# Patient Record
Sex: Female | Born: 1950 | Race: Black or African American | Hispanic: No | Marital: Married | Smoking: Never smoker
Health system: Southern US, Community
[De-identification: ages and names within clinical notes are randomized; demographics above are authoritative.]

## PROBLEM LIST (undated history)

## (undated) DIAGNOSIS — E1169 Type 2 diabetes mellitus with other specified complication: Secondary | ICD-10-CM

## (undated) DIAGNOSIS — I639 Cerebral infarction, unspecified: Secondary | ICD-10-CM

## (undated) DIAGNOSIS — I493 Ventricular premature depolarization: Secondary | ICD-10-CM

## (undated) DIAGNOSIS — I2510 Atherosclerotic heart disease of native coronary artery without angina pectoris: Secondary | ICD-10-CM

## (undated) DIAGNOSIS — N189 Chronic kidney disease, unspecified: Secondary | ICD-10-CM

## (undated) DIAGNOSIS — I10 Essential (primary) hypertension: Secondary | ICD-10-CM

## (undated) DIAGNOSIS — E669 Obesity, unspecified: Secondary | ICD-10-CM

## (undated) DIAGNOSIS — I4729 Other ventricular tachycardia: Secondary | ICD-10-CM

## (undated) DIAGNOSIS — E785 Hyperlipidemia, unspecified: Secondary | ICD-10-CM

## (undated) DIAGNOSIS — Z9861 Coronary angioplasty status: Secondary | ICD-10-CM

## (undated) DIAGNOSIS — K219 Gastro-esophageal reflux disease without esophagitis: Secondary | ICD-10-CM

## (undated) DIAGNOSIS — IMO0001 Reserved for inherently not codable concepts without codable children: Secondary | ICD-10-CM

## (undated) DIAGNOSIS — I209 Angina pectoris, unspecified: Secondary | ICD-10-CM

## (undated) DIAGNOSIS — I472 Ventricular tachycardia: Secondary | ICD-10-CM

## (undated) DIAGNOSIS — I2119 ST elevation (STEMI) myocardial infarction involving other coronary artery of inferior wall: Secondary | ICD-10-CM

## (undated) DIAGNOSIS — R0601 Orthopnea: Secondary | ICD-10-CM

## (undated) DIAGNOSIS — D219 Benign neoplasm of connective and other soft tissue, unspecified: Secondary | ICD-10-CM

## (undated) HISTORY — DX: Ventricular premature depolarization: I49.3

## (undated) HISTORY — PX: CHOLECYSTECTOMY: SHX55

## (undated) HISTORY — DX: Obesity, unspecified: E66.9

## (undated) HISTORY — DX: Hyperlipidemia, unspecified: E78.5

## (undated) HISTORY — DX: Other ventricular tachycardia: I47.29

## (undated) HISTORY — DX: Type 2 diabetes mellitus with other specified complication: E11.69

## (undated) HISTORY — PX: ABDOMINAL HYSTERECTOMY: SHX81

## (undated) HISTORY — DX: Ventricular tachycardia: I47.2

## (undated) HISTORY — DX: ST elevation (STEMI) myocardial infarction involving other coronary artery of inferior wall: I21.19

---

## 2004-12-02 ENCOUNTER — Ambulatory Visit: Payer: Self-pay | Admitting: Family Medicine

## 2005-11-24 ENCOUNTER — Ambulatory Visit: Payer: Self-pay | Admitting: Internal Medicine

## 2006-01-08 ENCOUNTER — Ambulatory Visit: Payer: Self-pay | Admitting: Internal Medicine

## 2006-11-04 ENCOUNTER — Ambulatory Visit: Payer: Self-pay | Admitting: Internal Medicine

## 2007-03-01 ENCOUNTER — Ambulatory Visit: Payer: Self-pay | Admitting: Internal Medicine

## 2007-04-22 ENCOUNTER — Ambulatory Visit: Payer: Self-pay | Admitting: Internal Medicine

## 2007-04-27 ENCOUNTER — Emergency Department: Payer: Self-pay | Admitting: Emergency Medicine

## 2008-05-01 ENCOUNTER — Ambulatory Visit: Payer: Self-pay | Admitting: Unknown Physician Specialty

## 2008-05-24 ENCOUNTER — Ambulatory Visit: Payer: Self-pay | Admitting: Unknown Physician Specialty

## 2009-03-18 ENCOUNTER — Observation Stay: Payer: Self-pay | Admitting: Internal Medicine

## 2009-07-23 ENCOUNTER — Ambulatory Visit: Payer: Self-pay | Admitting: Internal Medicine

## 2009-09-28 ENCOUNTER — Ambulatory Visit: Payer: Self-pay | Admitting: Surgery

## 2009-10-15 ENCOUNTER — Ambulatory Visit: Payer: Self-pay | Admitting: Surgery

## 2010-07-12 ENCOUNTER — Encounter: Payer: Self-pay | Admitting: Cardiothoracic Surgery

## 2010-08-07 ENCOUNTER — Encounter: Payer: Self-pay | Admitting: Cardiothoracic Surgery

## 2011-07-22 ENCOUNTER — Ambulatory Visit: Payer: Self-pay | Admitting: Family Medicine

## 2012-08-13 ENCOUNTER — Ambulatory Visit: Payer: Self-pay | Admitting: Family Medicine

## 2013-03-19 ENCOUNTER — Inpatient Hospital Stay (HOSPITAL_COMMUNITY)
Admission: EM | Admit: 2013-03-19 | Discharge: 2013-03-21 | DRG: 247 | Disposition: A | Discharge status: Home / Self Care | Payer: 59 | Attending: Cardiovascular Disease | Admitting: Cardiovascular Disease

## 2013-03-19 ENCOUNTER — Encounter (HOSPITAL_COMMUNITY)
Admission: EM | Disposition: A | Discharge status: Home / Self Care | Payer: 59 | Source: Home / Self Care | Attending: Cardiovascular Disease

## 2013-03-19 ENCOUNTER — Encounter (HOSPITAL_COMMUNITY): Payer: Self-pay | Admitting: Emergency Medicine

## 2013-03-19 ENCOUNTER — Emergency Department: Payer: Self-pay | Admitting: Emergency Medicine

## 2013-03-19 DIAGNOSIS — E119 Type 2 diabetes mellitus without complications: Secondary | ICD-10-CM | POA: Diagnosis present

## 2013-03-19 DIAGNOSIS — I2510 Atherosclerotic heart disease of native coronary artery without angina pectoris: Secondary | ICD-10-CM

## 2013-03-19 DIAGNOSIS — I2119 ST elevation (STEMI) myocardial infarction involving other coronary artery of inferior wall: Secondary | ICD-10-CM

## 2013-03-19 DIAGNOSIS — E669 Obesity, unspecified: Secondary | ICD-10-CM | POA: Diagnosis present

## 2013-03-19 DIAGNOSIS — I10 Essential (primary) hypertension: Secondary | ICD-10-CM | POA: Diagnosis present

## 2013-03-19 DIAGNOSIS — Z9861 Coronary angioplasty status: Secondary | ICD-10-CM

## 2013-03-19 DIAGNOSIS — I219 Acute myocardial infarction, unspecified: Secondary | ICD-10-CM

## 2013-03-19 DIAGNOSIS — I213 ST elevation (STEMI) myocardial infarction of unspecified site: Secondary | ICD-10-CM

## 2013-03-19 DIAGNOSIS — I214 Non-ST elevation (NSTEMI) myocardial infarction: Secondary | ICD-10-CM | POA: Insufficient documentation

## 2013-03-19 DIAGNOSIS — E876 Hypokalemia: Secondary | ICD-10-CM | POA: Diagnosis not present

## 2013-03-19 DIAGNOSIS — Z8673 Personal history of transient ischemic attack (TIA), and cerebral infarction without residual deficits: Secondary | ICD-10-CM

## 2013-03-19 DIAGNOSIS — E785 Hyperlipidemia, unspecified: Secondary | ICD-10-CM | POA: Diagnosis present

## 2013-03-19 DIAGNOSIS — Z79899 Other long term (current) drug therapy: Secondary | ICD-10-CM

## 2013-03-19 HISTORY — DX: Essential (primary) hypertension: I10

## 2013-03-19 HISTORY — DX: Atherosclerotic heart disease of native coronary artery without angina pectoris: I25.10

## 2013-03-19 HISTORY — DX: Cerebral infarction, unspecified: I63.9

## 2013-03-19 HISTORY — DX: Coronary angioplasty status: Z98.61

## 2013-03-19 HISTORY — DX: ST elevation (STEMI) myocardial infarction involving other coronary artery of inferior wall: I21.19

## 2013-03-19 HISTORY — PX: PERCUTANEOUS CORONARY STENT INTERVENTION (PCI-S): SHX6016

## 2013-03-19 HISTORY — DX: Atherosclerotic heart disease of native coronary artery without angina pectoris: Z98.61

## 2013-03-19 HISTORY — PX: LEFT HEART CATHETERIZATION WITH CORONARY ANGIOGRAM: SHX5451

## 2013-03-19 HISTORY — DX: Obesity, unspecified: E66.9

## 2013-03-19 LAB — COMPREHENSIVE METABOLIC PANEL
ALT: 26 U/L (ref 12–78)
ALT: 21 U/L (ref 0–35)
AST: 42 U/L — ABNORMAL HIGH (ref 0–37)
Albumin: 4 g/dL (ref 3.4–5.0)
Albumin: 3.9 g/dL (ref 3.5–5.2)
Alkaline Phosphatase: 103 U/L
Alkaline Phosphatase: 95 U/L (ref 39–117)
Anion Gap: 7 (ref 7–16)
BUN: 15 mg/dL (ref 7–18)
BUN: 14 mg/dL (ref 6–23)
Bilirubin,Total: 0.4 mg/dL (ref 0.2–1.0)
CHLORIDE: 105 mmol/L (ref 98–107)
CHLORIDE: 102 meq/L (ref 96–112)
CO2: 28 mmol/L (ref 21–32)
CO2: 25 mEq/L (ref 19–32)
Calcium, Total: 9 mg/dL (ref 8.5–10.1)
Calcium: 9.4 mg/dL (ref 8.4–10.5)
Creatinine, Ser: 0.88 mg/dL (ref 0.50–1.10)
Creatinine: 1.05 mg/dL (ref 0.60–1.30)
EGFR (African American): >60
GFR CALC NON AF AMER: 57 — AB
GFR calc non Af Amer: 69 mL/min — ABNORMAL LOW (ref >90)
GFR, EST AFRICAN AMERICAN: 81 mL/min — AB (ref >90)
Glucose, Bld: 155 mg/dL — ABNORMAL HIGH (ref 70–99)
Glucose: 151 mg/dL — ABNORMAL HIGH (ref 65–99)
Osmolality: 283 (ref 275–301)
POTASSIUM: 3.4 mmol/L — AB (ref 3.5–5.1)
POTASSIUM: 3.8 meq/L (ref 3.7–5.3)
SGOT(AST): 19 U/L (ref 15–37)
Sodium: 140 mmol/L (ref 136–145)
Sodium: 140 mEq/L (ref 137–147)
Total Bilirubin: 0.3 mg/dL (ref 0.3–1.2)
Total Protein: 8.1 g/dL (ref 6.4–8.2)
Total Protein: 7.4 g/dL (ref 6.0–8.3)

## 2013-03-19 LAB — CBC
HCT: 44.6 % (ref 35.0–47.0)
HCT: 41.3 % (ref 36.0–46.0)
HEMOGLOBIN: 14.6 g/dL (ref 12.0–15.0)
HGB: 15.2 g/dL (ref 12.0–16.0)
MCH: 28.7 pg (ref 26.0–34.0)
MCH: 29.1 pg (ref 26.0–34.0)
MCHC: 34 g/dL (ref 32.0–36.0)
MCHC: 35.4 g/dL (ref 30.0–36.0)
MCV: 84 fL (ref 80–100)
MCV: 82.4 fL (ref 78.0–100.0)
Platelet: 226 10*3/uL (ref 150–440)
Platelets: 238 10*3/uL (ref 150–400)
RBC: 5.29 10*6/uL — AB (ref 3.80–5.20)
RBC: 5.01 MIL/uL (ref 3.87–5.11)
RDW: 14.2 % (ref 11.5–14.5)
RDW: 13.5 % (ref 11.5–15.5)
WBC: 9.7 10*3/uL (ref 3.6–11.0)
WBC: 9.6 10*3/uL (ref 4.0–10.5)

## 2013-03-19 LAB — I-STAT CHEM 8, ED
BUN: 14 mg/dL (ref 6–23)
CALCIUM ION: 1.14 mmol/L (ref 1.13–1.30)
Chloride: 103 mEq/L (ref 96–112)
Creatinine, Ser: 1 mg/dL (ref 0.50–1.10)
Glucose, Bld: 152 mg/dL — ABNORMAL HIGH (ref 70–99)
HCT: 46 % (ref 36.0–46.0)
Hemoglobin: 15.6 g/dL — ABNORMAL HIGH (ref 12.0–15.0)
Potassium: 3.7 mEq/L (ref 3.7–5.3)
Sodium: 141 mEq/L (ref 137–147)
TCO2: 25 mmol/L (ref 0–100)

## 2013-03-19 LAB — PROTIME-INR
INR: 1.02 (ref 0.00–1.49)
Prothrombin Time: 13.2 seconds (ref 11.6–15.2)

## 2013-03-19 LAB — I-STAT TROPONIN, ED: TROPONIN I, POC: 1.47 ng/mL — AB (ref 0.00–0.08)

## 2013-03-19 LAB — TSH: TSH: 0.444 u[IU]/mL (ref 0.350–4.500)

## 2013-03-19 LAB — TROPONIN I
Troponin I: >20 ng/mL (ref <0.30)
Troponin-I: 0.05 ng/mL

## 2013-03-19 LAB — HEMOGLOBIN A1C
Hgb A1c MFr Bld: 6.5 % — ABNORMAL HIGH (ref <5.7)
Mean Plasma Glucose: 140 mg/dL — ABNORMAL HIGH (ref <117)

## 2013-03-19 LAB — MAGNESIUM: Magnesium: 2.1 mg/dL (ref 1.5–2.5)

## 2013-03-19 LAB — MRSA PCR SCREENING: MRSA by PCR: NEGATIVE

## 2013-03-19 LAB — T4, FREE: Free T4: 1.2 ng/dL (ref 0.80–1.80)

## 2013-03-19 LAB — APTT: APTT: 82 s — AB (ref 24–37)

## 2013-03-19 SURGERY — LEFT HEART CATHETERIZATION WITH CORONARY ANGIOGRAM

## 2013-03-19 MED ORDER — HEPARIN (PORCINE) IN NACL 2-0.9 UNIT/ML-% IJ SOLN
INTRAMUSCULAR | Status: AC
  Filled 2013-03-19: qty 1000

## 2013-03-19 MED ORDER — TICAGRELOR 90 MG PO TABS
90.0000 mg | ORAL_TABLET | Freq: Two times a day (BID) | ORAL | Status: DC
  Administered 2013-03-19 – 2013-03-21 (×4): 90 mg via ORAL
  Filled 2013-03-19 (×6): qty 1

## 2013-03-19 MED ORDER — SODIUM CHLORIDE 0.9 % IV SOLN
INTRAVENOUS | Status: DC
  Administered 2013-03-19: 20 mL/h via INTRAVENOUS

## 2013-03-19 MED ORDER — ASPIRIN 81 MG PO CHEW
81.0000 mg | CHEWABLE_TABLET | Freq: Every day | ORAL | Status: DC
  Administered 2013-03-20 – 2013-03-21 (×2): 81 mg via ORAL
  Filled 2013-03-19 (×2): qty 1

## 2013-03-19 MED ORDER — NITROGLYCERIN 0.2 MG/ML ON CALL CATH LAB
INTRAVENOUS | Status: AC
  Filled 2013-03-19: qty 1

## 2013-03-19 MED ORDER — ATORVASTATIN CALCIUM 80 MG PO TABS
80.0000 mg | ORAL_TABLET | Freq: Every day | ORAL | Status: DC
  Administered 2013-03-19 – 2013-03-20 (×2): 80 mg via ORAL
  Filled 2013-03-19 (×3): qty 1

## 2013-03-19 MED ORDER — LIDOCAINE HCL (PF) 1 % IJ SOLN
INTRAMUSCULAR | Status: AC
  Filled 2013-03-19: qty 30

## 2013-03-19 MED ORDER — TICAGRELOR 90 MG PO TABS
ORAL_TABLET | ORAL | Status: AC
  Filled 2013-03-19: qty 1

## 2013-03-19 MED ORDER — HEPARIN SODIUM (PORCINE) 1000 UNIT/ML IJ SOLN
INTRAMUSCULAR | Status: AC
  Filled 2013-03-19: qty 1

## 2013-03-19 MED ORDER — NITROGLYCERIN 0.4 MG SL SUBL: 0.4000 mg | SUBLINGUAL_TABLET | SUBLINGUAL | Status: DC | PRN

## 2013-03-19 MED ORDER — BIVALIRUDIN 250 MG IV SOLR
INTRAVENOUS | Status: AC
  Filled 2013-03-19: qty 250

## 2013-03-19 MED ORDER — SODIUM CHLORIDE 0.9 % IV SOLN
1.7500 mg/kg/h | INTRAVENOUS | Status: DC
  Filled 2013-03-19: qty 250

## 2013-03-19 MED ORDER — VERAPAMIL HCL 2.5 MG/ML IV SOLN
INTRAVENOUS | Status: AC
  Filled 2013-03-19: qty 2

## 2013-03-19 MED ORDER — SODIUM CHLORIDE 0.9 % IJ SOLN
3.0000 mL | INTRAMUSCULAR | Status: DC | PRN
  Administered 2013-03-21: 3 mL via INTRAVENOUS

## 2013-03-19 MED ORDER — ASPIRIN EC 81 MG PO TBEC: 81.0000 mg | DELAYED_RELEASE_TABLET | Freq: Every day | ORAL | Status: DC

## 2013-03-19 MED ORDER — SODIUM CHLORIDE 0.9 % IV SOLN: 250.0000 mL | INTRAVENOUS | Status: DC | PRN

## 2013-03-19 MED ORDER — SODIUM CHLORIDE 0.9 % IJ SOLN
3.0000 mL | Freq: Two times a day (BID) | INTRAMUSCULAR | Status: DC
  Administered 2013-03-19 – 2013-03-21 (×4): 3 mL via INTRAVENOUS

## 2013-03-19 MED ORDER — HEPARIN (PORCINE) IN NACL 2-0.9 UNIT/ML-% IJ SOLN
INTRAMUSCULAR | Status: AC
  Filled 2013-03-19: qty 500

## 2013-03-19 MED ORDER — SODIUM CHLORIDE 0.9 % IV SOLN
0.2500 mg/kg/h | INTRAVENOUS | Status: AC
  Filled 2013-03-19: qty 250

## 2013-03-19 MED ORDER — SODIUM CHLORIDE 0.9 % IV SOLN: INTRAVENOUS | Status: AC

## 2013-03-19 MED ORDER — MORPHINE SULFATE 2 MG/ML IJ SOLN
2.0000 mg | INTRAMUSCULAR | Status: DC | PRN
  Administered 2013-03-19: 2 mg via INTRAVENOUS
  Filled 2013-03-19: qty 1

## 2013-03-19 NOTE — Progress Notes (Signed)
Chaplain met with family and escorted them to be with pt in tra-A.  Family was spending time with pt before pt was moved to cath lab.  Chaplain was with other family so could not escort to cath lab.

## 2013-03-19 NOTE — ED Notes (Signed)
Alvino Chapel, MD aware of abnormal lab test results

## 2013-03-19 NOTE — ED Provider Notes (Signed)
CSN: [id number]     Arrival date & time 03/19/13  0845 History   First MD Initiated Contact with Patient 03/19/13 [phone]     Chief Complaint  Patient presents with  . Code STEMI     (Consider location/radiation/quality/duration/timing/severity/associated sxs/prior Treatment) The history is provided by the patient.   patient was transferred from [facility] as a code STEMI. The cath lab is not available immediately, so patient was taken to the ED. She's had pain since last night. Initial EKG showed an inferior ST elevation. Patient was given nitroglycerin and heparin bolus, along with aspirin and [place]. Her pain is much improved from 9/10-2/10. It is pressure on her mid chest. Previous history of stroke. No blood in the stool. No headache. No confusion. No numbness or weakness.  Past Medical History  Diagnosis Date  . Stroke   . Hypertension    History reviewed. No pertinent past surgical history. History reviewed. No pertinent family history. History  Substance Use Topics  . Smoking status: Never Smoker   . Smokeless tobacco: Not on file  . Alcohol Use: No   OB History   Grav Para Term Preterm Abortions TAB SAB Ect Mult Living                 Review of Systems  Respiratory: Negative for shortness of breath.   Cardiovascular: Positive for chest pain.  Gastrointestinal: Negative for blood in stool.  Musculoskeletal: Negative for back pain.  Skin: Negative for rash and wound.      Allergies  Review of patient's allergies indicates not on file.  Home Medications  No current outpatient prescriptions on file. BP 142/85  Pulse 75  Temp(Src) 98.2 F (36.8 C) (Oral)  Resp 16  Ht 5\' 7"  (1.702 m)  Wt 235 lb (106.595 kg)  BMI 36.80 kg/m2  SpO2 98% Physical Exam  Constitutional: She is oriented to person, place, and time. She appears well-developed.  Patient is obese  HENT:  Head: Normocephalic.  Cardiovascular: Normal rate and regular rhythm.   Pulmonary/Chest:  Effort normal and breath sounds normal.  Abdominal: There is no tenderness.  Musculoskeletal: She exhibits no edema.  Neurological: She is alert and oriented to person, place, and time.  Skin: Skin is warm.    ED Course  Procedures (including critical care time) Labs Review Labs Reviewed  APTT  CBC  COMPREHENSIVE METABOLIC PANEL  [place], ED   Imaging Review No results found.   EKG Interpretation   Date/Time:  Saturday March 19 2013 08:53:57 EDT Ventricular Rate:  73 PR Interval:  171 QRS Duration: 96 QT Interval:  420 QTC Calculation: 463 R Axis:   31 Text Interpretation:  Sinus rhythm Borderline T abnormalities, lateral  leads mild ST elevation in III Confirmed by Tammatha Cobb  MD, Teva Bronkema [phone])  on 03/19/2013 9:01:59 AM      MDM   Final diagnoses:  STEMI (ST elevation myocardial infarction)    Patient with chest pain. Pain is improved from previous and EKG is improving. Previous EKGs reviewed. Discussed with Dr. Gwenlyn Found, and patient will still be taken to the Cath Lab. Troponin is elevated.    Jasper Riling. Alvino Chapel, MD 03/19/13 1021

## 2013-03-19 NOTE — ED Notes (Signed)
Dr Berry at bedside.   

## 2013-03-19 NOTE — CV Procedure (Addendum)
Yvonne Shaw is a 63 y.o. female    [id number] LOCATION:  FACILITY: [facility]  PHYSICIAN: Quay Burow, M.D. 1951/01/07   DATE OF PROCEDURE:  03/19/2013  DATE OF DISCHARGE:     CARDIAC CATHETERIZATION     History obtained from chart review.Yvonne Shaw is a 63 y.o. female w/ no prior cardiac history, PMHx s/f h/o CVA, HTN, HLD and obesity who is admitted to [facility] today for STEMI.  She reports being in her USOH until ~ 9PM last night when she began experiencing intermittent substernal chest burning w/o radiation or associated symptoms. She was not initially concerned and attributed this to indigestion. She went to sleep and was awoken at 6AM by the same discomfort which had increased to a 9/10 in intensity and with associated nausea. She got up and tried to busy herself around the house, however the discomfort persisted. She alerted her husband who called 911.  Serial EKG tracings in the field reveal fluctuating ST segments (59mm elevation to isoelectric) in leads III, aVF. She was transported initially to [facility] ED. Code STEMI was called. She did receive ASA x 4 and heparin bolus. She was diverted to [facility] ED for emergent cardiac catheterization.     PROCEDURE DESCRIPTION:   The patient was brought to the second floor [place] Cardiac cath lab in the postabsorptive state. She was not premedicated . Her right wristwas prepped and shaved in usual sterile fashion. Xylocaine 1% was used for local anesthesia. A 6 French sheath was inserted into the right radial artery using standard Seldinger technique. The patient received 4000 units  of heparin  intravenously.  A 5 [place] TIG catheter and pigtail catheter were used for selective coronary angiography and left ventriculography respectively. Visipaque dye was used for the entirety of the case. Retrograde aortic, left ventricular and pullback pressures were recorded.    HEMODYNAMICS:    AO SYSTOLIC/AO DIASTOLIC: 169/67   LV  SYSTOLIC/LV DIASTOLIC: 893/81  ANGIOGRAPHIC RESULTS:   1. Left main; normal  2. LAD; normal 3. Left circumflex; nondominant with a 90% ulcerated plaque in the AV groove circumflex. There was a large ramus branch arising from the very proximal circumflex that was free of significant disease.  4. Right coronary artery; dominant and normal 5. Left ventriculography; RAO left ventriculogram was performed using  25 mL of Visipaque dye at 12 mL/second. The overall LVEF estimated  50 %  With wall motion abnormalities with mild global hypokinesia and inferior hypokinesia  IMPRESSION:Ms. Kimoto had an aborted inferolateral myocardial infarction (STEMI ) related to a nondominant AV groove circumflex subtending several posterolateral branches. The remainder of her coronary arteries were free of significant disease. She does have TIMI 3 flow presently but has mild residual chest pain. We will proceed with PCI and stenting using drug-eluting stent, Angiomax and Brilenta.  Procedure description: The patient received Angiomax bolus with an ACT of 492. Total contrast administered the patient was 200 cc. The patient had a short left main and multiple guide catheters were tried most of which went down the LAD beyond the takeoff of the circumflex. Ultimately an FL 3.0 guide catheter was used along with a 14/190 cm long [place]  guide wire and a 2 mm x 12 mm long balloon for predilatation. I then was able to stent the disease segment with a 2.25 mm x 15 mm long Xpedition  drug-eluting stent at 18 atmospheres (2.46 mm) resulting in reduction of a 90% ulcerated mid AV groove circumflex to  0% residual with excellent flow. The patient tolerated the procedure well.  Final impression: Successful non primary  PCI and stenting of MID AV groove circumflex using drug-eluting stent in the setting of reported inferolateral myocardial infarction. The remainder of her coronary anatomy if free of significant disease. She has mild LV  dysfunction. The sheath was removed and a TR band was placed on the right wrist to achieve patent hemostasis. The patient left the Cath Lab in stable condition. She will be "short tract". She'll be treated with dual antibiotic therapy including aspirin and Brilenta as well as beta blocker, statin drugs and ACE inhibitor.  Lorretta Harp MD, [facility] 03/19/2013 10:58 AM

## 2013-03-19 NOTE — ED Notes (Signed)
Family at bedside. Pt on zoll pads. Clothes off and bagged, given to family. Waiting for cath team

## 2013-03-19 NOTE — ED Notes (Signed)
Cardiology PA at bedside. Pt stopped in ED due to request of Cath Lab

## 2013-03-19 NOTE — H&P (Signed)
History and Physical   Patient ID: Yvonne Shaw MRN: [id number], DOB/AGE: 1950/07/17   Admit date: 03/19/2013 Date of Consult: 03/19/2013   Primary Physician: No primary provider on file. Primary Cardiologist: New  HPI: Yvonne Shaw is a 63 y.o. female w/ no prior cardiac history, PMHx s/f h/o CVA, HTN, HLD and obesity who is admitted to [facility] today for STEMI.   She reports being in her USOH until ~ 9PM last night when she began experiencing intermittent substernal chest burning w/o radiation or associated symptoms. She was not initially concerned and attributed this to indigestion. She went to sleep and was awoken at 6AM by the same discomfort which had increased to a 9/10 in intensity and with associated nausea. She got up and tried to busy herself around the house, however the discomfort persisted. She alerted her husband who called 911.   Serial EKG tracings in the field reveal fluctuating ST segments (48mm elevation to isoelectric) in leads III, aVF. She was transported initially to [facility] ED. Code STEMI was called. She did receive ASA x 4 and heparin bolus. She was diverted to [facility] ED for emergent cardiac catheterization.   On my exam, she is awake, alert and in NAD. Chest discomfort is currently 3/10. Vital signs stable.   Problem List: Past Medical History  Diagnosis Date  . CVA (cerebral infarction)     Residual R leg weakness  . Hypertension   . Hyperlipidemia   . Obesity     Past Surgical History  Procedure Laterality Date  . Abdominal hysterectomy    . Cholecystectomy       Allergies: No known drug allergies  Home Medications: Prior to Admission medications   Not on File    Inpatient Medications:    No prescriptions prior to admission   Per patient: "I take clonidine, amlodipine and lisinopril for high blood pressure."  History reviewed. She denies first degree family history of cardiovascular disease.    History   Social History  . Marital  Status: Married    Spouse Name: N/A    Number of Children: N/A  . Years of Education: N/A   Occupational History  . Not on file.   Social History Main Topics  . Smoking status: Never Smoker   . Smokeless tobacco: Not on file  . Alcohol Use: No  . Drug Use: No  . Sexual Activity: Not on file   Other Topics Concern  . Not on file   Social History Narrative   Married, 2 healthy children. Lives in [place], [place]. Works at [employer].     Review of Systems:  Limited due to acuity of situation.   Cardiovascular: positive for chest pain Abdominal: positive for nausea  Physical Exam: Blood pressure 179/89, pulse 84, temperature 98.2 F (36.8 C), temperature source Oral, resp. rate 20, height 5\' 7"  (1.702 m), weight 106.595 kg (235 lb), SpO2 98.00%.    General: Well developed, obese-appearing female, in no acute distress. Head: Normocephalic, atraumatic, sclera non-icteric, no xanthomas, nares are without discharge.  Neck: Negative for carotid bruits. JVD not elevated. Lungs: Clear bilaterally to auscultation without wheezes, rales, or rhonchi. Breathing is unlabored. Heart:  RRR with S1 S2. No murmurs, rubs, or gallops appreciated. Abdomen: Soft, non-tender, non-distended with normoactive bowel sounds. No hepatomegaly. No rebound/guarding. No obvious abdominal masses. Msk:  Strength and tone appears normal for age. Extremities: No clubbing, cyanosis or edema.  Distal pedal pulses are 2+ and equal bilaterally. Neuro: Alert  and oriented X 3. Moves all extremities spontaneously. Psych:  Responds to questions appropriately with a normal affect.  Labs: Recent Labs     03/19/13  0928  HGB  15.6*  HCT  46.0    Recent Labs Lab 03/19/13 0928  NA 141  K 3.7  CL 103  BUN 14  CREATININE 1.00  GLUCOSE 152*   Radiology/Studies: No results found.  EKG: NSR, frequent PVCs, bigeminy, NSVT, intermittent 2 mm ST elevation III, aVF  ASSESSMENT AND PLAN:   1. Inferior STEMI 2.  Hypertension 3. Hyperlipidemia 4. H/o CVA 5. Obesity  The patient presents with initially intermittent substernal chest burning beginning last night and reaching maximal intensity this AM at a 9/10 w/ associated nausea. She has no prior cardiac history, but risk factors include HTN, HLD, age and obesity. Objectively, EKG does reveal intermittent ST elevations III, aVF likely corresponding to ruptured plaque with intermittent occlusion and patency. This would also correlate with the initial waxing and waning nature of the patient's discomfort. She is currently on the cath lab table.   Will plan to place admission orders to CCU. Start low-dose ASA. Leave P2Y12 inhibitor to interventionalist's discretion. Note, she has a h/o CVA. Consider Brilinta or Plavix over Effient. Defer on beta blocker for now given inferior MI (normotensive, not bradycardic at present). Start high-potency statin. Will check lipid panel, Hgb A1C, TSH, free T4, Mg and cycle enzymes.   Further recommendations per interventionalist's findings.   Signed, R. Valeria Batman, PA-C 03/19/2013, 9:40 AM   Agree with admission H&P by Valeria Batman PA-C. Patient was transferred from [facility] with chest pain. Her EKG showed inferolateral ST elevation. She was treated with aspirin and IV heparin. Her pain somewhat improved on arrival to the cath lab it was elected to perform cardiac catheterization via the right radial approach and potential intervention.  Lorretta Harp, M.D., [place], [facility], [place] [address] [place], [place]  [address]  [phone] 03/19/2013 11:41 AM

## 2013-03-19 NOTE — ED Notes (Signed)
Pt here from [facility] for stemi, started having pain last pm thought was indegestion, this am had continued pain and called ems, seen at [place] and noted to be stemi and sent here for eval. Received 324 mg asa and 4000 units heparin pta to ED here.

## 2013-03-19 NOTE — Progress Notes (Signed)
[id number] Cardiac Rehab Pt still having some chest discomfort,so did not ambulate pt. Completed MI and stent education with pt. I gave her information on Brilinta.She agrees to [facility]. CRP in [place], will send referral. Deon Pilling, RN 03/19/2013 3:37 PM   .

## 2013-03-20 DIAGNOSIS — E785 Hyperlipidemia, unspecified: Secondary | ICD-10-CM | POA: Insufficient documentation

## 2013-03-20 DIAGNOSIS — I10 Essential (primary) hypertension: Secondary | ICD-10-CM | POA: Diagnosis present

## 2013-03-20 DIAGNOSIS — E876 Hypokalemia: Secondary | ICD-10-CM | POA: Diagnosis not present

## 2013-03-20 DIAGNOSIS — E119 Type 2 diabetes mellitus without complications: Secondary | ICD-10-CM | POA: Diagnosis not present

## 2013-03-20 LAB — CBC
HEMATOCRIT: 39.1 % (ref 36.0–46.0)
Hemoglobin: 13.4 g/dL (ref 12.0–15.0)
MCH: 28.6 pg (ref 26.0–34.0)
MCHC: 34.3 g/dL (ref 30.0–36.0)
MCV: 83.4 fL (ref 78.0–100.0)
Platelets: 233 10*3/uL (ref 150–400)
RBC: 4.69 MIL/uL (ref 3.87–5.11)
RDW: 14.1 % (ref 11.5–15.5)
WBC: 12.2 10*3/uL — ABNORMAL HIGH (ref 4.0–10.5)

## 2013-03-20 LAB — LIPID PANEL
Cholesterol: 187 mg/dL (ref 0–200)
HDL: 46 mg/dL (ref >39)
LDL Cholesterol: 122 mg/dL — ABNORMAL HIGH (ref 0–99)
Total CHOL/HDL Ratio: 4.1 ratio
Triglycerides: 95 mg/dL (ref <150)
VLDL: 19 mg/dL (ref 0–40)

## 2013-03-20 LAB — BASIC METABOLIC PANEL
BUN: 19 mg/dL (ref 6–23)
CO2: 22 mEq/L (ref 19–32)
CREATININE: 1.18 mg/dL — AB (ref 0.50–1.10)
Calcium: 9 mg/dL (ref 8.4–10.5)
Chloride: 103 mEq/L (ref 96–112)
GFR calc Af Amer: 56 mL/min — ABNORMAL LOW (ref >90)
GFR calc non Af Amer: 49 mL/min — ABNORMAL LOW (ref >90)
GLUCOSE: 116 mg/dL — AB (ref 70–99)
Potassium: 3.3 mEq/L — ABNORMAL LOW (ref 3.7–5.3)
Sodium: 140 mEq/L (ref 137–147)

## 2013-03-20 LAB — TROPONIN I
TROPONIN I: 13.77 ng/mL — AB (ref <0.30)
Troponin I: 12.86 ng/mL (ref <0.30)
Troponin I: 11.61 ng/mL (ref <0.30)

## 2013-03-20 LAB — GLUCOSE, CAPILLARY
Glucose-Capillary: 111 mg/dL — ABNORMAL HIGH (ref 70–99)
Glucose-Capillary: 104 mg/dL — ABNORMAL HIGH (ref 70–99)

## 2013-03-20 MED ORDER — POTASSIUM CHLORIDE CRYS ER 20 MEQ PO TBCR
40.0000 meq | EXTENDED_RELEASE_TABLET | Freq: Once | ORAL | Status: AC
  Administered 2013-03-20: 40 meq via ORAL
  Filled 2013-03-20: qty 2

## 2013-03-20 MED ORDER — METOPROLOL TARTRATE 12.5 MG HALF TABLET
12.5000 mg | ORAL_TABLET | Freq: Two times a day (BID) | ORAL | Status: DC
  Administered 2013-03-20 – 2013-03-21 (×3): 12.5 mg via ORAL
  Filled 2013-03-20 (×4): qty 1

## 2013-03-20 MED ORDER — INSULIN ASPART 100 UNIT/ML ~~LOC~~ SOLN
0.0000 [IU] | Freq: Three times a day (TID) | SUBCUTANEOUS | Status: DC
  Administered 2013-03-21: 2 [IU] via SUBCUTANEOUS

## 2013-03-20 NOTE — Progress Notes (Addendum)
SUBJECTIVE:  Doing well no further CP  OBJECTIVE:   Vitals:   Filed Vitals:   03/20/13 0400 03/20/13 0500 03/20/13 0600 03/20/13 0700  BP:      Pulse: 64 80 57 86  Temp:      TempSrc:      Resp: 26 16    Height:      Weight:    237 lb 10.5 oz (107.8 kg)  SpO2: 95% 98% 97% 99%   I&O's:   Intake/Output Summary (Last 24 hours) at 03/20/13 0741 Last data filed at 03/19/13 2200  Gross per 24 hour  Intake     60 ml  Output   1050 ml  Net   -990 ml   TELEMETRY: Reviewed telemetry pt in NSR:     PHYSICAL EXAM General: Well developed, well nourished, in no acute distress Head: Eyes PERRLA, No xanthomas.   Normal cephalic and atramatic  Lungs:   Clear bilaterally to auscultation and percussion. Heart:   HRRR S1 S2 Pulses are 2+ & equal. Abdomen: Bowel sounds are positive, abdomen soft and non-tender without masses Extremities:   No clubbing, cyanosis or edema.  DP +1 Neuro: Alert and oriented X 3. Psych:  Good affect, responds appropriately   LABS: Basic Metabolic Panel:  Recent Labs  03/19/13 0905 03/19/13 0928 03/19/13 1547 03/20/13 0332  NA 140 141  --  140  K 3.8 3.7  --  3.3*  CL 102 103  --  103  CO2 25  --   --  22  GLUCOSE 155* 152*  --  116*  BUN 14 14  --  19  CREATININE 0.88 1.00  --  1.18*  CALCIUM 9.4  --   --  9.0  MG  --   --  2.1  --    Liver Function Tests:  Recent Labs  03/19/13 0905  AST 42*  ALT 21  ALKPHOS 95  BILITOT 0.3  PROT 7.4  ALBUMIN 3.9   No results found for this basename: LIPASE, AMYLASE,  in the last 72 hours CBC:  Recent Labs  03/19/13 0905 03/19/13 0928 03/20/13 0332  WBC 9.6  --  12.2*  HGB 14.6 15.6* 13.4  HCT 41.3 46.0 39.1  MCV 82.4  --  83.4  PLT 238  --  233   Cardiac Enzymes:  Recent Labs  03/19/13 1547 03/19/13 1830 03/20/13 0014  TROPONINI >20.00* >20.00* >20.00*   BNP: No components found with this basename: POCBNP,  D-Dimer: No results found for this basename: DDIMER,  in the last 72  hours Hemoglobin A1C:  Recent Labs  03/19/13 1547  HGBA1C 6.5*   Fasting Lipid Panel:  Recent Labs  03/20/13 0332  CHOL 187  HDL 46  LDLCALC 122*  TRIG 95  CHOLHDL 4.1   Thyroid Function Tests:  Recent Labs  03/19/13 1547  TSH 0.444   Anemia Panel: No results found for this basename: VITAMINB12, FOLATE, FERRITIN, TIBC, IRON, RETICCTPCT,  in the last 72 hours Coag Panel:   Lab Results  Component Value Date   INR 1.02 03/19/2013    RADIOLOGY: No results found.  ASSESSMENT AND PLAN:  1. Inferior STEMI s/p cath showing 90% ulcerated plaque in the AV groove left circ and EF 50% s/p PCI stent with DES - continue ASA/statin/Brilinta - Add low dose beta blocker - hold on ACE I for now due to BP in 110's  (she was on Lisinopril 20mg  at home) 2. Hypertension - well controlled -  will hold on home BP meds for now while starting low dose beta blocker 3. Hyperlipidemia  - continus statin - increased atorva to 80mg  due to LDL>70 4. H/o CVA  5. Obesity 6.  Hypokalemia - replete 7.  Newly diagnosed DM with HbgA1C at 6.5 - will cover BS for now with SS and consider adding Metformin prior to d/c (just had contrast exposure)  Stable from cardiac standpoint to transfer to tele bed with cardiac rehab  Sueanne Margarita, MD  03/20/2013  7:41 AM

## 2013-03-21 ENCOUNTER — Encounter (HOSPITAL_COMMUNITY): Payer: Self-pay | Admitting: Cardiology

## 2013-03-21 DIAGNOSIS — E785 Hyperlipidemia, unspecified: Secondary | ICD-10-CM

## 2013-03-21 DIAGNOSIS — I219 Acute myocardial infarction, unspecified: Secondary | ICD-10-CM

## 2013-03-21 DIAGNOSIS — E119 Type 2 diabetes mellitus without complications: Secondary | ICD-10-CM

## 2013-03-21 DIAGNOSIS — I10 Essential (primary) hypertension: Secondary | ICD-10-CM

## 2013-03-21 DIAGNOSIS — I2119 ST elevation (STEMI) myocardial infarction involving other coronary artery of inferior wall: Secondary | ICD-10-CM

## 2013-03-21 LAB — BASIC METABOLIC PANEL
BUN: 26 mg/dL — AB (ref 6–23)
CHLORIDE: 104 meq/L (ref 96–112)
CO2: 21 mEq/L (ref 19–32)
Calcium: 9 mg/dL (ref 8.4–10.5)
Creatinine, Ser: 1.16 mg/dL — ABNORMAL HIGH (ref 0.50–1.10)
GFR calc Af Amer: 58 mL/min — ABNORMAL LOW (ref >90)
GFR calc non Af Amer: 50 mL/min — ABNORMAL LOW (ref >90)
GLUCOSE: 119 mg/dL — AB (ref 70–99)
POTASSIUM: 3.8 meq/L (ref 3.7–5.3)
Sodium: 140 mEq/L (ref 137–147)

## 2013-03-21 LAB — GLUCOSE, CAPILLARY
GLUCOSE-CAPILLARY: 128 mg/dL — AB (ref 70–99)
GLUCOSE-CAPILLARY: 76 mg/dL (ref 70–99)
Glucose-Capillary: 129 mg/dL — ABNORMAL HIGH (ref 70–99)

## 2013-03-21 LAB — TROPONIN I: TROPONIN I: 6.77 ng/mL — AB (ref <0.30)

## 2013-03-21 LAB — POCT ACTIVATED CLOTTING TIME: Activated Clotting Time: 492 seconds

## 2013-03-21 MED ORDER — NITROGLYCERIN 0.4 MG SL SUBL
0.4000 mg | SUBLINGUAL_TABLET | SUBLINGUAL | Status: DC | PRN
Start: 2013-03-21 — End: 2021-01-08

## 2013-03-21 MED ORDER — ONETOUCH ULTRA SYSTEM W/DEVICE KIT: 1.0000 | PACK | Freq: Once | Status: AC

## 2013-03-21 MED ORDER — METOPROLOL TARTRATE 12.5 MG HALF TABLET
12.5000 mg | ORAL_TABLET | Freq: Once | ORAL | Status: AC
  Administered 2013-03-21: 12.5 mg via ORAL
  Filled 2013-03-21: qty 1

## 2013-03-21 MED ORDER — TICAGRELOR 90 MG PO TABS: 90.0000 mg | ORAL_TABLET | Freq: Two times a day (BID) | ORAL | Status: DC

## 2013-03-21 MED ORDER — ATORVASTATIN CALCIUM 80 MG PO TABS: 80.0000 mg | ORAL_TABLET | Freq: Every day | ORAL | Status: DC

## 2013-03-21 MED ORDER — LIVING WELL WITH DIABETES BOOK
Freq: Once | Status: AC
  Administered 2013-03-21: 15:00:00
  Filled 2013-03-21: qty 1

## 2013-03-21 MED ORDER — METOPROLOL TARTRATE 25 MG PO TABS: 25.0000 mg | ORAL_TABLET | Freq: Two times a day (BID) | ORAL | Status: DC

## 2013-03-21 MED ORDER — METFORMIN HCL 500 MG PO TABS: 500.0000 mg | ORAL_TABLET | Freq: Two times a day (BID) | ORAL | Status: DC

## 2013-03-21 MED FILL — Sodium Chloride IV Soln 0.9%: INTRAVENOUS | Qty: 50 | Status: AC

## 2013-03-21 NOTE — Progress Notes (Signed)
Pt Profile:  63 y.o. AA female w/ no prior cardiac history, PMHx s/f h/o CVA, HTN, HLD and obesity who was admitted to [facility] on 03/19/13 for STEMI. Day 2 s/p emergent PCI  + DES to mid AV groove circumflex. EF of 50% on cath. On DAPT with ASA + Brilinta, a BB and statin therapy. Hgb A1C of 6.5 suggest a new diagnosis of DM. BG currently being treated with SS insulin.   Subjective: Denies CP/SOB.   Objective: Vital signs in last 24 hours: Temp:  [97.6 F (36.4 C)-98.6 F (37 C)] 98.6 F (37 C) (03/16 0529) Pulse Rate:  [71-78] 73 (03/16 0529) Resp:  [14-16] 16 (03/16 0529) BP: (107-143)/(61-79) 107/61 mmHg (03/16 0529) SpO2:  [97 %-100 %] 97 % (03/16 0529) Last BM Date: 03/17/13  Intake/Output from previous day: 03/15 0701 - 03/16 0700 In: 240 [P.O.:240] Out: -  Intake/Output this shift:    Medications Current Facility-Administered Medications  Medication Dose Route Frequency Provider Last Rate Last Dose  . 0.9 %  sodium chloride infusion   Intravenous Continuous Jasper Riling. Alvino Chapel, MD   20 mL/hr at 03/19/13 0913  . 0.9 %  sodium chloride infusion  250 mL Intravenous PRN Roger A Arguello, PA-C      . aspirin chewable tablet 81 mg  81 mg Oral Daily Lorretta Harp, MD   81 mg at 03/20/13 0908  . atorvastatin (LIPITOR) tablet 80 mg  80 mg Oral q1800 Roger A Arguello, PA-C   80 mg at 03/20/13 1639  . insulin aspart (novoLOG) injection 0-15 Units  0-15 Units Subcutaneous TID WC Sueanne Margarita, MD      . metoprolol tartrate (LOPRESSOR) tablet 12.5 mg  12.5 mg Oral BID Sueanne Margarita, MD   12.5 mg at 03/20/13 2154  . morphine 2 MG/ML injection 2 mg  2 mg Intravenous Q1H PRN Lorretta Harp, MD   2 mg at 03/19/13 1529  . nitroGLYCERIN (NITROSTAT) SL tablet 0.4 mg  0.4 mg Sublingual Q5 Min x 3 PRN Roger A Arguello, PA-C      . sodium chloride 0.9 % injection 3 mL  3 mL Intravenous Q12H Roger A Arguello, PA-C   3 mL at 03/20/13 2155  . sodium chloride 0.9 % injection 3 mL  3 mL  Intravenous PRN Roger A Arguello, PA-C      . Ticagrelor (BRILINTA) tablet 90 mg  90 mg Oral BID Lorretta Harp, MD   90 mg at 03/20/13 2154    PE: General appearance: alert, cooperative and no distress Lungs: clear to auscultation bilaterally Heart: regular rate and rhythm, S1, S2 normal, no murmur, click, rub or gallop Extremities: no LEE Pulses: 2+ and symmetric Skin: warm and dry Neurologic: Grossly normal  Lab Results:   Recent Labs  03/19/13 0905 03/19/13 0928 03/20/13 0332  WBC 9.6  --  12.2*  HGB 14.6 15.6* 13.4  HCT 41.3 46.0 39.1  PLT 238  --  233   BMET  Recent Labs  03/19/13 0905 03/19/13 0928 03/20/13 0332 03/21/13 0510  NA 140 141 140 140  K 3.8 3.7 3.3* 3.8  CL 102 103 103 104  CO2 25  --  22 21  GLUCOSE 155* 152* 116* 119*  BUN 14 14 19  26*  CREATININE 0.88 1.00 1.18* 1.16*  CALCIUM 9.4  --  9.0 9.0   PT/INR  Recent Labs  03/19/13 0905  LABPROT 13.2  INR 1.02   Cholesterol  Recent  Labs  03/20/13 0332  CHOL 187   Cardiac Panel (last 3 results)  Recent Labs  03/20/13 1120 03/20/13 1519 03/20/13 2145  TROPONINI 12.86* 11.61* 13.77*      Assessment/Plan  Active Problems:   STEMI - s/p DES to mid AV groove Circ 03/19/13; EF 50% on cath   HTN (hypertension)   Dyslipidemia   Diabetes mellitus   HLD (hyperlipidemia)   Plan: 63 y.o. female w/ no prior cardiac history, PMHx s/f h/o CVA, HTN, HLD and obesity who was admitted to [facility] on 03/19/13 for STEMI. Day 2 s/p emergent PCI  + DES to mid AV groove circumflex. EF of 50% on cath. On DAPT with ASA + Brilinta, a BB and statin therapy. Hgb A1C of 6.5 suggest a new diagnosis of DM. BG currently being treated with SS insulin.   1. CAD/ S/P STEMI:  Denies resting CP/SOB. Hasn't ambulated the halls yet. Will consult cardiac rehab to ambulate today to ensure she has no exertional CP or SOB. Continue DAPT with ASA + Brilinta. She will need a 30 day Brilinta card. Will check on  status with Case Management today. Also continue BB and statin. Will wait to initiate ACE/ARB due to soft BP and slightly elevated SCr (1.16).   2. Hypertension: Well controlled. Resume Lopressor, 12.5 mg BID.   3. Diabetes: Hgb A1c of 6.5. Blood glucose elevated this am at 119. Continue SS insulin. Would recommend Metformin therapy. However, will wait until tomorrow given she just had contrast dye exposure during cath. She will need to f/u with PCP for further management.   4. Hyperlipidemia:  Elevated LDL of 122. Resume high dose statin. 80 mg of Lipitor daily. LDL goal <70.   Dispo: possibly discharge home later today or tomorrow.   MD to follow with further recommendations.    LOS: 2 days    Brittainy M. Rosita Fire, PA-C 03/21/2013 7:13 AM

## 2013-03-21 NOTE — Progress Notes (Addendum)
CARDIAC REHAB PHASE I   PRE:  Rate/Rhythm: 24 SB  BP:  Supine:   Sitting: 110/60  Standing:    SaO2: 98 RA  MODE:  Ambulation: 460 ft   POST:  Rate/Rhythm: 71  BP:  Supine:   Sitting: 120/70  Standing:    SaO2: 99 RA 1100-1200 Pt tolerated ambulation well without c/o of cp or SOB. Pt tired by end of walk. VS stable Pt to side of bed after walk. Reveiwed education with pt. And also discussed diabetic diet. She voices understanding. Encouraged pt to watch diabetic and MI videos.    Rodney Langton RN 03/21/2013 12:39 PM

## 2013-03-21 NOTE — Discharge Summary (Signed)
Physician Discharge Summary  Patient ID: ANAMAE ROCHELLE MRN: [id number] DOB/AGE: [birth year] 63 y.o.  Admit date: 03/19/2013 Discharge date: 03/21/2013  Admission Diagnoses: STEMI   Discharge Diagnoses:  Active Problems:   STEMI - s/p DES to mid AV groove Circ 03/19/13; EF 50% on cath   HTN (hypertension)   Diabetes mellitus   HLD (hyperlipidemia)   Discharged Condition: stable  Hospital Course: the patient  is a 63 y.o. AA female w/ no prior cardiac history, PMHx s/f h/o CVA, HTN, HLD and obesity who was admitted to [facility] on 03/19/13 for STEMI. Prior to admission, she developed severe 9/10 substernal chest pain with associated nausea, prompting her to call 911. Serial EKG tracings in the field reveal fluctuating ST segments (75m elevation to isoelectric) in leads III, aVF. Initial troponin was >20.00. [facility]. She did receive ASA x 4 and heparin bolus. [facility] The procedure was performed by Dr. BGwenlyn Found via the right radial artery. She was found to have a 90% ulcerated plaque in the AV groove circumflex. This was successfully treated with PCI utilizing a DES. The remainder of her coronary arteries were free of significant disease. She had mild LV dysfunction. The overall LVEF was estimated at 50 %, with wall motion abnormalities with mild global hypokinesia and inferior hypokinesia. She tolerated the procedure well.  She left the cath lab in stable condition. Her chest pain resolved. She was placed on DAPT with ASA + Brilinta. She was also started on 12.5 mg of Lopressor BID as well as high dose statin therapy with 80 mg of Lipitor daily. A fasting lipid panel on day 1 post procedure revealed an elevated LDL of 122. A Hgb A1c was also obtained and was elevated at 6.5, establishing the diagnosis of type 2 diabetes mellitus. However, it was decided to withhold initiation of  Metformin until >48 hours post cath. In the interim, her elevated blood glucose levels were managed with sliding scale insulin. Cardiac rehab was consulted to assist with phase one rehab. She had no exertional chest pain or dyspnea. Her right radial artery remained stable. On hospital day 2, her Lopressor was further increased to 25 mg daily. Her HR and BP tolerated the adjustment and she had no symptoms of dizziness or fatigue. It should be noted that her troponins were noted to decrease then slightly increase again from >20 --> 12.89 -->11.61, back up to 13.77. This was in the absence of recurrent CP and EKG changes. Her troponin was reassessed prior to discharge and was trending back downward to 6.77.  On hospital day 2, she was seen by Dr. HEllyn Hack who determined she was stable for discharge home. She was provided a 30 day Brilinta card. In addition to Brilinta, she was also ordered to resume ASA, Lopressor and Lipitor. Metformin, 500 mg BID, was ordered to be started with meals on the day after discharge. Per diabetes coordinator, it was also recommended that she be given an order for a home blood glucose monitoring kit. She was instructed to f/u with her PCP for further monitoring/ management of her diabetes. She is scheduled for 2 week post hospital f/u with BLyda Jester PA-C, on 04/05/13 and again with Dr. BGwenlyn Foundon 05/04/13.    Consults: None  Significant Diagnostic Studies:   Emergent LHC 03/19/13 HEMODYNAMICS:  AO SYSTOLIC/AO DIASTOLIC: 1638/93 LV SYSTOLIC/LV DIASTOLIC: 1734/28 ANGIOGRAPHIC RESULTS:  1. Left main; normal  2. LAD; normal  3. Left circumflex; nondominant with a 90% ulcerated plaque in the AV groove circumflex. There was a large ramus branch arising from the very proximal circumflex that was free of significant disease.  4. Right coronary artery; dominant and normal  5. Left ventriculography; RAO left ventriculogram was performed using  25 mL of Visipaque dye at 12  mL/second. The overall LVEF estimated  50 % With wall motion abnormalities with mild global hypokinesia and inferior hypokinesia   Treatments: See Hospital Course  Discharge Exam: Blood pressure 125/58, pulse 65, temperature 97.5 F (36.4 C), temperature source Oral, resp. rate 16, height 5' 7" (1.702 m), weight 235 lb 8 oz (106.822 kg), SpO2 97.00%.   Disposition: Final discharge disposition not confirmed      Discharge Orders   Future Appointments Provider Department Dept Phone   04/05/2013 9:00 AM Brittainy Ladoris [facility] [phone]   05/04/2013 9:15 AM Lorretta Harp, MD [facility] [phone]   Future Orders Complete By Expires   Diet - low sodium heart healthy  As directed    Increase activity slowly  As directed        Medication List    STOP taking these medications       amLODipine 10 MG tablet  Commonly known as:  NORVASC     cloNIDine 0.2 MG tablet  Commonly known as:  CATAPRES     hydrochlorothiazide 25 MG tablet  Commonly known as:  HYDRODIURIL     lisinopril 20 MG tablet  Commonly known as:  PRINIVIL,ZESTRIL     omega-3 acid ethyl esters 1 G capsule  Commonly known as:  LOVAZA      TAKE these medications       aspirin 81 MG tablet  Take 81 mg by mouth daily.     atorvastatin 80 MG tablet  Commonly known as:  LIPITOR  Take 1 tablet (80 mg total) by mouth daily at 6 PM.     metFORMIN 500 MG tablet  Commonly known as:  GLUCOPHAGE  Take 1 tablet (500 mg total) by mouth 2 (two) times daily with a meal.  Start taking on:  03/22/2013     metoprolol tartrate 25 MG tablet  Commonly known as:  LOPRESSOR  Take 1 tablet (25 mg total) by mouth 2 (two) times daily.     nitroGLYCERIN 0.4 MG SL tablet  Commonly known as:  NITROSTAT  Place 1 tablet (0.4 mg total) under the tongue every 5 (five) minutes x 3 doses as needed for chest pain.     ONE TOUCH ULTRA SYSTEM KIT W/DEVICE Kit  1 kit by Does not apply route  once.     Ticagrelor 90 MG Tabs tablet  Commonly known as:  BRILINTA  Take 1 tablet (90 mg total) by mouth 2 (two) times daily.     Ticagrelor 90 MG Tabs tablet  Commonly known as:  BRILINTA  Take 1 tablet (90 mg total) by mouth 2 (two) times daily.     vitamin B-12 500 MCG tablet  Commonly known as:  CYANOCOBALAMIN  Take 500 mcg by mouth daily.     vitamin C 500 MG tablet  Commonly known as:  ASCORBIC ACID  Take 500 mg by mouth daily.       Follow-up Information   Follow up with Lyda Jester, PA-C On 04/05/2013. (9:00 am )    Specialty:  Cardiology   Contact information:  [address] [place] [address] [phone]       Follow up with Lorretta Harp, MD On 05/04/2013. (9:15 am )    Specialty:  Cardiology   Contact information:   [address] [place] [address] [phone]      TIME SPENT ON DISCHARGE, INCLUDING PHYSICIAN TIME: >30 MINUTES  Signed: Lyda Jester 03/21/2013, 4:35 PM

## 2013-03-21 NOTE — Progress Notes (Signed)
Inpatient Diabetes Program Recommendations  AACE/ADA: New Consensus Statement on Inpatient Glycemic Control (2013)  Target Ranges:  Prepandial:   less than 140 mg/dL      Peak postprandial:   less than 180 mg/dL (1-2 hours)      Critically ill patients:  140 - 180 mg/dL   Reason for Visit: Diabetes Coordinator Referral-- New onset diabetes   Diabetes history: New-onset diabetes Outpatient Diabetes medications: None Current orders for Inpatient glycemic control: To start Metformin after discharge  Note:  Patient processing STEMI plus diagnosis of diabetes.  Has watched one video regarding diabetes.  Gave her a DVD regarding basic diabetes education to take home with her as she is hopeful for discharge this afternoon.  Also gave her a brochure regarding outpatient diabetes support online through [place] offered by [employer].   Encouraged her to follow-up soon with her PCP, Eulogio Bear, FNP.  Discussed the action and effect of Metformin.  Answered her questions regarding possible other diabetes medications.     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  Printed a Training and development officer to [employer] for her to complete so she can receive diabetes education through the [employer].  She will be able to get her  meter, medications, etc for zero co-pay through this program once she has attended classes.  Request MD give patient a Rx for a glucose meter, strips, and lancets should she need to obtain prior to getting one through [facility].  (Husband has diabetes and has meter, so she can probably use his until able to get one on her own.)  Thank you.  Fenix Ruppe S. Marcelline Mates, RN, CNS, CDE Inpatient Diabetes Program, team pager [phone]

## 2013-03-21 NOTE — Progress Notes (Signed)
DC orders received.  Patient stable with no S/S of distress.  Medication and discharge information reviewed with patient and patient's husband.  Patient DC home with husband. Michaelanthony Kempton Marie  

## 2013-03-21 NOTE — Progress Notes (Addendum)
I have seen and evaluated the patient this PM along with Ellen Henri, PA. I agree with her findings, examination as well as impression recommendations.  Admitted AM of 3/14 for stuttering ACS Sx with intermitted If STE --> Emergent cath revealed Cx lesion--> PCI with Xience Alpine DES 2.25 mm x 15 mm post dilated to 2.46 mm.  Has been stable since PCI.  Up with [facility] today.   Glycemic control has been stable =-> agree with low dose Metformin no d/c.  She is stable for Fast-track d/c in AM provided she remains stable, but would like to go home this PM.  ON DAPT, statin, BB - will titrate to 25 mg bid prior to d/c.    Plan:  Will give a 1 x dose of 12.5 mg (in addition to AM dose) now & check a final Troponin level.  Would like to see troponin trend down & stable on higher BB dose. If she is able to ambulate this PM without difficulties (hypotension, orthostasis, angina), I would feel comfortable with early discharge this PM.  Will reassess by ~3-4 pm after labs checked & meds given -- if stable, OK to D/c.    Leonie Man, M.D., M.S. Interventional Cardiologist  [place] Pager # [phone] 03/21/2013

## 2013-03-21 NOTE — Discharge Summary (Signed)
Pt. Seen & examined on rounds today - doing well post NSTEMI.    Tolerated increased BB dose this AM -- ready for d/c this PM - per request as opposed to tomorrow AM.  No arrhythmia or recurrent Sx of Angina or CHF.    Preserved EF.   Agree with d/c summary.  Leonie Man, MD Leonie Man, M.D., M.S. Interventional Cardiologist  [place] Pager # [phone] 03/21/2013

## 2013-03-21 NOTE — Progress Notes (Signed)
UR Completed Peirce Deveney Swor-Bigelow, RN,BSN [phone]  

## 2013-03-21 NOTE — Care Management Note (Addendum)
    Page 1 of 1   03/21/2013     12:42:10 PM   CARE MANAGEMENT NOTE 03/21/2013  Patient:  Yvonne Shaw, Yvonne Shaw   Account Number:  [id number]  Date Initiated:  03/21/2013  Documentation initiated by:  Hott-BIGELOW,Grainne Knights  Subjective/Objective Assessment:   Pt admitted for [facility].  S/p cath with stent. Plan for d/c on brilinta.     Action/Plan:   CM did provide pt with 30 day free/ discount card. Pt will need a Rx for 30 day free brilinta no refills. CM did call [place] to see if medication is in stock and it is available.   Anticipated DC Date:  03/21/2013   Anticipated DC Plan:  [name]  CM consult      Choice offered to / List presented to:             Status of service:  Completed, signed off Medicare Important Message given?   (If response is "NO", the following Medicare IM given date fields will be blank) Date Medicare IM given:   Date Additional Medicare IM given:    Discharge Disposition:  HOME/SELF CARE  Per UR Regulation:  Reviewed for med. necessity/level of care/duration of stay  If discussed at [place] of Stay Meetings, dates discussed:    Comments:

## 2013-03-28 ENCOUNTER — Telehealth: Payer: Self-pay | Admitting: Cardiovascular Disease

## 2013-03-28 NOTE — Telephone Encounter (Signed)
Spoke to patient. She states she has been short of breath since her cath.  At rest and activity. She states it is worse at night. She has to sleep on 2 pillows and that is new. She states she is unable to way herself, no scale at home. No signs of edema,no cough per patient.  Patient states she has her FMLA paper.  RN  Informed patient will move appointment to 03/29/13 at 9:40 am from 04/05/13,if sypmtoms become worse go to  ER. Patient verbalized understanding

## 2013-03-28 NOTE — Telephone Encounter (Signed)
Pt had Cath on 3-14. Pt is now having SOB,would you please call. She wants to know also if she can pick her FMLA papers when she comes for her appt to see [place] on 3-31 please.

## 2013-03-28 NOTE — Telephone Encounter (Signed)
Good  Plan

## 2013-03-29 ENCOUNTER — Encounter: Payer: Self-pay | Admitting: Cardiology

## 2013-03-29 ENCOUNTER — Ambulatory Visit (INDEPENDENT_AMBULATORY_CARE_PROVIDER_SITE_OTHER): Payer: 59 | Admitting: Cardiology

## 2013-03-29 VITALS — BP 118/68 | HR 47 | Ht 67.0 in | Wt 241.0 lb

## 2013-03-29 DIAGNOSIS — I2129 ST elevation (STEMI) myocardial infarction involving other sites: Secondary | ICD-10-CM

## 2013-03-29 DIAGNOSIS — E785 Hyperlipidemia, unspecified: Secondary | ICD-10-CM

## 2013-03-29 DIAGNOSIS — I10 Essential (primary) hypertension: Secondary | ICD-10-CM

## 2013-03-29 DIAGNOSIS — I498 Other specified cardiac arrhythmias: Secondary | ICD-10-CM

## 2013-03-29 DIAGNOSIS — R0602 Shortness of breath: Secondary | ICD-10-CM

## 2013-03-29 DIAGNOSIS — I219 Acute myocardial infarction, unspecified: Secondary | ICD-10-CM

## 2013-03-29 DIAGNOSIS — I2121 ST elevation (STEMI) myocardial infarction involving left circumflex coronary artery: Secondary | ICD-10-CM

## 2013-03-29 DIAGNOSIS — I213 ST elevation (STEMI) myocardial infarction of unspecified site: Secondary | ICD-10-CM

## 2013-03-29 DIAGNOSIS — E119 Type 2 diabetes mellitus without complications: Secondary | ICD-10-CM

## 2013-03-29 DIAGNOSIS — R001 Bradycardia, unspecified: Secondary | ICD-10-CM

## 2013-03-29 MED ORDER — HYDROCHLOROTHIAZIDE 12.5 MG PO CAPS: 12.5000 mg | ORAL_CAPSULE | Freq: Every day | ORAL | Status: DC

## 2013-03-29 MED ORDER — METOPROLOL TARTRATE 25 MG PO TABS: 12.5000 mg | ORAL_TABLET | Freq: Two times a day (BID) | ORAL | Status: DC

## 2013-03-29 MED ORDER — LISINOPRIL 5 MG PO TABS: 5.0000 mg | ORAL_TABLET | Freq: Every day | ORAL | Status: DC

## 2013-03-29 NOTE — Progress Notes (Signed)
04/01/2013   PCP: Pcp Not In System   Chief Complaint  Patient presents with  . Follow-up    S/P Hospital visit  . Shortness of Breath  . Flu Vaccine    pt. states has gotten her Flu shot for this year    Primary Cardiologist:  Dr. Gwenlyn Found  HPI:  63 y.o. female w/ no prior cardiac history, PMHx s/f h/o CVA, HTN, HLD and obesity who was admitted to [facility] 03/19/13 for STEMI,ST segments (40mm elevation to isoelectric) in leads III, aVF.  On emergent cath she was found to have had an aborted inferolateral myocardial infarction (STEMI ) related to a nondominant AV groove circumflex subtending several posterolateral branches. She underwent successful non primary PCI and stenting of MID AV groove circumflex using drug-eluting stent.   She was also found to be diabetic and have dyslipidemia during the hospitalization.  Troponin Peaked at >20 but resolving by discharge.    Today she presents after calling in due to increasing SOB.  She has been SOB for several days. Increased when she lies down, now sleeping on 2 pillows.  Some SOB with walking.  No fever, no coughing, no wheezing and no chest pain.  Mild swelling.  Prior to hospitalization she was on HCTZ.  She is also on Brilinta which has a side effect of SOB.  Her HR today is 47.  No lightheadedness or dizziness.     Allergies  Allergen Reactions  . Aggrenox [Aspirin-Dipyridamole Er] Other (See Comments)    Severe headache    Current Outpatient Prescriptions  Medication Sig Dispense Refill  . aspirin 81 MG tablet Take 81 mg by mouth daily.      [name] atorvastatin (LIPITOR) 80 MG tablet Take 1 tablet (80 mg total) by mouth daily at 6 PM.  30 tablet  5  . Blood Glucose Monitoring Suppl (ONE TOUCH ULTRA SYSTEM KIT) W/DEVICE KIT 1 kit by Does not apply route once.  1 each  0  . metFORMIN (GLUCOPHAGE) 500 MG tablet Take 1 tablet (500 mg total) by mouth 2 (two) times daily with a meal.  60 tablet  5  . metoprolol tartrate  (LOPRESSOR) 25 MG tablet Take 0.5 tablets (12.5 mg total) by mouth 2 (two) times daily.  60 tablet  5  . nitroGLYCERIN (NITROSTAT) 0.4 MG SL tablet Place 1 tablet (0.4 mg total) under the tongue every 5 (five) minutes x 3 doses as needed for chest pain.  25 tablet  2  . Ticagrelor (BRILINTA) 90 MG TABS tablet Take 1 tablet (90 mg total) by mouth 2 (two) times daily.  60 tablet  10  . hydrochlorothiazide (MICROZIDE) 12.5 MG capsule Take 1 capsule (12.5 mg total) by mouth daily.  30 capsule  6  . lisinopril (PRINIVIL,ZESTRIL) 5 MG tablet Take 1 tablet (5 mg total) by mouth daily.  30 tablet  6   No current facility-administered medications for this visit.    Past Medical History  Diagnosis Date  . CVA (cerebral infarction)     Residual R leg weakness  . Hypertension   . Hyperlipidemia   . Obesity   . CAD S/P percutaneous coronary angioplasty 03/19/13    s/p 2.25 mm x 15 mm (2.46) DES to mid AV Groove Circumflex  . STEMI (ST elevation myocardial infarction) 03/19/13    s/p DES to mid AV Groove Circumflex; EF 50% on cath  . Diabetes mellitus     Past  Surgical History  Procedure Laterality Date  . Abdominal hysterectomy    . Cholecystectomy    . Percutaneous coronary stent intervention (pci-s)  03/19/2013    PCI to mCx - 90% lesion: 2.25 mm x 15 mm Xience Alpine DES (2.46 mm post-dilation); EF ~50%    PFX:TKWIOXB:DZ colds or fevers,  weight up 5 pounds from discharge Skin:no rashes or ulcers HEENT:no blurred vision, no congestion CV:see HPI PUL:see HPI GI:no diarrhea constipation or melena, no indigestion GU:no hematuria, no dysuria MS:no joint pain, no claudication, mild edema Neuro:no syncope, no lightheadedness Endo:New diabetes, no thyroid disease  PHYSICAL EXAM BP 118/68  Pulse 47  Ht $R'5\' 7"'fZ$  (1.702 m)  Wt 241 lb (109.317 kg)  BMI 37.74 kg/m2 General:Pleasant affect, NAD Skin:Warm and dry, brisk capillary refill HEENT:normocephalic, sclera clear, mucus membranes  moist Neck:supple, no JVD, no bruits  Heart:S1S2 RRR without murmur, gallup, rub or click Lungs: with bibasilar rales, no rhonchi, or wheezes HGD:JMEQ, non tender, + BS, do not palpate liver spleen or masses Ext:tr lower ext edema, 2+ pedal pulses, 2+ radial pulses Neuro:alert and oriented, MAE, follows commands, + facial symmetry  EKG:  SB rate 47 no acute changes.  ASSESSMENT AND PLAN Shortness of breath Volume overload off her previous HCTZ.  Will resume with 25 mg for 2 days then back to 12.5 mg daily thereafter.  If my Friday or Sat she is still SOB she will try drinking coffee with caffeine to see if it helps.  Brilinta may be adding to SOB.   Sinus bradycardia Decreased BB to 12.5 mg BID   HTN (hypertension) Resume lisinopril at 5 mg daily, for BP since I am decreasing BB.  STEMI - s/p DES to mid AV groove Circ 03/19/13; EF 50% on cath No chest pain.  On Brilinta and ASA  Dyslipidemia On statin will need lipid panel in 4-5 weeks.  Diabetes mellitus On metformin followed by PCP

## 2013-03-29 NOTE — Patient Instructions (Signed)
Decrease metoprolol to half a tab twice a day. (12.5 mg twice a day, begin this pm)  take HCTZ begin tomorrow, 2 tabs tomorrow and Thursday then once daily  Begin Lisinopril 5 mg daily on Thursday.  Call if shortness of breath continues.  Though if by Friday shortness of breath is not improved try drinking a cup of regular coffee to see if that helps.  Keep appt. With Ellen Henri, PA

## 2013-04-01 ENCOUNTER — Encounter: Payer: Self-pay | Admitting: Cardiology

## 2013-04-01 DIAGNOSIS — R001 Bradycardia, unspecified: Secondary | ICD-10-CM | POA: Insufficient documentation

## 2013-04-01 NOTE — Assessment & Plan Note (Signed)
No chest pain.  On Brilinta and ASA

## 2013-04-01 NOTE — Assessment & Plan Note (Addendum)
Volume overload off her previous HCTZ.  Will resume with 25 mg for 2 days then back to 12.5 mg daily thereafter.  If my Friday or Sat she is still SOB she will try drinking coffee with caffeine to see if it helps.  Brilinta may be adding to SOB.

## 2013-04-01 NOTE — Assessment & Plan Note (Signed)
Resume lisinopril at 5 mg daily, for BP since I am decreasing BB.

## 2013-04-01 NOTE — Assessment & Plan Note (Signed)
On metformin followed by PCP

## 2013-04-01 NOTE — Assessment & Plan Note (Signed)
Decreased BB to 12.5 mg BID

## 2013-04-01 NOTE — Assessment & Plan Note (Signed)
On statin will need lipid panel in 4-5 weeks.

## 2013-04-05 ENCOUNTER — Encounter: Payer: Self-pay | Admitting: Cardiology

## 2013-04-05 ENCOUNTER — Telehealth: Payer: Self-pay | Admitting: Cardiology

## 2013-04-05 ENCOUNTER — Ambulatory Visit (INDEPENDENT_AMBULATORY_CARE_PROVIDER_SITE_OTHER): Payer: 59 | Admitting: Cardiology

## 2013-04-05 VITALS — BP 140/80 | HR 46 | Ht 67.0 in | Wt 239.2 lb

## 2013-04-05 DIAGNOSIS — I213 ST elevation (STEMI) myocardial infarction of unspecified site: Secondary | ICD-10-CM

## 2013-04-05 DIAGNOSIS — E119 Type 2 diabetes mellitus without complications: Secondary | ICD-10-CM

## 2013-04-05 DIAGNOSIS — I2510 Atherosclerotic heart disease of native coronary artery without angina pectoris: Secondary | ICD-10-CM

## 2013-04-05 DIAGNOSIS — E785 Hyperlipidemia, unspecified: Secondary | ICD-10-CM

## 2013-04-05 DIAGNOSIS — I219 Acute myocardial infarction, unspecified: Secondary | ICD-10-CM

## 2013-04-05 DIAGNOSIS — I10 Essential (primary) hypertension: Secondary | ICD-10-CM

## 2013-04-05 MED ORDER — METOPROLOL TARTRATE 25 MG PO TABS: 12.5000 mg | ORAL_TABLET | Freq: Every morning | ORAL | Status: DC

## 2013-04-05 NOTE — Assessment & Plan Note (Signed)
Most recent Hgb A1c was 6.5. Continue Metformin, 500 mg BID. Pt instructed to f/u with PCP for continued monitoring.

## 2013-04-05 NOTE — Telephone Encounter (Signed)
Disability request form was completed today

## 2013-04-05 NOTE — Progress Notes (Signed)
Patient ID: Yvonne Shaw, female   DOB: 02-02-1950, 63 y.o.   MRN: [id number]    04/05/2013 Yvonne Shaw   1951-01-06  [id number]  Primary Physicia Pcp Not In System Primary Cardiologist: Dr. Gwenlyn Found  Yvonne Shaw reports to clinic for post hospital f/u, following an admission for STEMI.   HPI:  The patient is a 63 y.o. AA female w/ no prior cardiac history, PMHx s/f h/o CVA, HTN, HLD and obesity who was admitted to [facility] on 03/19/13 for STEMI. She was cathed by Dr. Gwenlyn Found and was found to have a 90% ulcerative plaque in the AV groove circunmflex. This was successfully treated with PCI utilizing a DES. She had mild LV dysfunction, with an estimated EF of 50%. She was placed on DAPT with ASA + Brilinta. She was also placed on 25 mg of Lopressor BID and 80 mg of Lipitor. 500 mg of Metformin BID was also intiated >48 hr post cath, as Hgb A1C suggested a new diagnosis of DM with a calculated level of 6.5.   Her original post hospital f/u was scheduled for today. However, she was acutally seen 4 days ago on 04/01/13 by Yvonne Kicks, NP, for evaluation of SOB. Physical exam revealed that she was slightly volume overloaded. It was discovered that her HCTZ was discontinued at time of discharge. Yvonne Shaw elected to restart her on HCTZ at 25 mg x 2 days, then resume 12.5 mg thereafter. She was also bradycardic at that time, with a HR of 49 bpm. Her Lopressor was decreased from 25 mg BID to 12.5 mg BID.  She presents back today for f/u. She states that her breathing has improved, however she continues to have intermittent episodes of SOB. It occurs both a rest and with exertion. The sensation only last a few seconds before resolving. There has been no weight gain and no LEE. She denies CP. No dizziness, fatigue, syncope or near syncope. She reports daily compliance with her medications.     Current Outpatient Prescriptions  Medication Sig Dispense Refill  . aspirin 81 MG tablet Take 81 mg by mouth daily.       [name] atorvastatin (LIPITOR) 80 MG tablet Take 1 tablet (80 mg total) by mouth daily at 6 PM.  30 tablet  5  . Blood Glucose Monitoring Suppl (ONE TOUCH ULTRA SYSTEM KIT) W/DEVICE KIT 1 kit by Does not apply route once.  1 each  0  . hydrochlorothiazide (MICROZIDE) 12.5 MG capsule Take 1 capsule (12.5 mg total) by mouth daily.  30 capsule  6  . lisinopril (PRINIVIL,ZESTRIL) 5 MG tablet Take 1 tablet (5 mg total) by mouth daily.  30 tablet  6  . metFORMIN (GLUCOPHAGE) 500 MG tablet Take 1 tablet (500 mg total) by mouth 2 (two) times daily with a meal.  60 tablet  5  . metoprolol tartrate (LOPRESSOR) 25 MG tablet Take 0.5 tablets (12.5 mg total) by mouth every morning.  60 tablet  5  . nitroGLYCERIN (NITROSTAT) 0.4 MG SL tablet Place 1 tablet (0.4 mg total) under the tongue every 5 (five) minutes x 3 doses as needed for chest pain.  25 tablet  2  . Ticagrelor (BRILINTA) 90 MG TABS tablet Take 1 tablet (90 mg total) by mouth 2 (two) times daily.  60 tablet  10   No current facility-administered medications for this visit.    Allergies  Allergen Reactions  . Aggrenox [Aspirin-Dipyridamole Er] Other (See Comments)    Severe headache  History   Social History  . Marital Status: Married    Spouse Name: N/A    Number of Children: N/A  . Years of Education: N/A   Occupational History  . Not on file.   Social History Main Topics  . Smoking status: Never Smoker   . Smokeless tobacco: Not on file  . Alcohol Use: No  . Drug Use: No  . Sexual Activity: Not on file   Other Topics Concern  . Not on file   Social History Narrative   Married, 2 healthy children. Lives in [place], [place]. Works at [employer].     Review of Systems: General: negative for chills, fever, night sweats or weight changes.  Cardiovascular: negative for chest pain, dyspnea on exertion, edema, orthopnea, palpitations, paroxysmal nocturnal dyspnea or shortness of breath Dermatological: negative for rash Respiratory: negative  for cough or wheezing Urologic: negative for hematuria Abdominal: negative for nausea, vomiting, diarrhea, bright red blood per rectum, melena, or hematemesis Neurologic: negative for visual changes, syncope, or dizziness All other systems reviewed and are otherwise negative except as noted above.    Blood pressure 140/80, pulse 46, height _0  (1.702 m), weight 239 lb 3.2 oz (108.5 kg).  General appearance: alert, cooperative and no distress Neck: no carotid bruit and no JVD Lungs: clear to auscultation bilaterally Heart: regular rate and rhythm, S1, S2 normal, no murmur, click, rub or gallop Extremities: no LEE Pulses: 2+ and symmetric Skin: warm and dry Neurologic: Grossly normal    ASSESSMENT AND PLAN:   STEMI - s/p DES to mid AV groove Circ 03/19/13; EF 50% on cath Denies further chest pain. Continue DAPT with ASA + Brilinta. Her SOB seems to be most related to her Brilinta. She is not volume overloaded on exam. She states that the SOB is not distressing and that it is tolerable. I informed her that Brilinta associated SOB usually resolves after 2-3 weeks of therapy. She has agreed to continue for now. If this continues to be bothersome for her, then we may consider switching to another antiplatelet. Continue BB. I have discussed with Dr. Gwenlyn Found. Her HR today is 46 bpm. We will decrease her Lopressor to 12.47m once a day. Continue statin.  HTN (hypertension) Controlled. Continue Lisinopril, HCTZ and Lopressor.    HLD (hyperlipidemia) Most recent lipid panel showed an elevated LDL of 122. Goal is <70. Continue on high dose statin therapy, Lipitor 80 mg.   Diabetes mellitus Most recent Hgb A1c was 6.5. Continue Metformin, 500 mg BID. Pt instructed to f/u with PCP for continued monitoring.    PLAN  Yvonne Shaw doing fairly well post STEMI. She continues to have mild intermittent SOB, which I think is likely due to Brilinta. However after discussion, we have decided to keep  her on Brilinta to see if dyspnea resolves over the next 1-2 weeks. If no improvement, then we may need to consider a different antiplatelet. We will also continue ASA, Lopressor (12.5 mg once daily due to bradycardia), lisinopril and Lipitor. She is scheduled to follow-up with Dr. BGwenlyn Foundon 05/04/13.   Bridget Westbrooks, BRITTAINYPA-C 04/05/2013 1:40 PM

## 2013-04-05 NOTE — Patient Instructions (Signed)
Cut back on Lopressor to just once a day. Take 12.5 mg in the am.  Continue to take all other meds as prescribed

## 2013-04-05 NOTE — Assessment & Plan Note (Signed)
Denies further chest pain. Continue DAPT with ASA + Brilinta. Her SOB seems to be most related to her Brilinta. She is not volume overloaded on exam. She states that the SOB is not distressing and that it is tolerable. I informed her that Brilinta associated SOB usually resolves after 2-3 weeks of therapy. She has agreed to continue for now. If this continues to be bothersome for her, then we may consider switching to another antiplatelet. Continue BB. I have discussed with Dr. Gwenlyn Found. Her HR today is 46 bpm. We will decrease her Lopressor to 12.5mg  once a day. Continue statin.

## 2013-04-05 NOTE — Assessment & Plan Note (Signed)
Controlled. Continue Lisinopril, HCTZ and Lopressor.

## 2013-04-05 NOTE — Assessment & Plan Note (Signed)
Most recent lipid panel showed an elevated LDL of 122. Goal is <70. Continue on high dose statin therapy, Lipitor 80 mg.

## 2013-04-06 ENCOUNTER — Telehealth: Payer: Self-pay | Admitting: *Deleted

## 2013-04-06 NOTE — Telephone Encounter (Signed)
Placed at front desk for patient to pick up.

## 2013-04-06 NOTE — Telephone Encounter (Signed)
Left message on voicemail to call back Want to inform disability papers are ready.

## 2013-04-07 ENCOUNTER — Telehealth: Payer: Self-pay | Admitting: *Deleted

## 2013-04-07 NOTE — Telephone Encounter (Signed)
Pt was returning Sharon's phone call from yesterday.

## 2013-04-07 NOTE — Telephone Encounter (Signed)
Patient called. RN informed patient that forms were available to be picked up . RN informed patient the forms were faxed to company yesterday. Patient states she will pick them up today.

## 2013-04-18 ENCOUNTER — Ambulatory Visit: Payer: Self-pay | Admitting: Family Medicine

## 2013-05-04 ENCOUNTER — Encounter: Payer: Self-pay | Admitting: Cardiovascular Disease

## 2013-05-04 ENCOUNTER — Ambulatory Visit (INDEPENDENT_AMBULATORY_CARE_PROVIDER_SITE_OTHER): Payer: 59 | Admitting: Cardiovascular Disease

## 2013-05-04 VITALS — BP 142/64 | HR 68 | Ht 67.0 in | Wt 234.0 lb

## 2013-05-04 DIAGNOSIS — I10 Essential (primary) hypertension: Secondary | ICD-10-CM

## 2013-05-04 DIAGNOSIS — I213 ST elevation (STEMI) myocardial infarction of unspecified site: Secondary | ICD-10-CM

## 2013-05-04 DIAGNOSIS — I219 Acute myocardial infarction, unspecified: Secondary | ICD-10-CM

## 2013-05-04 DIAGNOSIS — Z79899 Other long term (current) drug therapy: Secondary | ICD-10-CM

## 2013-05-04 DIAGNOSIS — E785 Hyperlipidemia, unspecified: Secondary | ICD-10-CM

## 2013-05-04 NOTE — Assessment & Plan Note (Signed)
On statin therapy. Her last lipid profile performed 03/20/13 revealed a social 27, LDL of 122, HDL 46. We will repeat recheck a lipid and liver profile

## 2013-05-04 NOTE — Assessment & Plan Note (Signed)
Six-week status post circumflex STEMI on 03/20/13. 50% with inferior hypokinesia. She was placed on dual antiplatelet therapy with aspirin and Brilenta. She does complain of some episodic shortness of breath and saw Ellen Henri, PA-C for this in the office. These symptoms have progressively improved over time.

## 2013-05-04 NOTE — Progress Notes (Signed)
05/04/2013 Yvonne Shaw   04/24/1950  [id number]  Primary Physician Pcp Not In System Primary Cardiologist: Lorretta Harp MD Renae Gloss   HPI:  Yvonne Shaw is a 63 y.o. female  Married [place] American female mother of 2, grandmother and 6 grandchildren he works as a Marine scientist at [employer].she had no prior cardiac history, PMHx s/f h/o CVA, HTN, HLD and obesity who was admitted to [facility] on 03/19/13 with an inferior STEMI. She reports being in her USOH until ~ 9PM last night when she began experiencing intermittent substernal chest burning w/o radiation or associated symptoms. She was not initially concerned and attributed this to indigestion. She went to sleep and was awoken at 6AM by the same discomfort which had increased to a 9/10 in intensity and with associated nausea. She got up and tried to busy herself around the house, however the discomfort persisted. She alerted her husband who called 911. Serial EKG tracings in the field reveal fluctuating ST segments (36mm elevation to isoelectric) in leads III, aVF. She was transported initially to [facility] ED. Code STEMI was called. She did receive ASA x 4 and heparin bolus. She was diverted to [facility] ED for emergent cardiac catheterization. 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 She was just been discharged 2 days later. Her only problems have been episodic shortness of breath probably related to the [place].    Current Outpatient Prescriptions  Medication Sig Dispense Refill  . aspirin 81 MG tablet Take 81 mg by mouth daily.      [name] atorvastatin (LIPITOR) 80 MG tablet Take 1 tablet (80 mg total) by mouth daily at 6 PM.  30 tablet  5  . Blood Glucose Monitoring Suppl (ONE TOUCH ULTRA SYSTEM KIT) W/DEVICE KIT 1 kit by Does not apply route once.  1 each  0  . hydrochlorothiazide (MICROZIDE) 12.5 MG capsule Take 1 capsule (12.5 mg total) by mouth daily.  30  capsule  6  . lisinopril (PRINIVIL,ZESTRIL) 5 MG tablet Take 1 tablet (5 mg total) by mouth daily.  30 tablet  6  . metFORMIN (GLUCOPHAGE) 500 MG tablet Take 1 tablet (500 mg total) by mouth 2 (two) times daily with a meal.  60 tablet  5  . metoprolol tartrate (LOPRESSOR) 25 MG tablet Take 0.5 tablets (12.5 mg total) by mouth every morning.  60 tablet  5  . nitroGLYCERIN (NITROSTAT) 0.4 MG SL tablet Place 1 tablet (0.4 mg total) under the tongue every 5 (five) minutes x 3 doses as needed for chest pain.  25 tablet  2  . Ticagrelor (BRILINTA) 90 MG TABS tablet Take 1 tablet (90 mg total) by mouth 2 (two) times daily.  60 tablet  10   No current facility-administered medications for this visit.    Allergies  Allergen Reactions  . Aggrenox [Aspirin-Dipyridamole Er] Other (See Comments)    Severe headache    History   Social History  . Marital Status: Married    Spouse Name: N/A    Number of Children: N/A  . Years of Education: N/A   Occupational History  . Not on file.   Social History Main Topics  . Smoking status: Never Smoker   . Smokeless tobacco: Not on file  . Alcohol Use: No  . Drug Use: No  . Sexual Activity: Not on file   Other Topics Concern  . Not on file  Social History Narrative   Married, 2 healthy children. Lives in [place], [place]. Works at [employer].     Review of Systems: General: negative for chills, fever, night sweats or weight changes.  Cardiovascular: negative for chest pain, dyspnea on exertion, edema, orthopnea, palpitations, paroxysmal nocturnal dyspnea or shortness of breath Dermatological: negative for rash Respiratory: negative for cough or wheezing Urologic: negative for hematuria Abdominal: negative for nausea, vomiting, diarrhea, bright red blood per rectum, melena, or hematemesis Neurologic: negative for visual changes, syncope, or dizziness All other systems reviewed and are otherwise negative except as noted above.    Blood pressure 142/64,  pulse 68, height $RemoveBe'5\' 7"'qWrOyZAVG$  (1.702 m), weight 234 lb (106.142 kg).  General appearance: alert and no distress Neck: no adenopathy, no carotid bruit, no JVD, supple, symmetrical, trachea midline and thyroid not enlarged, symmetric, no tenderness/mass/nodules Lungs: clear to auscultation bilaterally Heart: regular rate and rhythm, S1, S2 normal, no murmur, click, rub or gallop Extremities: extremities normal, atraumatic, no cyanosis or edema  EKG not performed today  ASSESSMENT AND PLAN:   STEMI - s/p DES to mid AV groove Circ 03/19/13; EF 50% on cath Six-week status post circumflex STEMI on 03/20/13. 50% with inferior hypokinesia. She was placed on dual antiplatelet therapy with aspirin and Brilenta. She does complain of some episodic shortness of breath and saw Ellen Henri, PA-C for this in the office. These symptoms have progressively improved over time.  HTN (hypertension) Controlled on current medications  HLD (hyperlipidemia) On statin therapy. Her last lipid profile performed 03/20/13 revealed a social 27, LDL of 122, HDL 46. We will repeat recheck a lipid and liver profile      Lorretta Harp MD [facility], [facility] 05/04/2013 10:33 AM

## 2013-05-04 NOTE — Assessment & Plan Note (Signed)
Controlled on current medications 

## 2013-05-04 NOTE — Patient Instructions (Signed)
We request that you follow-up in: 3 months with Brittainy and in 6 months with Dr Andria Rhein will receive a reminder letter in the mail two months in advance. If you don't receive a letter, please call our office to schedule the follow-up appointment.  Your physician recommends that you return for a FASTING lipid profile: 1 month

## 2013-05-06 ENCOUNTER — Ambulatory Visit: Payer: Self-pay | Admitting: Family Medicine

## 2013-05-10 ENCOUNTER — Telehealth: Payer: Self-pay | Admitting: Cardiovascular Disease

## 2013-05-10 NOTE — Telephone Encounter (Signed)
Dr Gwenlyn Found reviewed the chart and gave permission to return to work 5/11.  Letter drafted

## 2013-05-10 NOTE — Telephone Encounter (Signed)
Message forwarded to Kathryn, RN to discuss w/ Dr. Berry.  

## 2013-05-10 NOTE — Telephone Encounter (Signed)
Need letter to return to work on 5/11  Please call

## 2013-05-11 NOTE — Telephone Encounter (Signed)
Letter ready for pick up.  Letter will be at front desk. Patient notified

## 2013-05-25 ENCOUNTER — Telehealth: Payer: Self-pay | Admitting: Cardiovascular Disease

## 2013-05-25 NOTE — Telephone Encounter (Signed)
Patient was on the phone crying complaining of chest pain 3/10.  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  The pain is a chest pain, the same pain that she had prior to her STEMI in March. She also had an episode of chest pain last week during a stressful work situation that resolved after one nitro.  BP while on the phone with me 182/89.  No other sx. I advised her to go ahead and take a third nitro.  I reviewed the situation with Kerin Ransom PAC.  If the pain resolved after the third nitro, make an appt with someone later this week.  If the pain didn't resolve, she was advised to go to the ER.  I stayed on the phone with the patient.  After the third nito, she felt much better.  Her pain resolved.  She was planning to go home and relax.  I made an appt with Dr Ellyn Hack for tomorrow.  I advised that if her pain came back, take a nitro and call 911.  She verbalized understanding.

## 2013-05-26 ENCOUNTER — Encounter: Payer: Self-pay | Admitting: Cardiology

## 2013-05-26 ENCOUNTER — Ambulatory Visit (INDEPENDENT_AMBULATORY_CARE_PROVIDER_SITE_OTHER): Payer: 59 | Admitting: Cardiology

## 2013-05-26 VITALS — BP 172/82 | HR 47 | Ht 67.0 in | Wt 231.3 lb

## 2013-05-26 DIAGNOSIS — I2119 ST elevation (STEMI) myocardial infarction involving other coronary artery of inferior wall: Secondary | ICD-10-CM

## 2013-05-26 DIAGNOSIS — R079 Chest pain, unspecified: Secondary | ICD-10-CM

## 2013-05-26 DIAGNOSIS — Z9861 Coronary angioplasty status: Secondary | ICD-10-CM

## 2013-05-26 DIAGNOSIS — I10 Essential (primary) hypertension: Secondary | ICD-10-CM

## 2013-05-26 DIAGNOSIS — R0602 Shortness of breath: Secondary | ICD-10-CM

## 2013-05-26 DIAGNOSIS — R001 Bradycardia, unspecified: Secondary | ICD-10-CM

## 2013-05-26 DIAGNOSIS — E785 Hyperlipidemia, unspecified: Secondary | ICD-10-CM

## 2013-05-26 DIAGNOSIS — I2510 Atherosclerotic heart disease of native coronary artery without angina pectoris: Secondary | ICD-10-CM

## 2013-05-26 DIAGNOSIS — I498 Other specified cardiac arrhythmias: Secondary | ICD-10-CM

## 2013-05-26 MED ORDER — ISOSORBIDE MONONITRATE ER 30 MG PO TB24: 30.0000 mg | ORAL_TABLET | Freq: Every day | ORAL | Status: DC

## 2013-05-26 NOTE — Progress Notes (Signed)
05/28/2013   PCP: Pcp Not In System Primary Cardiologist:  Dr. Gwenlyn Found  Chief Complaint  Patient presents with  . 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 This was treated with a 2.25 mm 15 mm Xience Alpine DES stent postdilated almost a 2.5 mm. She has been seen 3 times since her discharge including 2 visits with an APP and the third with Dr. Gwenlyn Found last month.  She continued to have some dyspnea type symptoms related Brilinta. 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 The breezeway is uphill and by the time she got to the top of the breezeway she was profoundly dyspneic and had some chest tightness. He did not get better after the second nitroglycerin. The symptoms have stayed for quite a bit of time and went away several minutes after taking the third dose of nitroglycerin. She therefore called in for an appointment to be seen urgently today. She was actually seen shortly after her discharge for evaluation of dyspnea which was possibly suggestive of mild volume overload versus effect of Brilinta.  Other than the episode that she had yesterday, she really has not had any chest discomfort. She still has the gasping dyspnea episodes. She denies any palpitations or rapid heart beats. No dyspnea necessarily with exertion except for yesterday. No PND, orthopnea or edema. No claudication. No melena, hematochezia or hematuria. She has had a mild nosebleed but nothing significant. No syncope or near syncope. No TIA or amaurosis fugax symptoms.  Allergies  Allergen  Reactions  . Aggrenox [Aspirin-Dipyridamole Er] Other (See Comments)    Severe headache    Current Outpatient Prescriptions  Medication Sig Dispense Refill  . aspirin 81 MG tablet Take 81 mg by mouth daily.      [name] atorvastatin (LIPITOR) 80 MG tablet Take 1 tablet (80 mg total) by mouth daily at 6 PM.  30 tablet  5  . Blood Glucose Monitoring Suppl (ONE TOUCH ULTRA SYSTEM KIT) W/DEVICE KIT 1 kit by Does not apply route once.  1 each  0  . hydrochlorothiazide (MICROZIDE) 12.5 MG capsule Take 1 capsule (12.5 mg total) by mouth daily.  30 capsule  6  . lisinopril (PRINIVIL,ZESTRIL) 5 MG tablet Take 1 tablet (5 mg total) by mouth daily.  30 tablet  6  . metFORMIN (GLUCOPHAGE) 500 MG tablet Take 1 tablet (500 mg total) by mouth 2 (two) times daily with a meal.  60 tablet  5  . metoprolol tartrate (LOPRESSOR) 25 MG tablet Take 0.5 tablets (12.5 mg total) by mouth every morning.  60 tablet  5  . nitroGLYCERIN (NITROSTAT) 0.4 MG SL tablet Place 1 tablet (0.4 mg total) under the tongue every 5 (five) minutes x 3 doses as needed for chest pain.  25 tablet  2  . Ticagrelor (BRILINTA) 90 MG TABS tablet Take 1 tablet (90 mg total) by mouth 2 (two) times daily.  60 tablet  10   No current facility-administered medications for this visit.    Past Medical  History  Diagnosis Date  . CVA (cerebral infarction)     Residual R leg weakness  . CAD S/P percutaneous coronary angioplasty 03/19/13    s/p 2.25 mm x 15 mm (2.46) DES to mid AV Groove Circumflex  . ST elevation myocardial infarction (STEMI) of inferolateral wall, subsequent episode of care 03/19/13    s/p DES to mid AV Groove Circumflex; EF 50% on cath  . Diabetes mellitus type 2 in obese   . Hypertension   . Hyperlipidemia LDL goal <70   . Obesity     ROS: Comprehensive review of systems with the exception of symptoms noted above is negative as indicated above and below. General:no colds or fevers,  weight back down closer to discharge  we Skin:no rashes or ulcers HEENT:no blurred vision, no congestion CV:see HPI PUL:see HPI GI:no diarrhea constipation or melena, no indigestion GU:no hematuria, no dysuria MS:no joint pain, no claudication, mild edema Neuro:no syncope, no lightheadedness Endo:New diabetes, no thyroid disease  PHYSICAL EXAM BP 172/82  Pulse 47  Ht $R'5\' 7"'gI$  (1.702 m)  Wt 231 lb 4.8 oz (104.917 kg)  BMI 36.22 kg/m2 General:alert and oriented x 3,Pleasant affect, NAD; moderately obese Skin:Warm and dry, brisk capillary refill HEENT:normocephalic, sclera clear, mucus membranes moist Neck:supple, no JVD, no bruits  Heart:S1S2 RRR without murmur, gallup, rub or click Lungs: with bibasilar rales, no rhonchi, or wheezes YCX:KGYJ, non tender, + BS, do not palpate liver spleen or masses Ext:tr lower ext edema, 2+ pedal pulses, 2+ radial pulses Neuro: MAE, follows commands, + facial symmetry  EKG:  SB rate 47 no acute changes -- very similar to last EKG.  ASSESSMENT AND PLAN Chest pain with moderate risk for cardiac etiology Relatively compelling symptoms of chest discomfort with exertion a bit alleviated somewhat by rest, but it did take a long time for the nitroglycerin to take in. She has not had any additional symptoms since, with either rest or exertion. However being 3 months out from PCI, we will exclude an ischemic etiology.  Plan: Treadmill Myoview; I will also add Imdur for long-acting beta blocker effect.  CAD S/P percutaneous coronary angioplasty Relatively small diameter DES stent in the circumflex. She has diabetes, and therefore would benefit from being on dual antiplatelet therapy for a year. He is noting dyspnea from the Brilinta, would consider converting to Plavix (or Effient unless a contraindication).  I will defer this to Dr. Gwenlyn Found in followup. Otherwise she is on beta blocker, ACE inhibitor and statin.  HTN (hypertension) Poorly controlled today. He is quite anxious. I would monitor  how she does with her stress test as for her blood pressure goes. Could likely increase the ACE inhibitor dose.  Dyslipidemia, goal LDL below 70 On high-dose atorvastatin. Based on her labs being from March, she would be due for another set of labs and about a month.  Shortness of breath I don't think her symptoms are related to heart failure. Most likely other associated with the Brilinta. Hopefully the next month or so they can resolve, otherwise we would consider the possibility of having to switch to Plavix.  Defer to Dr. Gwenlyn Found.    Meds ordered this encounter  Medications  . isosorbide mononitrate (IMDUR) 30 MG 24 hr tablet    Sig: Take 1 tablet (30 mg total) by mouth daily.    Dispense:  30 tablet    Refill:  6   Orders Placed This Encounter  Procedures  . Myocardial Perfusion Imaging  . EKG 12-Lead  Follow-up ~2-4 weeks with Dr. Gwenlyn Found or APP.  Leonie Man, M.D., M.S. Interventional Cardiologist   Pager # [phone] 05/28/2013

## 2013-05-26 NOTE — Patient Instructions (Signed)
You will have test at the [address] office-----Your physician has requested that you have en exercise stress myoview. For further information please visit [email or web address]. Please follow instruction sheet, as given.    Your physician wants you to follow-up in with Dr Gwenlyn Found or Mickel Baas for follow up rest.  You will receive a reminder letter in the mail two months in advance. If you don't receive a letter, please call our office to schedule the follow-up appointment.

## 2013-05-28 ENCOUNTER — Encounter: Payer: Self-pay | Admitting: Cardiology

## 2013-05-28 DIAGNOSIS — R079 Chest pain, unspecified: Secondary | ICD-10-CM | POA: Insufficient documentation

## 2013-05-28 NOTE — Assessment & Plan Note (Signed)
Poorly controlled today. He is quite anxious. I would monitor how she does with her stress test as for her blood pressure goes. Could likely increase the ACE inhibitor dose.

## 2013-05-28 NOTE — Assessment & Plan Note (Addendum)
Relatively small diameter DES stent in the circumflex. She has diabetes, and therefore would benefit from being on dual antiplatelet therapy for a year. He is noting dyspnea from the Brilinta, would consider converting to Plavix (or Effient unless a contraindication).  I will defer this to Dr. Gwenlyn Found in followup. Otherwise she is on beta blocker, ACE inhibitor and statin.

## 2013-05-28 NOTE — Assessment & Plan Note (Signed)
I don't think her symptoms are related to heart failure. Most likely other associated with the Brilinta. Hopefully the next month or so they can resolve, otherwise we would consider the possibility of having to switch to Plavix.  Defer to Dr. Gwenlyn Found.

## 2013-05-28 NOTE — Assessment & Plan Note (Signed)
On high-dose atorvastatin. Based on her labs being from March, she would be due for another set of labs and about a month.

## 2013-05-28 NOTE — Assessment & Plan Note (Signed)
Relatively compelling symptoms of chest discomfort with exertion a bit alleviated somewhat by rest, but it did take a long time for the nitroglycerin to take in. She has not had any additional symptoms since, with either rest or exertion. However being 3 months out from PCI, we will exclude an ischemic etiology.  Plan: Treadmill Myoview; I will also add Imdur for long-acting beta blocker effect.

## 2013-05-31 ENCOUNTER — Telehealth: Payer: Self-pay | Admitting: Cardiology

## 2013-05-31 NOTE — Telephone Encounter (Signed)
Called and gave [name] the appointment for [name] at [facility] for exercise myoview.  Scheduled for Thursday 06/02/13 @ 8:am---arrival time 7:45---NPO after midnight and no caffeine for 12 hrs prior.  No smoking prior to study.   [name] understanding.

## 2013-06-02 ENCOUNTER — Ambulatory Visit: Payer: Self-pay

## 2013-06-02 DIAGNOSIS — R079 Chest pain, unspecified: Secondary | ICD-10-CM

## 2013-06-03 ENCOUNTER — Encounter (HOSPITAL_COMMUNITY): Payer: 59

## 2013-06-13 ENCOUNTER — Ambulatory Visit: Payer: Self-pay | Admitting: Family Medicine

## 2013-06-15 ENCOUNTER — Telehealth: Payer: Self-pay | Admitting: Cardiovascular Disease

## 2013-06-15 NOTE — Telephone Encounter (Signed)
Yvonne Shaw states she faxed over paperwork.

## 2013-06-15 NOTE — Telephone Encounter (Signed)
She said she have faxed her Cardiac Rehab order twice. She faxed them over on March 27th and today. Please fax this back asap.

## 2013-06-15 NOTE — Telephone Encounter (Signed)
Left message for Yvonne Shaw informing her of this.

## 2013-06-18 LAB — HEPATIC FUNCTION PANEL
ALK PHOS: 70 U/L (ref 39–117)
ALT: 19 U/L (ref 0–35)
AST: 19 U/L (ref 0–37)
Albumin: 4.3 g/dL (ref 3.5–5.2)
BILIRUBIN DIRECT: 0.1 mg/dL (ref 0.0–0.3)
BILIRUBIN INDIRECT: 0.3 mg/dL (ref 0.2–1.2)
TOTAL PROTEIN: 7 g/dL (ref 6.0–8.3)
Total Bilirubin: 0.4 mg/dL (ref 0.2–1.2)

## 2013-06-18 LAB — LIPID PANEL
CHOLESTEROL: 140 mg/dL (ref 0–200)
HDL: 49 mg/dL (ref >39)
LDL Cholesterol: 75 mg/dL (ref 0–99)
TRIGLYCERIDES: 78 mg/dL (ref <150)
Total CHOL/HDL Ratio: 2.9 Ratio
VLDL: 16 mg/dL (ref 0–40)

## 2013-06-21 ENCOUNTER — Encounter: Payer: Self-pay | Admitting: *Deleted

## 2013-06-21 ENCOUNTER — Encounter: Payer: Self-pay | Admitting: Cardiovascular Disease

## 2013-07-06 ENCOUNTER — Ambulatory Visit: Payer: Self-pay | Admitting: Family Medicine

## 2013-07-06 ENCOUNTER — Encounter: Payer: Self-pay | Admitting: Cardiovascular Disease

## 2013-07-25 ENCOUNTER — Telehealth: Payer: Self-pay | Admitting: Cardiology

## 2013-07-26 NOTE — Telephone Encounter (Signed)
Closed encounter °

## 2013-08-05 ENCOUNTER — Telehealth: Payer: Self-pay | Admitting: Cardiovascular Disease

## 2013-08-05 ENCOUNTER — Ambulatory Visit: Payer: 59 | Admitting: Cardiology

## 2013-08-06 ENCOUNTER — Encounter: Payer: Self-pay | Admitting: Cardiovascular Disease

## 2013-08-10 ENCOUNTER — Encounter: Payer: Self-pay | Admitting: Cardiology

## 2013-08-10 ENCOUNTER — Other Ambulatory Visit: Payer: Self-pay

## 2013-08-10 ENCOUNTER — Ambulatory Visit (INDEPENDENT_AMBULATORY_CARE_PROVIDER_SITE_OTHER): Payer: 59 | Admitting: Cardiology

## 2013-08-10 VITALS — BP 148/64 | HR 68 | Ht 67.0 in | Wt 228.0 lb

## 2013-08-10 DIAGNOSIS — I10 Essential (primary) hypertension: Secondary | ICD-10-CM

## 2013-08-10 DIAGNOSIS — I2119 ST elevation (STEMI) myocardial infarction involving other coronary artery of inferior wall: Secondary | ICD-10-CM

## 2013-08-10 DIAGNOSIS — I2510 Atherosclerotic heart disease of native coronary artery without angina pectoris: Secondary | ICD-10-CM

## 2013-08-10 DIAGNOSIS — R001 Bradycardia, unspecified: Secondary | ICD-10-CM

## 2013-08-10 DIAGNOSIS — R079 Chest pain, unspecified: Secondary | ICD-10-CM

## 2013-08-10 MED ORDER — ATORVASTATIN CALCIUM 80 MG PO TABS: 80.0000 mg | ORAL_TABLET | Freq: Every day | ORAL | Status: AC

## 2013-08-10 MED ORDER — HYDROCHLOROTHIAZIDE 12.5 MG PO CAPS: 12.5000 mg | ORAL_CAPSULE | Freq: Every day | ORAL | Status: DC

## 2013-08-10 MED ORDER — METOPROLOL TARTRATE 25 MG PO TABS: 12.5000 mg | ORAL_TABLET | Freq: Every morning | ORAL | Status: DC

## 2013-08-10 NOTE — Patient Instructions (Signed)
Your physician wants you to follow-up in: 6 MONTHS WITH DR BERRY You will receive a reminder letter in the mail two months in advance. If you don't receive a letter, please call our office to schedule the follow-up appointment.  

## 2013-08-10 NOTE — Progress Notes (Signed)
Patient ID: Yvonne Shaw, female   DOB: [birth year], 63 y.o.   MRN: [id number]    08/10/2013 Yvonne Shaw   03/31/1950  [id number]  Primary Physicia Pcp Not In System Primary Cardiologist: Dr. Gwenlyn Found   HPI:  Mrs. Yvonne Shaw presents to clinic today for f/u regarding her CAD. She is a 63 y.o. [name] female, followed by Dr. Gwenlyn Found, with a PMHx s/f h/o CVA, HTN, HLD and obesity who was admitted to [facility] on 03/19/13 for STEMI. She was cathed by Dr. Gwenlyn Found and was found to have a 90% ulcerative plaque in the AV groove circunmflex. This was successfully treated with PCI utilizing a DES. She had mild LV dysfunction, with an estimated EF of 50%. She was placed on DAPT with ASA + Brilinta. She was also placed on 25 mg of Lopressor BID and 80 mg of Lipitor. 500 mg of Metformin BID was also intiated >48 hr post cath, as Hgb A1C suggested a new diagnosis of DM with a calculated level of 6.5. She is also on HCTZ and lisinopril for HTN.   She was last seen in clinic by Dr. 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 Dr. Ellyn Hack prescribed Imdur and ordered a NST to r/o ischemia. The nuclear study was performed at [facility] and was interpreted by Dr. Rockey Situ as a low risk study w/o evidence of ischemia. She states that since starting Imdur, she has not had any further exertional or resting anginal symptoms. Occasionally, she feels "winded" and it has been felt that this may be related to [place]. However, she states this has significantly improved overtime. She denies any dyspnea currently. No orthopnea, PND, LEE or weight gain. She is enrolled in cardiac rehab at [facility] and denies any symptoms with activity and no limitations. She reports full medication compliance.     Current Outpatient Prescriptions  Medication Sig Dispense Refill  . aspirin 81 MG tablet  Take 81 mg by mouth daily.      [name] atorvastatin (LIPITOR) 80 MG tablet Take 1 tablet (80 mg total) by mouth daily at 6 PM.  30 tablet  5  . Blood Glucose Monitoring Suppl (ONE TOUCH ULTRA SYSTEM KIT) W/DEVICE KIT 1 kit by Does not apply route once.  1 each  0  . hydrochlorothiazide (MICROZIDE) 12.5 MG capsule Take 1 capsule (12.5 mg total) by mouth daily.  30 capsule  6  . isosorbide mononitrate (IMDUR) 30 MG 24 hr tablet Take 1 tablet (30 mg total) by mouth daily.  30 tablet  6  . lisinopril (PRINIVIL,ZESTRIL) 5 MG tablet Take 1 tablet (5 mg total) by mouth daily.  30 tablet  6  . metFORMIN (GLUCOPHAGE) 500 MG tablet Take 1 tablet (500 mg total) by mouth 2 (two) times daily with a meal.  60 tablet  5  . metoprolol tartrate (LOPRESSOR) 25 MG tablet Take 0.5 tablets (12.5 mg total) by mouth every morning.  60 tablet  5  . nitroGLYCERIN (NITROSTAT) 0.4 MG SL tablet Place 1 tablet (0.4 mg total) under the tongue every 5 (five) minutes x 3 doses as needed for chest pain.  25 tablet  2  . Ticagrelor (BRILINTA) 90 MG TABS tablet Take 1 tablet (90 mg total) by mouth 2 (two) times daily.  60 tablet  10   No current facility-administered medications for  this visit.    Allergies  Allergen Reactions  . Aggrenox [Aspirin-Dipyridamole Er] Other (See Comments), Nausea Only and Nausea And Vomiting    Severe headache  . Carvedilol Other (See Comments)    caused fatigue    History   Social History  . Marital Status: Married    Spouse Name: N/A    Number of Children: N/A  . Years of Education: N/A   Occupational History  . Not on file.   Social History Main Topics  . Smoking status: Never Smoker   . Smokeless tobacco: Not on file  . Alcohol Use: No  . Drug Use: No  . Sexual Activity: Not on file   Other Topics Concern  . Not on file   Social History Narrative   Married, 2 healthy children. Lives in [place], [place]. Works at [employer].     Review of Systems: General: negative for chills, fever,  night sweats or weight changes.  Cardiovascular: negative for chest pain, dyspnea on exertion, edema, orthopnea, palpitations, paroxysmal nocturnal dyspnea or shortness of breath Dermatological: negative for rash Respiratory: negative for cough or wheezing Urologic: negative for hematuria Abdominal: negative for nausea, vomiting, diarrhea, bright red blood per rectum, melena, or hematemesis Neurologic: negative for visual changes, syncope, or dizziness All other systems reviewed and are otherwise negative except as noted above.    Height 5' 7" (1.702 m).  General appearance: alert, cooperative and no distress Neck: no carotid bruit and no JVD Lungs: clear to auscultation bilaterally Heart: regular rate and rhythm, S1, S2 normal, no murmur, click, rub or gallop Extremities: no LEE Pulses: 2+ and symmetric Skin: Skin color, texture, turgor normal. No rashes or lesions Neurologic: Grossly normal  EKG Not preformed  ASSESSMENT AND PLAN:   1. CAD: s/p STEMI 03/19/13 resulting in PCI +DES to AV groove circumflex. No recent anginal symptoms. Recent NST on 06/02/13 was negative for ischemia and low risk. Continue DAPT with ASA + Brilinta, as well as BB, ACE-I, statin and long acting nitrate.   2. HTN: mildly elevated. Her PCP recently increased her lisionopril to 20 mg daily. Continue this, along with HCTZ, metoprolol and Imdur. We discussed increasing physical activity as she prorgresses through cardiac rehab and eating a heart healthy diet, low in sodium.  3. HLD: Recent f/u lipid study from 06/17/13 demonstrated significant improvement in LDL reduction from 122 (at time of STEMI) down to 75. Goal LDL is <70. Continue Lipitor as she is tolerating this well w/o side effects. Recent LFTs revealed normal levels.   4. DM: followed by PCP. On metformin. Hgb A1c is at goal of <7.0 at 6.5.   PLAN  The patient appears to be doing well from a cardiac standpoint. We will continue her on her current  medications for CAD, hypertension and hyperlipidemia as outlined in detail above. She has been instructed to followup with her primary cardiologist, Dr. Gwenlyn Found, in 6 months for reassessment, or sooner if needed.  SIMMONS, BRITTAINYPA-C 08/10/2013 9:30 AM

## 2013-08-11 NOTE — Telephone Encounter (Signed)
Received Completed Form (Attending Physician Statement) from [place] at [facility] 08/11/13  Given to [place] for Dr Gwenlyn Found to sign.

## 2013-08-12 NOTE — Progress Notes (Signed)
LVM 8/7

## 2013-08-15 ENCOUNTER — Telehealth: Payer: Self-pay | Admitting: Cardiovascular Disease

## 2013-08-15 NOTE — Telephone Encounter (Signed)
Dr Gwenlyn Found just signed the forms.  Forms returned to [facility]) for final prep.

## 2013-08-15 NOTE — Telephone Encounter (Signed)
Follow Up    Pt is following up on documentation from [place]. Requesting a phone call when form is signed by Dr. Gwenlyn Found and when she needs to come pick up the form from our office.

## 2013-08-15 NOTE — Telephone Encounter (Signed)
Spoke with Mrs Hejl.  Advised her paperwork from [employer] is completed and signed by Dr Gwenlyn Found.  She is to pick up forms today.  lp

## 2013-08-23 ENCOUNTER — Telehealth: Payer: Self-pay | Admitting: Cardiovascular Disease

## 2013-08-23 NOTE — Telephone Encounter (Signed)
Please call,she had an incident in Rehab yesterday. They told her to call and discuss with you.

## 2013-08-23 NOTE — Telephone Encounter (Signed)
Sched an appointment with a MLP.  JJB

## 2013-08-23 NOTE — Telephone Encounter (Signed)
Please close Release on Ltanya Bayley DOB 05/09/1950

## 2013-08-23 NOTE — Telephone Encounter (Signed)
States while walking on the treadmill yesterday she was taken off due to multifocal bigeminy.  Told to call here to let Dr. Gwenlyn Found know and she faxed the report over.  Patient asymptomatic.  Scheduled for rehab tomorrow PM - told to report and see if she has the PVC's again.  Message sent to Dr. Gwenlyn Found and Curt Bears for review and advise. Patient voiced understanding.

## 2013-08-24 ENCOUNTER — Telehealth: Payer: Self-pay | Admitting: Cardiovascular Disease

## 2013-08-24 NOTE — Telephone Encounter (Signed)
lmom 

## 2013-08-24 NOTE — Telephone Encounter (Signed)
Spoke to [place]. She wanted to let Dr Gwenlyn Found know that patient is having frequent ectopy and if office received strips from 08/22/13 RN informed Arbie Cookey - received session strips awaiting for Dr Gwenlyn Found to review.  Informed Arbie Cookey Dr Gwenlyn Found will be in office tomorrow. Arbie Cookey states she will send today's session stripsand send patient home

## 2013-08-25 NOTE — Telephone Encounter (Signed)
Spoke with patient, she will come in tomorrow for office visit with Dr Gwenlyn Found

## 2013-08-25 NOTE — Telephone Encounter (Signed)
Patient coming in for an appt 8/21

## 2013-08-26 ENCOUNTER — Encounter: Payer: Self-pay | Admitting: Cardiovascular Disease

## 2013-08-26 ENCOUNTER — Ambulatory Visit (INDEPENDENT_AMBULATORY_CARE_PROVIDER_SITE_OTHER): Payer: 59 | Admitting: Cardiovascular Disease

## 2013-08-26 VITALS — BP 150/82 | HR 60 | Ht 67.0 in | Wt 227.0 lb

## 2013-08-26 DIAGNOSIS — I4949 Other premature depolarization: Secondary | ICD-10-CM

## 2013-08-26 DIAGNOSIS — I493 Ventricular premature depolarization: Secondary | ICD-10-CM

## 2013-08-26 DIAGNOSIS — I2119 ST elevation (STEMI) myocardial infarction involving other coronary artery of inferior wall: Secondary | ICD-10-CM

## 2013-08-26 DIAGNOSIS — I498 Other specified cardiac arrhythmias: Secondary | ICD-10-CM

## 2013-08-26 DIAGNOSIS — R001 Bradycardia, unspecified: Secondary | ICD-10-CM

## 2013-08-26 DIAGNOSIS — I10 Essential (primary) hypertension: Secondary | ICD-10-CM

## 2013-08-26 DIAGNOSIS — I2510 Atherosclerotic heart disease of native coronary artery without angina pectoris: Secondary | ICD-10-CM

## 2013-08-26 DIAGNOSIS — E785 Hyperlipidemia, unspecified: Secondary | ICD-10-CM

## 2013-08-26 MED ORDER — METOPROLOL TARTRATE 25 MG PO TABS: 12.5000 mg | ORAL_TABLET | Freq: Two times a day (BID) | ORAL | Status: DC

## 2013-08-26 NOTE — Assessment & Plan Note (Signed)
Patient returns today because of unifocal PVCs seen on telemetry during cardiac rehabilitation. She is unaware of these. She is only on metoprolol 12.5 mg once a day. I'm going to increase this to twice a day, obtain a 2-D echocardiogram and a 30 day event monitor.

## 2013-08-26 NOTE — Assessment & Plan Note (Signed)
Bradycardic on low-dose beta blocker

## 2013-08-26 NOTE — Assessment & Plan Note (Signed)
On statin therapy with recent lipid profile performed 06/17/13 revealed a glucose of 140, LDL of 75 and HDL of 49

## 2013-08-26 NOTE — Progress Notes (Signed)
08/26/2013 Yvonne Shaw   14-Mar-1950  [id number]  Primary Physician Pcp Not In System Primary Cardiologist: Lorretta Harp MD Renae Gloss   HPI:  Yvonne Shaw is a 63 y.o. female Married [place] American female mother of 2, grandmother and 6 grandchildren he works as a Marine scientist at [employer].she had no prior cardiac history, PMHx s/f h/o CVA, HTN, HLD and obesity who was admitted to [facility] on 03/19/13 with an inferior STEMI. She reports being in her USOH until ~ 9PM last night when she began experiencing intermittent substernal chest burning w/o radiation or associated symptoms. She was not initially concerned and attributed this to indigestion. She went to sleep and was awoken at 6AM by the same discomfort which had increased to a 9/10 in intensity and with associated nausea. She got up and tried to busy herself around the house, however the discomfort persisted. She alerted her husband who called 911. Serial EKG tracings in the field reveal fluctuating ST segments (110m elevation to isoelectric) in leads III, aVF. [facility]. She did receive ASA x 4 and heparin bolus. [facility]. 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 She was just been discharged 2 days later. Her only problems have been episodic shortness of breath probably related to the [place] have an episode of chest pain in May [facility]. While performing cardiac rehabilitation care it was noted that she had frequent PVCs on telemetry although she was unaware of this.    Current Outpatient Prescriptions  Medication Sig Dispense Refill  . aspirin 81 MG tablet Take 81 mg by mouth daily.      .Marland Kitchenatorvastatin (LIPITOR) 80 MG tablet Take 1 tablet (80 mg total) by mouth  daily at 6 PM.  30 tablet  5  . Blood Glucose Monitoring Suppl (ONE TOUCH ULTRA SYSTEM KIT) W/DEVICE KIT 1 kit by Does not apply route once.  1 each  0  . hydrochlorothiazide (MICROZIDE) 12.5 MG capsule Take 1 capsule (12.5 mg total) by mouth daily.  30 capsule  6  . isosorbide mononitrate (IMDUR) 30 MG 24 hr tablet Take 1 tablet (30 mg total) by mouth daily.  30 tablet  6  . lisinopril (PRINIVIL,ZESTRIL) 20 MG tablet Take 20 mg by mouth daily.      . metFORMIN (GLUCOPHAGE) 500 MG tablet Take 1 tablet (500 mg total) by mouth 2 (two) times daily with a meal.  60 tablet  5  . metoprolol tartrate (LOPRESSOR) 25 MG tablet Take 0.5 tablets (12.5 mg total) by mouth every morning.  60 tablet  5  . nitroGLYCERIN (NITROSTAT) 0.4 MG SL tablet Place 1 tablet (0.4 mg total) under the tongue every 5 (five) minutes x 3 doses as needed for chest pain.  25 tablet  2  . Ticagrelor (BRILINTA) 90 MG TABS tablet Take 1 tablet (90 mg total) by mouth 2 (two) times daily.  60 tablet  10   No current facility-administered medications for this visit.    Allergies  Allergen Reactions  . Aggrenox [Aspirin-Dipyridamole Er] Other (See Comments), Nausea Only and Nausea And Vomiting    Severe headache  . Carvedilol Other (See Comments)    caused fatigue    History   Social History  . Marital Status: Married  Spouse Name: N/A    Number of Children: N/A  . Years of Education: N/A   Occupational History  . Not on file.   Social History Main Topics  . Smoking status: Never Smoker   . Smokeless tobacco: Not on file  . Alcohol Use: No  . Drug Use: No  . Sexual Activity: Not on file   Other Topics Concern  . Not on file   Social History Narrative   Married, 2 healthy children. Lives in [place], [place]. Works at [employer].     Review of Systems: General: negative for chills, fever, night sweats or weight changes.  Cardiovascular: negative for chest pain, dyspnea on exertion, edema, orthopnea, palpitations,  paroxysmal nocturnal dyspnea or shortness of breath Dermatological: negative for rash Respiratory: negative for cough or wheezing Urologic: negative for hematuria Abdominal: negative for nausea, vomiting, diarrhea, bright red blood per rectum, melena, or hematemesis Neurologic: negative for visual changes, syncope, or dizziness All other systems reviewed and are otherwise negative except as noted above.    Blood pressure 150/82, pulse 60, height _0  (1.702 m), weight 227 lb (102.967 kg).  General appearance: alert and no distress Neck: no adenopathy, no carotid bruit, no JVD, supple, symmetrical, trachea midline and thyroid not enlarged, symmetric, no tenderness/mass/nodules Lungs: clear to auscultation bilaterally Heart: regular rate and rhythm, S1, S2 normal, no murmur, click, rub or gallop Extremities: extremities normal, atraumatic, no cyanosis or edema  EKG not performed today  ASSESSMENT AND PLAN:   ST elevation myocardial infarction (STEMI) of inferior wall Status post circumflex stenting by myself 03/19/13 with a drug-eluting stent. Her EF was 50% with mild global hypokinesia and inferior hypokinesia. Her LAD had no significant disease and her RCA was normal as well. 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 She denies chest pain or shortness of breath.  HTN (hypertension) Controlled on current medications  Dyslipidemia, goal LDL below 70 On statin therapy with recent lipid profile performed 06/17/13 revealed a glucose of 140, LDL of 75 and HDL of 49  Sinus bradycardia Bradycardic on low-dose beta blocker  PVC's (premature ventricular contractions) Patient returns today because of unifocal PVCs seen on telemetry during cardiac rehabilitation. She is unaware of these. She is only on metoprolol 12.5 mg once a day. I'm going to increase this to twice  a day, obtain a 2-D echocardiogram and a 30 day event monitor.      Lorretta Harp MD FACP,FACC,FAHA, [facility] 08/26/2013 12:22 PM

## 2013-08-26 NOTE — Assessment & Plan Note (Signed)
Controlled on current medications 

## 2013-08-26 NOTE — Patient Instructions (Addendum)
  We will see you back in follow up in 3 months  Dr Gwenlyn Found has ordered: 1.  Event monitor. Event monitors are medical devices that record the heart's electrical activity. Doctors most often [place] these monitors to diagnose arrhythmias. Arrhythmias are problems with the speed or rhythm of the heartbeat. The monitor is a small, portable device. You can wear one while you do your normal daily activities. This is usually used to diagnose what is causing palpitations/syncope (passing out).  2.  Echocardiogram. Echocardiography is a painless test that uses sound waves to create images of your heart. It provides your doctor with information about the size and shape of your heart and how well your heart's chambers and valves are working. This procedure takes approximately one hour. There are no restrictions for this procedure.   3. Increase Lopressor to 12.5mg  twice a day

## 2013-08-26 NOTE — Assessment & Plan Note (Signed)
Status post circumflex stenting by myself 03/19/13 with a drug-eluting stent. Her EF was 50% with mild global hypokinesia and inferior hypokinesia. Her LAD had no significant disease and her RCA was normal as well. 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 She denies chest pain or shortness of breath.

## 2013-09-01 ENCOUNTER — Ambulatory Visit (HOSPITAL_COMMUNITY)
Admission: RE | Admit: 2013-09-01 | Discharge: 2013-09-01 | Disposition: A | Discharge status: Home / Self Care | Payer: 59 | Source: Ambulatory Visit | Attending: Cardiovascular Disease | Admitting: Cardiovascular Disease

## 2013-09-01 DIAGNOSIS — I10 Essential (primary) hypertension: Secondary | ICD-10-CM | POA: Insufficient documentation

## 2013-09-01 DIAGNOSIS — E785 Hyperlipidemia, unspecified: Secondary | ICD-10-CM | POA: Diagnosis not present

## 2013-09-01 DIAGNOSIS — I2119 ST elevation (STEMI) myocardial infarction involving other coronary artery of inferior wall: Secondary | ICD-10-CM

## 2013-09-01 DIAGNOSIS — Z8673 Personal history of transient ischemic attack (TIA), and cerebral infarction without residual deficits: Secondary | ICD-10-CM | POA: Diagnosis not present

## 2013-09-01 DIAGNOSIS — I4949 Other premature depolarization: Secondary | ICD-10-CM | POA: Insufficient documentation

## 2013-09-01 DIAGNOSIS — I493 Ventricular premature depolarization: Secondary | ICD-10-CM

## 2013-09-01 DIAGNOSIS — I517 Cardiomegaly: Secondary | ICD-10-CM

## 2013-09-01 DIAGNOSIS — I2510 Atherosclerotic heart disease of native coronary artery without angina pectoris: Secondary | ICD-10-CM | POA: Diagnosis not present

## 2013-09-01 NOTE — Progress Notes (Signed)
2D Echocardiogram Complete.  09/01/2013   Jame Morrell, RDCS

## 2013-09-05 ENCOUNTER — Encounter: Payer: Self-pay | Admitting: *Deleted

## 2013-09-06 ENCOUNTER — Encounter: Payer: Self-pay | Admitting: Cardiovascular Disease

## 2013-09-14 ENCOUNTER — Encounter: Payer: Self-pay | Admitting: Cardiovascular Disease

## 2013-10-10 NOTE — Progress Notes (Signed)
lmom 

## 2013-10-11 ENCOUNTER — Encounter: Payer: Self-pay | Admitting: Cardiovascular Disease

## 2013-10-11 ENCOUNTER — Telehealth: Payer: Self-pay | Admitting: Cardiovascular Disease

## 2013-10-11 ENCOUNTER — Ambulatory Visit (INDEPENDENT_AMBULATORY_CARE_PROVIDER_SITE_OTHER): Payer: 59 | Admitting: Cardiovascular Disease

## 2013-10-11 VITALS — BP 136/82 | HR 56 | Ht 67.5 in | Wt 224.6 lb

## 2013-10-11 DIAGNOSIS — I493 Ventricular premature depolarization: Secondary | ICD-10-CM

## 2013-10-11 NOTE — Telephone Encounter (Signed)
Returning your call from yesterday. °

## 2013-10-11 NOTE — Patient Instructions (Signed)
Your physician wants you to follow-up in: 6 months with Dr Berry. You will receive a reminder letter in the mail two months in advance. If you don't receive a letter, please call our office to schedule the follow-up appointment.  

## 2013-10-11 NOTE — Progress Notes (Signed)
10/11/2013 Yvonne Shaw   Nov 24, 1950  [id number]  Primary Physician WHITE, Orlene Och, NP Primary Cardiologist: Lorretta Harp MD Renae Gloss   HPI:  Yvonne Shaw is a 63 y.o. female Married [place] American female mother of 2, grandmother and 6 grandchildren he works as a Marine scientist at [employer].she had no prior cardiac history, PMHx s/f h/o CVA, HTN, HLD and obesity who was admitted to [facility] on 03/19/13 with an inferior STEMI. She reports being in her USOH until ~ 9PM last night when she began experiencing intermittent substernal chest burning w/o radiation or associated symptoms. She was not initially concerned and attributed this to indigestion. She went to sleep and was awoken at 6AM by the same discomfort which had increased to a 9/10 in intensity and with associated nausea. She got up and tried to busy herself around the house, however the discomfort persisted. She alerted her husband who called 911. Serial EKG tracings in the field reveal fluctuating ST segments (60mm elevation to isoelectric) in leads III, aVF. She was transported initially to [facility] ED. Code STEMI was called. She did receive ASA x 4 and heparin bolus. She was diverted to [facility] ED for emergent cardiac catheterization. 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 She was just been discharged 2 days later. Her only problems have been episodic shortness of breath probably related to the [place].she did have an episode of chest pain in May underwent Myoview stress testing at [facility] which was nonischemic. While performing cardiac rehabilitation care it was noted that she had frequent PVCs on telemetry although she was unaware of this.patient wore a 30 day event monitor which did show some short runs of nonsustained ventricular tachycardia.    Current Outpatient Prescriptions  Medication Sig Dispense Refill  . aspirin 81 MG  tablet Take 81 mg by mouth daily.      [name] atorvastatin (LIPITOR) 80 MG tablet Take 1 tablet (80 mg total) by mouth daily at 6 PM.  30 tablet  5  . Blood Glucose Monitoring Suppl (ONE TOUCH ULTRA SYSTEM KIT) W/DEVICE KIT 1 kit by Does not apply route once.  1 each  0  . hydrochlorothiazide (MICROZIDE) 12.5 MG capsule Take 1 capsule (12.5 mg total) by mouth daily.  30 capsule  6  . isosorbide mononitrate (IMDUR) 30 MG 24 hr tablet Take 1 tablet (30 mg total) by mouth daily.  30 tablet  6  . lisinopril (PRINIVIL,ZESTRIL) 20 MG tablet Take 20 mg by mouth daily.      . metFORMIN (GLUCOPHAGE) 500 MG tablet Take 1 tablet (500 mg total) by mouth 2 (two) times daily with a meal.  60 tablet  5  . metoprolol tartrate (LOPRESSOR) 25 MG tablet Take 0.5 tablets (12.5 mg total) by mouth 2 (two) times daily.  60 tablet  5  . nitroGLYCERIN (NITROSTAT) 0.4 MG SL tablet Place 1 tablet (0.4 mg total) under the tongue every 5 (five) minutes x 3 doses as needed for chest pain.  25 tablet  2  . Ticagrelor (BRILINTA) 90 MG TABS tablet Take 1 tablet (90 mg total) by mouth 2 (two) times daily.  60 tablet  10   No current facility-administered medications for this visit.    Allergies  Allergen Reactions  . Aggrenox [Aspirin-Dipyridamole Er] Other (See Comments), Nausea Only and Nausea And Vomiting    Severe headache  . Carvedilol Other (See Comments)  caused fatigue    History   Social History  . Marital Status: Married    Spouse Name: N/A    Number of Children: N/A  . Years of Education: N/A   Occupational History  . Not on file.   Social History Main Topics  . Smoking status: Never Smoker   . Smokeless tobacco: Not on file  . Alcohol Use: No  . Drug Use: No  . Sexual Activity: Not on file   Other Topics Concern  . Not on file   Social History Narrative   Married, 2 healthy children. Lives in [place], [place]. Works at [employer].     Review of Systems: General: negative for chills, fever, night sweats or  weight changes.  Cardiovascular: negative for chest pain, dyspnea on exertion, edema, orthopnea, palpitations, paroxysmal nocturnal dyspnea or shortness of breath Dermatological: negative for rash Respiratory: negative for cough or wheezing Urologic: negative for hematuria Abdominal: negative for nausea, vomiting, diarrhea, bright red blood per rectum, melena, or hematemesis Neurologic: negative for visual changes, syncope, or dizziness All other systems reviewed and are otherwise negative except as noted above.    Blood pressure 136/82, pulse 56, height 5' 7.5" (1.715 m), weight 224 lb 9.6 oz (101.878 kg).  General appearance: alert and no distress Neck: no adenopathy, no carotid bruit, no JVD, supple, symmetrical, trachea midline and thyroid not enlarged, symmetric, no tenderness/mass/nodules Lungs: clear to auscultation bilaterally Heart: regular rate and rhythm, S1, S2 normal, no murmur, click, rub or gallop Extremities: extremities normal, atraumatic, no cyanosis or edema  EKG not performed today  ASSESSMENT AND PLAN:   PVC's (premature ventricular contractions) A 30 day event monitor showed several runs of nonsustained ventricular tachycardia. The patient was asymptomatic from these. She is a low-dose beta blocker with heart rates around 54s. She has normal LV function by 2-D echocardiogram. She denies chest pain. We'll continue to monitor these. Because she's been bradycardic on higher doses of blocker I'm not going to dose adjust at this time      Lorretta Harp MD [facility], [facility] 10/11/2013 2:51 PM

## 2013-10-11 NOTE — Telephone Encounter (Signed)
I spoke with patient and scheduled an appt for her to see JB

## 2013-10-11 NOTE — Assessment & Plan Note (Signed)
A 30 day event monitor showed several runs of nonsustained ventricular tachycardia. The patient was asymptomatic from these. She is a low-dose beta blocker with heart rates around 66s. She has normal LV function by 2-D echocardiogram. She denies chest pain. We'll continue to monitor these. Because she's been bradycardic on higher doses of blocker I'm not going to dose adjust at this time

## 2013-11-23 ENCOUNTER — Other Ambulatory Visit: Payer: Self-pay | Admitting: Cardiology

## 2013-11-23 NOTE — Telephone Encounter (Signed)
Rx refill sent to patient pharmacy   

## 2013-11-25 ENCOUNTER — Ambulatory Visit: Payer: 59 | Admitting: Cardiovascular Disease

## 2013-12-15 ENCOUNTER — Encounter (HOSPITAL_COMMUNITY): Payer: Self-pay | Admitting: Cardiovascular Disease

## 2013-12-22 ENCOUNTER — Ambulatory Visit: Payer: Self-pay | Admitting: Family Medicine

## 2014-01-16 ENCOUNTER — Other Ambulatory Visit: Payer: Self-pay | Admitting: Cardiology

## 2014-01-16 NOTE — Telephone Encounter (Signed)
Rx(s) sent to pharmacy electronically.  

## 2014-04-03 ENCOUNTER — Telehealth: Payer: Self-pay | Admitting: Cardiovascular Disease

## 2014-04-10 NOTE — Telephone Encounter (Signed)
Close encounter 

## 2014-04-12 ENCOUNTER — Encounter: Payer: Self-pay | Admitting: *Deleted

## 2014-05-24 ENCOUNTER — Encounter: Payer: Self-pay | Admitting: Cardiovascular Disease

## 2014-05-24 ENCOUNTER — Ambulatory Visit (INDEPENDENT_AMBULATORY_CARE_PROVIDER_SITE_OTHER): Payer: 59 | Admitting: Cardiovascular Disease

## 2014-05-24 VITALS — BP 142/74 | HR 60 | Ht 67.5 in | Wt 235.6 lb

## 2014-05-24 DIAGNOSIS — Z79899 Other long term (current) drug therapy: Secondary | ICD-10-CM | POA: Diagnosis not present

## 2014-05-24 DIAGNOSIS — I2510 Atherosclerotic heart disease of native coronary artery without angina pectoris: Secondary | ICD-10-CM

## 2014-05-24 DIAGNOSIS — I10 Essential (primary) hypertension: Secondary | ICD-10-CM | POA: Diagnosis not present

## 2014-05-24 DIAGNOSIS — E785 Hyperlipidemia, unspecified: Secondary | ICD-10-CM

## 2014-05-24 DIAGNOSIS — Z9861 Coronary angioplasty status: Secondary | ICD-10-CM

## 2014-05-24 MED ORDER — CLOPIDOGREL BISULFATE 75 MG PO TABS: 75.0000 mg | ORAL_TABLET | Freq: Every day | ORAL | Status: DC

## 2014-05-24 NOTE — Progress Notes (Signed)
05/24/2014 [name]   12/05/1950  [id number]  Primary Physician WHITE, Orlene Och, NP Primary Cardiologist: Lorretta Harp MD [place], [place]   HPI:  Yvonne Shaw is a 63y.o. female Married [place] American female mother of 2, grandmother and 6 grandchildren he works as a Marine scientist at [employer].she had no prior cardiac history, PMHx s/f h/o CVA, HTN, HLD and obesity who was admitted to [facility] on 03/19/13 with an inferior STEMI. She reports being in her USOH until ~ 9PM last night when she began experiencing intermittent substernal chest burning w/o radiation or associated symptoms. She was not initially concerned and attributed this to indigestion. She went to sleep and was awoken at 6AM by the same discomfort which had increased to a 9/10 in intensity and with associated nausea. She got up and tried to busy herself around the house, however the discomfort persisted. She alerted her husband who called 911. Serial EKG tracings in the field reveal fluctuating ST segments (26mm elevation to isoelectric) in leads III, aVF. She was transported initially to [facility] ED. Code STEMI was called. She did receive ASA x 4 and heparin bolus. She was diverted to [facility] ED for emergent cardiac catheterization. 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 She was just been discharged 2 days later. Her only problems have been episodic shortness of breath probably related to the [place].she did have an episode of chest pain in May underwent Myoview stress testing at [facility] which was nonischemic. While performing cardiac rehabilitation care it was noted that she had frequent PVCs on telemetry although she was unaware of this.patient wore a 30 day event monitor which did show some short runs of nonsustained ventricular tachycardia. Since I saw her 8 months ago she's remained currently stable but continues to  complain of some dyspnea on exertion which I suspect is related to the [place].   Current Outpatient Prescriptions  Medication Sig Dispense Refill  . amLODipine (NORVASC) 10 MG tablet Take 1 tablet by mouth daily.    [name] aspirin 81 MG tablet Take 81 mg by mouth daily.    [name] atorvastatin (LIPITOR) 80 MG tablet Take 1 tablet (80 mg total) by mouth daily at 6 PM. 30 tablet 5  . Blood Glucose Monitoring Suppl (ONE TOUCH ULTRA SYSTEM KIT) W/DEVICE KIT 1 kit by Does not apply route once. 1 each 0  . hydrochlorothiazide (MICROZIDE) 12.5 MG capsule Take 1 capsule (12.5 mg total) by mouth daily. 30 capsule 6  . hydrochlorothiazide (MICROZIDE) 12.5 MG capsule TAKE 1 CAPSULE (12.5 MG TOTAL) BY MOUTH DAILY. 30 capsule 6  . lisinopril (PRINIVIL,ZESTRIL) 20 MG tablet Take 20 mg by mouth daily.    . metFORMIN (GLUCOPHAGE) 500 MG tablet Take 1 tablet (500 mg total) by mouth 2 (two) times daily with a meal. 60 tablet 5  . metoprolol tartrate (LOPRESSOR) 25 MG tablet Take 0.5 tablets (12.5 mg total) by mouth 2 (two) times daily. 60 tablet 5  . nitroGLYCERIN (NITROSTAT) 0.4 MG SL tablet Place 1 tablet (0.4 mg total) under the tongue every 5 (five) minutes x 3 doses as needed for chest pain. 25 tablet 2  . clopidogrel (PLAVIX) 75 MG tablet Take 1 tablet (75 mg total) by mouth daily. 90 tablet 3   No current facility-administered medications for this visit.    Allergies  Allergen Reactions  . Aggrenox [Aspirin-Dipyridamole Er] Other (See Comments), Nausea Only and  Nausea And Vomiting    Severe headache  . Carvedilol Other (See Comments)    caused fatigue    History   Social History  . Marital Status: Married    Spouse Name: N/A  . Number of Children: N/A  . Years of Education: N/A   Occupational History  . Not on file.   Social History Main Topics  . Smoking status: Never Smoker   . Smokeless tobacco: Not on file  . Alcohol Use: No  . Drug Use: No  . Sexual Activity: Not on file   Other Topics  Concern  . Not on file   Social History Narrative   Married, 2 healthy children. Lives in [place], [place]. Works at [employer].     Review of Systems: General: negative for chills, fever, night sweats or weight changes.  Cardiovascular: negative for chest pain, dyspnea on exertion, edema, orthopnea, palpitations, paroxysmal nocturnal dyspnea or shortness of breath Dermatological: negative for rash Respiratory: negative for cough or wheezing Urologic: negative for hematuria Abdominal: negative for nausea, vomiting, diarrhea, bright red blood per rectum, melena, or hematemesis Neurologic: negative for visual changes, syncope, or dizziness All other systems reviewed and are otherwise negative except as noted above.    Blood pressure 142/74, pulse 60, height 5' 7.5" (1.715 m), weight 235 lb 9.6 oz (106.867 kg).  General appearance: alert and no distress Neck: no adenopathy, no carotid bruit, no JVD, supple, symmetrical, trachea midline and thyroid not enlarged, symmetric, no tenderness/mass/nodules Lungs: clear to auscultation bilaterally Heart: regular rate and rhythm, S1, S2 normal, no murmur, click, rub or gallop Extremities: extremities normal, atraumatic, no cyanosis or edema  EKG normal sinus rhythm at 60 without ST or T-wave changes. I personally reviewed this EKG  ASSESSMENT AND PLAN:   STEMI - s/p DES to mid AV groove Circ 03/19/13; EF 50% on cath History of CAD status post STEMI 03/19/13 involving the mid AV groove circumflex coronary artery. She had a drug eluting stent placed and was on Brilenta. She denies chest pain but has had shortness of breath since her event which I suspect is related to the [place]. Since it's been a year since her event and went to transit position her to Plavix and check a verify now test to determine efficacy of therapy.   Shortness of breath Most likely related to the Brilenta antiplatelet therapy. I am going to transition her to Plavix to see whether or  not this is the etiology.   PVC's (premature ventricular contractions) History of a symptomatically PVCs noted on event monitoring   HTN (hypertension) History of hypertension with blood pressure medicines at 142/74. She is on amlodipine, hydrochlorothiazide, lisinopril and metoprolol. Continue current meds at current dosing   Dyslipidemia, goal LDL below 70 History of dyslipidemia on atorvastatin 80 mg a day followed by her PCP       Lorretta Harp MD [facility], [facility] 05/24/2014 11:34 AM

## 2014-05-24 NOTE — Assessment & Plan Note (Signed)
History of CAD status post STEMI 03/19/13 involving the mid AV groove circumflex coronary artery. She had a drug eluting stent placed and was on Brilenta. She denies chest pain but has had shortness of breath since her event which I suspect is related to the [place]. Since it's been a year since her event and went to transit position her to Plavix and check a verify now test to determine efficacy of therapy.

## 2014-05-24 NOTE — Assessment & Plan Note (Signed)
Most likely related to the Brilenta antiplatelet therapy. I am going to transition her to Plavix to see whether or not this is the etiology.

## 2014-05-24 NOTE — Assessment & Plan Note (Signed)
History of hypertension with blood pressure medicines at 142/74. She is on amlodipine, hydrochlorothiazide, lisinopril and metoprolol. Continue current meds at current dosing

## 2014-05-24 NOTE — Assessment & Plan Note (Signed)
History of a symptomatically PVCs noted on event monitoring

## 2014-05-24 NOTE — Patient Instructions (Addendum)
  We will see you back in follow up in 1 year with Dr Gwenlyn Found.  Dr Gwenlyn Found has ordered: 1. Stop Brilinta  2. Start Plavix 75mg  daily  3. Have bloodwork done 2 weeks after starting Plavix.  The blood work has to be done at [facility] in the main entrance off of [address] and check in at the receptionist desk.

## 2014-05-24 NOTE — Assessment & Plan Note (Signed)
History of dyslipidemia on atorvastatin 80 mg a day followed by her PCP

## 2014-08-29 ENCOUNTER — Encounter: Payer: Self-pay | Admitting: *Deleted

## 2014-08-29 DIAGNOSIS — R0602 Shortness of breath: Secondary | ICD-10-CM | POA: Diagnosis not present

## 2014-08-29 DIAGNOSIS — I25119 Atherosclerotic heart disease of native coronary artery with unspecified angina pectoris: Secondary | ICD-10-CM | POA: Diagnosis not present

## 2014-08-29 DIAGNOSIS — I10 Essential (primary) hypertension: Secondary | ICD-10-CM | POA: Diagnosis not present

## 2014-08-29 DIAGNOSIS — I69351 Hemiplegia and hemiparesis following cerebral infarction affecting right dominant side: Secondary | ICD-10-CM | POA: Diagnosis not present

## 2014-08-29 DIAGNOSIS — I252 Old myocardial infarction: Secondary | ICD-10-CM | POA: Diagnosis not present

## 2014-08-29 DIAGNOSIS — Z888 Allergy status to other drugs, medicaments and biological substances status: Secondary | ICD-10-CM | POA: Diagnosis not present

## 2014-08-29 DIAGNOSIS — Z7982 Long term (current) use of aspirin: Secondary | ICD-10-CM | POA: Diagnosis not present

## 2014-08-29 DIAGNOSIS — K219 Gastro-esophageal reflux disease without esophagitis: Secondary | ICD-10-CM | POA: Diagnosis not present

## 2014-08-29 DIAGNOSIS — E119 Type 2 diabetes mellitus without complications: Secondary | ICD-10-CM | POA: Diagnosis not present

## 2014-08-29 DIAGNOSIS — H2511 Age-related nuclear cataract, right eye: Secondary | ICD-10-CM | POA: Diagnosis not present

## 2014-09-05 ENCOUNTER — Ambulatory Visit: Payer: 59 | Admitting: Anesthesiology

## 2014-09-05 ENCOUNTER — Encounter: Payer: Self-pay | Admitting: *Deleted

## 2014-09-05 ENCOUNTER — Ambulatory Visit
Admission: RE | Admit: 2014-09-05 | Discharge: 2014-09-05 | Disposition: A | Discharge status: Home / Self Care | Payer: 59 | Source: Ambulatory Visit | Attending: Ophthalmology | Admitting: Ophthalmology

## 2014-09-05 ENCOUNTER — Encounter
Admission: RE | Disposition: A | Discharge status: Home / Self Care | Payer: Self-pay | Source: Ambulatory Visit | Attending: Ophthalmology

## 2014-09-05 DIAGNOSIS — E119 Type 2 diabetes mellitus without complications: Secondary | ICD-10-CM | POA: Insufficient documentation

## 2014-09-05 DIAGNOSIS — I69351 Hemiplegia and hemiparesis following cerebral infarction affecting right dominant side: Secondary | ICD-10-CM | POA: Insufficient documentation

## 2014-09-05 DIAGNOSIS — Z888 Allergy status to other drugs, medicaments and biological substances status: Secondary | ICD-10-CM | POA: Insufficient documentation

## 2014-09-05 DIAGNOSIS — H2511 Age-related nuclear cataract, right eye: Secondary | ICD-10-CM | POA: Insufficient documentation

## 2014-09-05 DIAGNOSIS — I252 Old myocardial infarction: Secondary | ICD-10-CM | POA: Insufficient documentation

## 2014-09-05 DIAGNOSIS — Z7982 Long term (current) use of aspirin: Secondary | ICD-10-CM | POA: Insufficient documentation

## 2014-09-05 DIAGNOSIS — I10 Essential (primary) hypertension: Secondary | ICD-10-CM | POA: Insufficient documentation

## 2014-09-05 DIAGNOSIS — K219 Gastro-esophageal reflux disease without esophagitis: Secondary | ICD-10-CM | POA: Insufficient documentation

## 2014-09-05 DIAGNOSIS — I25119 Atherosclerotic heart disease of native coronary artery with unspecified angina pectoris: Secondary | ICD-10-CM | POA: Insufficient documentation

## 2014-09-05 DIAGNOSIS — R0602 Shortness of breath: Secondary | ICD-10-CM | POA: Insufficient documentation

## 2014-09-05 HISTORY — DX: Orthopnea: R06.01

## 2014-09-05 HISTORY — DX: Gastro-esophageal reflux disease without esophagitis: K21.9

## 2014-09-05 HISTORY — PX: CATARACT EXTRACTION W/PHACO: SHX586

## 2014-09-05 HISTORY — DX: Cerebral infarction, unspecified: I63.9

## 2014-09-05 HISTORY — DX: Angina pectoris, unspecified: I20.9

## 2014-09-05 HISTORY — DX: Reserved for inherently not codable concepts without codable children: IMO0001

## 2014-09-05 LAB — POTASSIUM: Potassium: 3.8 mmol/L (ref 3.5–5.1)

## 2014-09-05 LAB — GLUCOSE, CAPILLARY: GLUCOSE-CAPILLARY: 139 mg/dL — AB (ref 65–99)

## 2014-09-05 SURGERY — PHACOEMULSIFICATION, CATARACT, WITH IOL INSERTION
Anesthesia: Monitor Anesthesia Care | Site: Eye | Laterality: Right | Wound class: Clean

## 2014-09-05 MED ORDER — SODIUM CHLORIDE 0.9 % IV SOLN
INTRAVENOUS | Status: DC
  Administered 2014-09-05: 50 mL/h via INTRAVENOUS

## 2014-09-05 MED ORDER — FENTANYL CITRATE (PF) 100 MCG/2ML IJ SOLN
INTRAMUSCULAR | Status: DC | PRN
Start: 2014-09-05 — End: 2014-09-05
  Administered 2014-09-05: 50 ug via INTRAVENOUS

## 2014-09-05 MED ORDER — NA CHONDROIT SULF-NA HYALURON 40-17 MG/ML IO SOLN
INTRAOCULAR | Status: AC
  Filled 2014-09-05: qty 1

## 2014-09-05 MED ORDER — CEFUROXIME OPHTHALMIC INJECTION 1 MG/0.1 ML
INJECTION | OPHTHALMIC | Status: AC
  Filled 2014-09-05: qty 0.1

## 2014-09-05 MED ORDER — EPINEPHRINE HCL 1 MG/ML IJ SOLN
INTRAMUSCULAR | Status: AC
  Filled 2014-09-05: qty 1

## 2014-09-05 MED ORDER — ARMC OPHTHALMIC DILATING GEL
OPHTHALMIC | Status: AC
  Administered 2014-09-05: 1 via OPHTHALMIC
  Filled 2014-09-05: qty 0.25

## 2014-09-05 MED ORDER — GLYCOPYRROLATE 0.2 MG/ML IJ SOLN
INTRAMUSCULAR | Status: DC | PRN
  Administered 2014-09-05: 0.2 mg via INTRAVENOUS

## 2014-09-05 MED ORDER — ARMC OPHTHALMIC DILATING GEL
1.0000 "application " | OPHTHALMIC | Status: AC | PRN
  Administered 2014-09-05 (×2): 1 via OPHTHALMIC

## 2014-09-05 MED ORDER — MOXIFLOXACIN HCL 0.5 % OP SOLN
OPHTHALMIC | Status: AC
  Filled 2014-09-05: qty 3

## 2014-09-05 MED ORDER — TETRACAINE HCL 0.5 % OP SOLN
1.0000 [drp] | OPHTHALMIC | Status: AC | PRN
  Administered 2014-09-05: 1 [drp] via OPHTHALMIC

## 2014-09-05 MED ORDER — EPINEPHRINE HCL 1 MG/ML IJ SOLN
INTRAOCULAR | Status: DC | PRN
  Administered 2014-09-05: 200 mL via OPHTHALMIC

## 2014-09-05 MED ORDER — POVIDONE-IODINE 5 % OP SOLN
OPHTHALMIC | Status: AC
  Administered 2014-09-05: 1 via OPHTHALMIC
  Filled 2014-09-05: qty 30

## 2014-09-05 MED ORDER — TETRACAINE HCL 0.5 % OP SOLN
OPHTHALMIC | Status: AC
  Administered 2014-09-05: 1 [drp] via OPHTHALMIC
  Filled 2014-09-05: qty 2

## 2014-09-05 MED ORDER — MIDAZOLAM HCL 2 MG/2ML IJ SOLN
INTRAMUSCULAR | Status: DC | PRN
  Administered 2014-09-05: 2 mg via INTRAVENOUS

## 2014-09-05 MED ORDER — POVIDONE-IODINE 5 % OP SOLN
1.0000 "application " | OPHTHALMIC | Status: AC | PRN
  Administered 2014-09-05: 1 via OPHTHALMIC

## 2014-09-05 MED ORDER — CEFUROXIME OPHTHALMIC INJECTION 1 MG/0.1 ML
INJECTION | OPHTHALMIC | Status: DC | PRN
  Administered 2014-09-05: 0.1 mL via INTRACAMERAL

## 2014-09-05 MED ORDER — NA CHONDROIT SULF-NA HYALURON 40-17 MG/ML IO SOLN
INTRAOCULAR | Status: DC | PRN
  Administered 2014-09-05: 1 mL via INTRAOCULAR

## 2014-09-05 MED ORDER — MOXIFLOXACIN HCL 0.5 % OP SOLN
OPHTHALMIC | Status: DC | PRN
  Administered 2014-09-05: 2 [drp] via OPHTHALMIC

## 2014-09-05 MED ORDER — MOXIFLOXACIN HCL 0.5 % OP SOLN: 1.0000 [drp] | OPHTHALMIC | Status: DC | PRN

## 2014-09-05 SURGICAL SUPPLY — 24 items
CANNULA ANT/CHMB 27G (MISCELLANEOUS) ×1 IMPLANT
CANNULA ANT/CHMB 27GA (MISCELLANEOUS) ×2 IMPLANT
CUP MEDICINE 2OZ PLAST GRAD ST (MISCELLANEOUS) ×2 IMPLANT
GLOVE BIO SURGEON STRL SZ8 (GLOVE) ×2 IMPLANT
GLOVE BIOGEL M 6.5 STRL (GLOVE) ×2 IMPLANT
GLOVE SURG LX 8.0 MICRO (GLOVE) ×1
GLOVE SURG LX STRL 8.0 MICRO (GLOVE) ×1 IMPLANT
GOWN STRL REUS W/ TWL LRG LVL3 (GOWN DISPOSABLE) ×2 IMPLANT
GOWN STRL REUS W/TWL LRG LVL3 (GOWN DISPOSABLE) ×4
KIT SLEEVE INFUSION .9 MICRO (MISCELLANEOUS) ×1 IMPLANT
LENS IOL TECNIS 23.0 (Intraocular Lens) ×2 IMPLANT
LENS IOL TECNIS MONO 1P 23.0 (Intraocular Lens) IMPLANT
PACK CATARACT (MISCELLANEOUS) ×2 IMPLANT
PACK CATARACT BRASINGTON LX (MISCELLANEOUS) ×2 IMPLANT
PACK EYE AFTER SURG (MISCELLANEOUS) ×2 IMPLANT
SOL BSS BAG (MISCELLANEOUS) ×2
SOL PREP PVP 2OZ (MISCELLANEOUS) ×2
SOLUTION BSS BAG (MISCELLANEOUS) ×1 IMPLANT
SOLUTION PREP PVP 2OZ (MISCELLANEOUS) ×1 IMPLANT
SYR 3ML LL SCALE MARK (SYRINGE) ×2 IMPLANT
SYR 5ML LL (SYRINGE) ×2 IMPLANT
SYR TB 1ML 27GX1/2 LL (SYRINGE) ×2 IMPLANT
WATER STERILE IRR 1000ML POUR (IV SOLUTION) ×2 IMPLANT
WIPE NON LINTING 3.25X3.25 (MISCELLANEOUS) ×2 IMPLANT

## 2014-09-05 NOTE — H&P (Signed)
  All labs reviewed. Abnormal studies sent to patients PCP when indicated.  Previous H&P reviewed, patient examined, there are NO CHANGES.  Yvonne Valenza LOUIS8/30/20167:15 AM

## 2014-09-05 NOTE — Anesthesia Postprocedure Evaluation (Signed)
  Anesthesia Post-op Note  Patient: Yvonne Shaw  Procedure(s) Performed: Procedure(s) with comments: CATARACT EXTRACTION PHACO AND INTRAOCULAR LENS PLACEMENT (IOC) (Right) - [place]: 01:12.2 AP%: 20.1 CDE: 14.45 Lot # [id number]  Anesthesia type:MAC  Patient location: short stay  Post pain: Pain level controlled  Post assessment: Post-op Vital signs reviewed, Patient's Cardiovascular Status Stable, Respiratory Function Stable, Patent Airway and No signs of Nausea or vomiting  Post vital signs: Reviewed and stable  Last Vitals:  Filed Vitals:   09/05/14 0616  BP: 143/65  Pulse: 61  Temp: 36.6 C  Resp: 16    Level of consciousness: awake, alert  and patient cooperative  Complications: No apparent anesthesia complications

## 2014-09-05 NOTE — Anesthesia Preprocedure Evaluation (Addendum)
Anesthesia Evaluation  Patient identified by MRN, date of birth, ID band Patient awake    Reviewed: Allergy & Precautions, NPO status , Patient's Chart, lab work & pertinent test results  Airway Mallampati: III  TM Distance: >3 FB Neck ROM: Full    Dental no notable dental hx.    Pulmonary shortness of breath and with exertion,    Pulmonary exam normal       Cardiovascular hypertension, + angina with exertion + CAD, + Past MI and + Orthopnea Normal cardiovascular exam    Neuro/Psych r leg weakness CVA, Residual Symptoms negative psych ROS   GI/Hepatic GERD-  Medicated and Controlled,  Endo/Other  diabetes, Well Controlled, Type 2  Renal/GU      Musculoskeletal negative musculoskeletal ROS (+)   Abdominal Normal abdominal exam  (+)   Peds  Hematology negative hematology ROS (+)   Anesthesia Other Findings   Reproductive/Obstetrics                            Anesthesia Physical Anesthesia Plan  ASA: III  Anesthesia Plan: MAC   Post-op Pain Management:    Induction: Intravenous  Airway Management Planned: Nasal Cannula  Additional Equipment:   Intra-op Plan:   Post-operative Plan:   Informed Consent: I have reviewed the patients History and Physical, chart, labs and discussed the procedure including the risks, benefits and alternatives for the proposed anesthesia with the patient or authorized representative who has indicated his/her understanding and acceptance.   Dental advisory given  Plan Discussed with: CRNA and Surgeon  Anesthesia Plan Comments:        Anesthesia Quick Evaluation

## 2014-09-05 NOTE — Discharge Instructions (Signed)
AMBULATORY SURGERY  DISCHARGE INSTRUCTIONS   1) The drugs that you were given will stay in your system until tomorrow so for the next 24 hours you should not:  A) Drive an automobile B) Make any legal decisions C) Drink any alcoholic beverage   2) You may resume regular meals tomorrow.  Today it is better to start with liquids and gradually work up to solid foods.  You may eat anything you prefer, but it is better to start with liquids, then soup and crackers, and gradually work up to solid foods.   3) Please notify your doctor immediately if you have any unusual bleeding, trouble breathing, redness and pain at the surgery site, drainage, fever, or pain not relieved by medication.    4) Additional Instructions:    Eye Surgery Discharge Instructions  Expect mild scratchy sensation or mild soreness. DO NOT RUB YOUR EYE!  The day of surgery:  Minimal physical activity, but bed rest is not required  No reading, computer work, or close hand work  No bending, lifting, or straining.  May watch TV  For 24 hours:  No driving, legal decisions, or alcoholic beverages  Safety precautions  Eat anything you prefer: It is better to start with liquids, then soup then solid foods.  _____ Eye patch should be worn until postoperative exam tomorrow.  ____ Solar shield eyeglasses should be worn for comfort in the sunlight/patch while sleeping  Resume all regular medications including aspirin or Coumadin if these were discontinued prior to surgery. You may shower, bathe, shave, or wash your hair. Tylenol may be taken for mild discomfort.  Call your doctor if you experience significant pain, nausea, or vomiting, fever > 101 or other signs of infection. [phone] or [phone] Specific instructions:  Follow-up Information    Follow up with PORFILIO,WILLIAM LOUIS, MD In 1 day.   Specialty:  Ophthalmology   Why:  September 1 at 9:45am   Contact information:   [place] [id number] [phone]         Please contact your physician with any problems or Same Day Surgery at [phone], Monday through Friday 6 am to 4 pm, or  at [facility] number at [phone].

## 2014-09-05 NOTE — Op Note (Signed)
PREOPERATIVE DIAGNOSIS:  Nuclear sclerotic cataract of the right eye.   POSTOPERATIVE DIAGNOSIS: right nuclear sclerotic cataract   OPERATIVE PROCEDURE:  Procedure(s): CATARACT EXTRACTION PHACO AND INTRAOCULAR LENS PLACEMENT (IOC)   SURGEON:  Birder Robson, MD.   ANESTHESIA:  Anesthesiologist: Alvin Critchley, MD CRNA: Delaney Meigs, CRNA  1.      Managed anesthesia care. 2.      Topical tetracaine drops followed by 2% Xylocaine jelly applied in the preoperative holding area.   COMPLICATIONS:  None.   TECHNIQUE:   Stop and chop   DESCRIPTION OF PROCEDURE:  The patient was examined and consented in the preoperative holding area where the aforementioned topical anesthesia was applied to the right eye and then brought back to the Operating Room where the right eye was prepped and draped in the usual sterile ophthalmic fashion and a lid speculum was placed. A paracentesis was created with the side port blade and the anterior chamber was filled with viscoelastic. A near clear corneal incision was performed with the steel keratome. A continuous curvilinear capsulorrhexis was performed with a cystotome followed by the capsulorrhexis forceps. Hydrodissection and hydrodelineation were carried out with BSS on a blunt cannula. The lens was removed in a stop and chop  technique and the remaining cortical material was removed with the irrigation-aspiration handpiece. The capsular bag was inflated with viscoelastic and the Technis ZCB00  lens was placed in the capsular bag without complication. The remaining viscoelastic was removed from the eye with the irrigation-aspiration handpiece. The wounds were hydrated. The anterior chamber was flushed with Miostat and the eye was inflated to physiologic pressure. 0.1 mL of cefuroxime concentration 10 mg/mL was placed in the anterior chamber. The wounds were found to be water tight. The eye was dressed with Vigamox. The patient was given protective glasses to wear  throughout the day and a shield with which to sleep tonight. The patient was also given drops with which to begin a drop regimen today and will follow-up with me in one day.  Implant Name Type Inv. Item Serial No. Manufacturer Lot No. LRB No. Used  LENS IMPL INTRAOC ZCB00 23.0 - [id number] Intraocular Lens LENS IMPL INTRAOC ZCB00 23.0 [id number]   Right 1   Procedure(s) with comments: CATARACT EXTRACTION PHACO AND INTRAOCULAR LENS PLACEMENT (IOC) (Right) - [place]: 01:12.2 AP%: 20.1 CDE: 14.45 Lot # [id number]  Electronically signed: [place] 09/05/2014 7:48 AM

## 2014-09-05 NOTE — Transfer of Care (Signed)
Immediate Anesthesia Transfer of Care Note  Patient: Yvonne Shaw  Procedure(s) Performed: Procedure(s) with comments: CATARACT EXTRACTION PHACO AND INTRAOCULAR LENS PLACEMENT (IOC) (Right) - [place]: 01:12.2 AP%: 20.1 CDE: 14.45 Lot # [id number]  Patient Location: PACU and Short Stay  Anesthesia Type:MAC  Level of Consciousness: awake, alert  and oriented  Airway & Oxygen Therapy: Patient Spontanous Breathing and Patient connected to nasal cannula oxygen  Post-op Assessment: Report given to RN and Post -op Vital signs reviewed and stable  Post vital signs: Reviewed and stable  Last Vitals: 16 r 155/76 98% sat 62 hr  Filed Vitals:   09/05/14 0616  BP: 143/65  Pulse: 61  Temp: 36.6 C  Resp: 16    Complications: No apparent anesthesia complications

## 2015-03-07 DIAGNOSIS — E7800 Pure hypercholesterolemia, unspecified: Secondary | ICD-10-CM | POA: Diagnosis not present

## 2015-03-07 DIAGNOSIS — I25119 Atherosclerotic heart disease of native coronary artery with unspecified angina pectoris: Secondary | ICD-10-CM | POA: Diagnosis not present

## 2015-03-07 DIAGNOSIS — E119 Type 2 diabetes mellitus without complications: Secondary | ICD-10-CM | POA: Diagnosis not present

## 2015-03-07 DIAGNOSIS — I10 Essential (primary) hypertension: Secondary | ICD-10-CM | POA: Diagnosis not present

## 2015-03-07 DIAGNOSIS — Z23 Encounter for immunization: Secondary | ICD-10-CM | POA: Diagnosis not present

## 2015-04-09 DIAGNOSIS — H2512 Age-related nuclear cataract, left eye: Secondary | ICD-10-CM | POA: Diagnosis not present

## 2015-04-24 ENCOUNTER — Other Ambulatory Visit: Payer: Self-pay | Admitting: Family Medicine

## 2015-04-24 DIAGNOSIS — Z1231 Encounter for screening mammogram for malignant neoplasm of breast: Secondary | ICD-10-CM

## 2015-04-27 ENCOUNTER — Ambulatory Visit
Admission: RE | Admit: 2015-04-27 | Discharge: 2015-04-27 | Disposition: A | Discharge status: Home / Self Care | Payer: 59 | Source: Ambulatory Visit | Attending: Family Medicine | Admitting: Family Medicine

## 2015-04-27 ENCOUNTER — Other Ambulatory Visit: Payer: Self-pay | Admitting: Family Medicine

## 2015-04-27 DIAGNOSIS — Z1231 Encounter for screening mammogram for malignant neoplasm of breast: Secondary | ICD-10-CM

## 2015-04-30 ENCOUNTER — Emergency Department
Admission: EM | Admit: 2015-04-30 | Discharge: 2015-04-30 | Disposition: A | Discharge status: Home / Self Care | Payer: 59 | Attending: Emergency Medicine | Admitting: Emergency Medicine

## 2015-04-30 ENCOUNTER — Encounter: Payer: Self-pay | Admitting: Emergency Medicine

## 2015-04-30 ENCOUNTER — Emergency Department: Payer: 59

## 2015-04-30 DIAGNOSIS — R1031 Right lower quadrant pain: Secondary | ICD-10-CM | POA: Diagnosis not present

## 2015-04-30 DIAGNOSIS — R1030 Lower abdominal pain, unspecified: Secondary | ICD-10-CM

## 2015-04-30 DIAGNOSIS — E669 Obesity, unspecified: Secondary | ICD-10-CM | POA: Diagnosis not present

## 2015-04-30 DIAGNOSIS — I10 Essential (primary) hypertension: Secondary | ICD-10-CM | POA: Insufficient documentation

## 2015-04-30 DIAGNOSIS — Z7984 Long term (current) use of oral hypoglycemic drugs: Secondary | ICD-10-CM | POA: Insufficient documentation

## 2015-04-30 DIAGNOSIS — E119 Type 2 diabetes mellitus without complications: Secondary | ICD-10-CM | POA: Diagnosis not present

## 2015-04-30 DIAGNOSIS — E785 Hyperlipidemia, unspecified: Secondary | ICD-10-CM | POA: Diagnosis not present

## 2015-04-30 DIAGNOSIS — Z7982 Long term (current) use of aspirin: Secondary | ICD-10-CM | POA: Diagnosis not present

## 2015-04-30 DIAGNOSIS — Z8673 Personal history of transient ischemic attack (TIA), and cerebral infarction without residual deficits: Secondary | ICD-10-CM | POA: Diagnosis not present

## 2015-04-30 DIAGNOSIS — I252 Old myocardial infarction: Secondary | ICD-10-CM | POA: Diagnosis not present

## 2015-04-30 DIAGNOSIS — K5792 Diverticulitis of intestine, part unspecified, without perforation or abscess without bleeding: Secondary | ICD-10-CM | POA: Diagnosis not present

## 2015-04-30 DIAGNOSIS — Z79899 Other long term (current) drug therapy: Secondary | ICD-10-CM | POA: Insufficient documentation

## 2015-04-30 DIAGNOSIS — K5732 Diverticulitis of large intestine without perforation or abscess without bleeding: Secondary | ICD-10-CM

## 2015-04-30 LAB — COMPREHENSIVE METABOLIC PANEL
ALT: 21 U/L (ref 14–54)
AST: 21 U/L (ref 15–41)
Albumin: 4 g/dL (ref 3.5–5.0)
Alkaline Phosphatase: 74 U/L (ref 38–126)
Anion gap: 8 (ref 5–15)
BUN: 17 mg/dL (ref 6–20)
CHLORIDE: 109 mmol/L (ref 101–111)
CO2: 22 mmol/L (ref 22–32)
CREATININE: 1.15 mg/dL — AB (ref 0.44–1.00)
Calcium: 9 mg/dL (ref 8.9–10.3)
GFR calc Af Amer: 57 mL/min — ABNORMAL LOW (ref >60)
GFR calc non Af Amer: 50 mL/min — ABNORMAL LOW (ref >60)
Glucose, Bld: 113 mg/dL — ABNORMAL HIGH (ref 65–99)
POTASSIUM: 3.9 mmol/L (ref 3.5–5.1)
SODIUM: 139 mmol/L (ref 135–145)
Total Bilirubin: 0.6 mg/dL (ref 0.3–1.2)
Total Protein: 6.9 g/dL (ref 6.5–8.1)

## 2015-04-30 LAB — CBC WITH DIFFERENTIAL/PLATELET
BASOS ABS: 0 10*3/uL (ref 0–0.1)
BASOS PCT: 0 %
EOS ABS: 0.2 10*3/uL (ref 0–0.7)
EOS PCT: 1 %
HCT: 38 % (ref 35.0–47.0)
Hemoglobin: 13.1 g/dL (ref 12.0–16.0)
Lymphocytes Relative: 15 %
Lymphs Abs: 2.1 10*3/uL (ref 1.0–3.6)
MCH: 28.5 pg (ref 26.0–34.0)
MCHC: 34.6 g/dL (ref 32.0–36.0)
MCV: 82.4 fL (ref 80.0–100.0)
Monocytes Absolute: 0.7 10*3/uL (ref 0.2–0.9)
Monocytes Relative: 5 %
Neutro Abs: 10.5 10*3/uL — ABNORMAL HIGH (ref 1.4–6.5)
Neutrophils Relative %: 79 %
PLATELETS: 220 10*3/uL (ref 150–440)
RBC: 4.61 MIL/uL (ref 3.80–5.20)
RDW: 15 % — ABNORMAL HIGH (ref 11.5–14.5)
WBC: 13.4 10*3/uL — AB (ref 3.6–11.0)

## 2015-04-30 LAB — URINALYSIS COMPLETE WITH MICROSCOPIC (ARMC ONLY)
Bacteria, UA: NONE SEEN
Bilirubin Urine: NEGATIVE
Glucose, UA: NEGATIVE mg/dL
Ketones, ur: NEGATIVE mg/dL
LEUKOCYTES UA: NEGATIVE
Nitrite: NEGATIVE
PH: 5 (ref 5.0–8.0)
PROTEIN: NEGATIVE mg/dL
SPECIFIC GRAVITY, URINE: 1.006 (ref 1.005–1.030)

## 2015-04-30 LAB — LIPASE, BLOOD: Lipase: 22 U/L (ref 11–51)

## 2015-04-30 LAB — TROPONIN I

## 2015-04-30 MED ORDER — METRONIDAZOLE 500 MG PO TABS
500.0000 mg | ORAL_TABLET | Freq: Once | ORAL | Status: AC
  Administered 2015-04-30: 500 mg via ORAL
  Filled 2015-04-30: qty 1

## 2015-04-30 MED ORDER — OXYCODONE-ACETAMINOPHEN 5-325 MG PO TABS: 1.0000 | ORAL_TABLET | Freq: Four times a day (QID) | ORAL | Status: DC | PRN

## 2015-04-30 MED ORDER — ONDANSETRON HCL 4 MG/2ML IJ SOLN
4.0000 mg | Freq: Once | INTRAMUSCULAR | Status: AC
  Administered 2015-04-30: 4 mg via INTRAVENOUS

## 2015-04-30 MED ORDER — MORPHINE SULFATE (PF) 4 MG/ML IV SOLN
4.0000 mg | Freq: Once | INTRAVENOUS | Status: AC
  Administered 2015-04-30: 4 mg via INTRAVENOUS

## 2015-04-30 MED ORDER — ONDANSETRON HCL 4 MG/2ML IJ SOLN
INTRAMUSCULAR | Status: AC
  Administered 2015-04-30: 4 mg via INTRAVENOUS
  Filled 2015-04-30: qty 2

## 2015-04-30 MED ORDER — IOPAMIDOL (ISOVUE-370) INJECTION 76%
100.0000 mL | Freq: Once | INTRAVENOUS | Status: AC | PRN
  Administered 2015-04-30: 100 mL via INTRAVENOUS

## 2015-04-30 MED ORDER — CIPROFLOXACIN HCL 500 MG PO TABS: 500.0000 mg | ORAL_TABLET | Freq: Two times a day (BID) | ORAL | Status: AC

## 2015-04-30 MED ORDER — CIPROFLOXACIN HCL 500 MG PO TABS
500.0000 mg | ORAL_TABLET | Freq: Once | ORAL | Status: AC
  Administered 2015-04-30: 500 mg via ORAL
  Filled 2015-04-30: qty 1

## 2015-04-30 MED ORDER — SODIUM CHLORIDE 0.9 % IV BOLUS (SEPSIS)
500.0000 mL | Freq: Once | INTRAVENOUS | Status: AC
  Administered 2015-04-30: 500 mL via INTRAVENOUS

## 2015-04-30 MED ORDER — MORPHINE SULFATE (PF) 4 MG/ML IV SOLN
INTRAVENOUS | Status: AC
  Administered 2015-04-30: 4 mg via INTRAVENOUS
  Filled 2015-04-30: qty 1

## 2015-04-30 MED ORDER — METRONIDAZOLE 500 MG PO TABS: 500.0000 mg | ORAL_TABLET | Freq: Three times a day (TID) | ORAL | Status: AC

## 2015-04-30 NOTE — ED Notes (Signed)
Pt to CT

## 2015-04-30 NOTE — Discharge Instructions (Signed)
Diverticulitis  Diverticulitis is when small pockets that have formed in your colon (large intestine) become infected or swollen.  HOME CARE  · Follow your doctor's instructions.  · Follow a special diet if told by your doctor.  · When you feel better, your doctor may tell you to change your diet. You may be told to eat a lot of fiber. Fruits and vegetables are good sources of fiber. Fiber makes it easier to poop (have bowel movements).  · Take supplements or probiotics as told by your doctor.  · Only take medicines as told by your doctor.  · Keep all follow-up visits with your doctor.  GET HELP IF:  · Your pain does not get better.  · You have a hard time eating food.  · You are not pooping like normal.  GET HELP RIGHT AWAY IF:  · Your pain gets worse.  · Your problems do not get better.  · Your problems suddenly get worse.  · You have a fever.  · You keep throwing up (vomiting).  · You have bloody or black, tarry poop (stool).  MAKE SURE YOU:   · Understand these instructions.  · Will watch your condition.  · Will get help right away if you are not doing well or get worse.     This information is not intended to replace advice given to you by your health care provider. Make sure you discuss any questions you have with your health care provider.     Document Released: 06/11/2007 Document Revised: 12/28/2012 Document Reviewed: 11/17/2012  Elsevier Interactive Patient Education ©2016 [employer]

## 2015-04-30 NOTE — ED Provider Notes (Signed)
[facility] Emergency Department Provider Note  ____________________________________________  Time seen: Approximately 930 AM  I have reviewed the triage vital signs and the nursing notes.   HISTORY  Chief Complaint Abdominal Pain   HPI Yvonne Shaw is a 65 y.o. female with a history of coronary artery disease with stents on Plavix was presenting to the emergency department today with lower abdominal pain which has been constant since yesterday. She says that she has had this pain on and off for the past 3 months. She says it is worse with eating and in her lower abdomen. However, she says that usually is relieved after several hours after it begins. She describes it as cramping and nonradiating. She denies any shortness of breath or chest pain with it. She denies any burning with urination. No issues with moving her bowels. She says that she is compliant with her Plavix. Denies any nausea or vomiting but does admit to intermittent diarrhea which she says is nonbloody.Says that the pain is a 4 out of 10 at this time after taking Tylenol this morning. Says that ever since yesterday it has been a 10 out of 10.   Past Medical History  Diagnosis Date  . CVA (cerebral infarction)     Residual R leg weakness  . CAD S/P percutaneous coronary angioplasty 03/19/13    s/p 2.25 mm x 15 mm (2.46) DES to mid AV Groove Circumflex  . ST elevation myocardial infarction (STEMI) of inferolateral wall, subsequent episode of care ([place]) 03/19/13    s/p DES to mid AV Groove Circumflex; EF 50% on cath  . Diabetes mellitus type 2 in obese ([place])   . Hypertension   . Hyperlipidemia LDL goal <70   . Obesity   . PVC's (premature ventricular contractions)     seen on telemetry during cardiac rehabilitation  . Nonsustained ventricular tachycardia ([place])     found on exam monitoring, asymptomatic  . Shortness of breath dyspnea     due to brilinta  . Stroke ([place])     2001  . GERD  (gastroesophageal reflux disease)   . Anginal pain ([place])   . Orthopnea     Patient Active Problem List   Diagnosis Date Noted  . PVC's (premature ventricular contractions) 08/26/2013  . Chest pain with moderate risk for cardiac etiology 05/28/2013  . Sinus bradycardia 04/01/2013  . Shortness of breath 03/29/2013  . HTN (hypertension) 03/20/2013  . Dyslipidemia, goal LDL below 70 03/20/2013  . Diabetes mellitus ([place]) 03/20/2013  . STEMI - s/p DES to mid AV groove Circ 03/19/13; EF 50% on cath 03/19/2013  . ST elevation myocardial infarction (STEMI) of inferior wall ([place]) 03/19/2013  . CAD S/P percutaneous coronary angioplasty 03/19/2013    Past Surgical History  Procedure Laterality Date  . Abdominal hysterectomy    . Cholecystectomy    . Percutaneous coronary stent intervention (pci-s)  03/19/2013    PCI to mCx - 90% lesion: 2.25 mm x 15 mm Xience [place] DES (2.46 mm post-dilation); EF ~50%  . Left heart catheterization with coronary angiogram N/A 03/19/2013    Procedure: LEFT HEART CATHETERIZATION WITH CORONARY ANGIOGRAM;  Surgeon: Lorretta Harp, MD;  Location: [facility] CATH LAB;  Service: Cardiovascular;  Laterality: N/A;  . Cataract extraction w/phaco Right 09/05/2014    Procedure: CATARACT EXTRACTION PHACO AND INTRAOCULAR LENS PLACEMENT (IOC);  Surgeon: Birder Robson, MD;  Location: [facility] ORS;  Service: Ophthalmology;  Laterality: Right;  [place]: 01:12.2 AP%: 20.1 CDE: 14.45 Lot #  [id number]    Current Outpatient Rx  Name  Route  Sig  Dispense  Refill  . aspirin 81 MG tablet   Oral   Take 81 mg by mouth daily.         [name] atorvastatin (LIPITOR) 80 MG tablet   Oral   Take 1 tablet (80 mg total) by mouth daily at 6 PM.   30 tablet   5   . Blood Glucose Monitoring Suppl (ONE TOUCH ULTRA SYSTEM KIT) W/DEVICE KIT   Does not apply   1 kit by Does not apply route once.   1 each   0   . clopidogrel (PLAVIX) 75 MG tablet   Oral   Take 1 tablet (75 mg total) by mouth  daily. Patient not taking: Reported on 09/05/2014   90 tablet   3   . hydrochlorothiazide (MICROZIDE) 12.5 MG capsule   Oral   Take 1 capsule (12.5 mg total) by mouth daily.   30 capsule   6   . lisinopril (PRINIVIL,ZESTRIL) 20 MG tablet   Oral   Take 20 mg by mouth daily.         . metFORMIN (GLUCOPHAGE) 500 MG tablet   Oral   Take 1 tablet (500 mg total) by mouth 2 (two) times daily with a meal.   60 tablet   5   . metoprolol tartrate (LOPRESSOR) 25 MG tablet   Oral   Take 0.5 tablets (12.5 mg total) by mouth 2 (two) times daily.   60 tablet   5   . nitroGLYCERIN (NITROSTAT) 0.4 MG SL tablet   Sublingual   Place 1 tablet (0.4 mg total) under the tongue every 5 (five) minutes x 3 doses as needed for chest pain.   25 tablet   2   . Ticagrelor (BRILINTA PO)   Oral   Take 90 mg by mouth 2 (two) times daily.           Allergies Aggrenox and Carvedilol  Family History  Problem Relation Age of Onset  . Cancer Mother   . Diabetes Mother   . Hypertension Mother   . Hypertension Father   . Cancer Father   . Diabetes Father   . Hypertension Sister   . Diabetes Sister   . Heart failure Brother   . Diabetes Brother   . Heart attack Maternal Grandfather     Social History Social History  Substance Use Topics  . Smoking status: Never Smoker   . Smokeless tobacco: None  . Alcohol Use: No    Review of Systems Constitutional: No fever/chills Eyes: No visual changes. ENT: No sore throat. Cardiovascular: Denies chest pain. Respiratory: Denies shortness of breath. Gastrointestinal:   No nausea, no vomiting.    No constipation. Genitourinary: Negative for dysuria. Musculoskeletal: Negative for back pain. Skin: Negative for rash. Neurological: Negative for headaches, focal weakness or numbness.  10-point ROS otherwise negative.  ____________________________________________   PHYSICAL EXAM:  VITAL SIGNS: ED Triage Vitals  Enc Vitals Group     BP  04/30/15 0912 193/80 mmHg     Pulse Rate 04/30/15 0912 62     Resp 04/30/15 0912 20     Temp 04/30/15 0912 97.8 F (36.6 C)     Temp Source 04/30/15 0912 Oral     SpO2 04/30/15 0912 97 %     Weight 04/30/15 0912 239 lb (108.41 kg)     Height 04/30/15 0912 '5\' 7"'$  (1.702 m)     Head  Cir --      Peak Flow --      Pain Score 04/30/15 0913 5     Pain Loc --      Pain Edu? --      Excl. in [place]? --     Constitutional: Alert and oriented. Well appearing and in no acute distress. Eyes: Conjunctivae are normal. PERRL. EOMI. Head: Atraumatic. Nose: No congestion/rhinnorhea. Mouth/Throat: Mucous membranes are moist.   Neck: No stridor.   Cardiovascular: Normal rate, regular rhythm. Grossly normal heart sounds.  Respiratory: Normal respiratory effort.  No retractions. Lungs CTAB. Gastrointestinal: Soft with tenderness to the suprapubic region as well as the right lower quadrant. There is guarding to the right lower quadrant. No distention. No CVA tenderness. Musculoskeletal: No lower extremity tenderness nor edema.  No joint effusions. Neurologic:  Normal speech and language. No gross focal neurologic deficits are appreciated. Skin:  Skin is warm, dry and intact. No rash noted. Psychiatric: Mood and affect are normal. Speech and behavior are normal.  ____________________________________________   LABS (all labs ordered are listed, but only abnormal results are displayed)  Labs Reviewed  CBC WITH DIFFERENTIAL/PLATELET - Abnormal; Notable for the following:    WBC 13.4 (*)    RDW 15.0 (*)    Neutro Abs 10.5 (*)    All other components within normal limits  COMPREHENSIVE METABOLIC PANEL - Abnormal; Notable for the following:    Glucose, Bld 113 (*)    Creatinine, Ser 1.15 (*)    GFR calc non Af Amer 50 (*)    GFR calc Af Amer 57 (*)    All other components within normal limits  URINALYSIS COMPLETEWITH MICROSCOPIC ([facility] ONLY) - Abnormal; Notable for the following:    Color, Urine STRAW  (*)    APPearance CLEAR (*)    Hgb urine dipstick 1+ (*)    Squamous Epithelial / LPF 0-5 (*)    All other components within normal limits  LIPASE, BLOOD  TROPONIN I   ____________________________________________  EKG  ED ECG REPORT I, Doran Stabler, the attending physician, personally viewed and interpreted this ECG.   Date: 04/30/2015  EKG Time: 935  Rate: 61  Rhythm: normal sinus rhythm  Axis: Normal axis  Intervals:none  ST&T Change: no st segment elevation or depression.  No abnormal t wave inversion.    ____________________________________________  RADIOLOGY   IMPRESSION: 1. CT findings are most consistent with acute uncomplicated sigmoid diverticulitis. Given the focality of the abnormality, a colonic neoplasm is not excluded. Recommend colonoscopy following resolution of the patient's acute symptoms if 1 has not been performed recently. 2. No evidence of significant atherosclerotic vascular disease, dissection, aneurysm or other acute vascular abnormality. 3. Borderline cardiomegaly. 4. Hepatic and bilateral renal cysts. 5. Multilevel lower lumbar degenerative disc disease and bilateral facet arthropathy. 6. Fat containing supraumbilical ventral hernia.   Electronically Signed By: Jacqulynn Cadet M.D. On: 04/30/2015 12:01       ____________________________________________   PROCEDURES  ____________________________________________   INITIAL IMPRESSION / ASSESSMENT AND PLAN / ED COURSE  Pertinent labs & imaging results that were available during my care of the patient were reviewed by me and considered in my medical decision making (see chart for details).  ----------------------------------------- 12:29 PM on 04/30/2015 -----------------------------------------  Patient resting comfortably at this time. Required morphine which she said helped greatly with her pain control. CAT scan findings are consistent with acute uncomplicated  sigmoid diverticulitis. However, neoplasm is not excluded. I discussed these findings  with patient and she is already scheduled appointment with her gastroenterologist on May 15. However, I did discuss with her this is a little bit longer than I would suggest for follow-up after an emergency department visit. She will try to call back to her gastroenterologist, Dr. Vira Agar, to move up the appointment. However, she is not able to move up the appointment she says that she will follow up with her primary care doctor within the next week. Charge him with Cipro and Flagyl as well as a short course of when necessary Percocet. ____________________________________________   FINAL CLINICAL IMPRESSION(S) / ED DIAGNOSES  Final diagnoses:  Lower abdominal pain  Acute diverticulitis.    Orbie Pyo, MD 04/30/15 1230

## 2015-04-30 NOTE — ED Notes (Signed)
Pt bradycardic MD made aware

## 2015-04-30 NOTE — ED Notes (Signed)
Pt presents with intermittent abdominal pain x 3 months that got worse over the weekend. Pt has taken OTC meds but they are not helping. Pt alert & oriented with mild.

## 2015-05-11 DIAGNOSIS — L811 Chloasma: Secondary | ICD-10-CM | POA: Diagnosis not present

## 2015-05-11 DIAGNOSIS — D2371 Other benign neoplasm of skin of right lower limb, including hip: Secondary | ICD-10-CM | POA: Diagnosis not present

## 2015-05-21 DIAGNOSIS — R933 Abnormal findings on diagnostic imaging of other parts of digestive tract: Secondary | ICD-10-CM

## 2015-05-21 DIAGNOSIS — Z86010 Personal history of colonic polyps: Secondary | ICD-10-CM | POA: Diagnosis not present

## 2015-05-21 HISTORY — DX: Abnormal findings on diagnostic imaging of other parts of digestive tract: R93.3

## 2015-05-22 ENCOUNTER — Telehealth: Payer: Self-pay

## 2015-05-22 NOTE — Telephone Encounter (Signed)
Requesting surgical clearance:   1. Type of surgery: Colonoscopy  2. Surgeon: Dr. Gaylyn Cheers  3. Surgical date: 07/02/2015   4. Medications that need to be held: Plavix for 5 days  5. CAD: Yes     6. I will defer to: Gwenlyn Found  **Pt has appointment on 06/12/2015  [facility] Attn: Dr. Verne Spurr, NP [phone] Phone [phone] Fax

## 2015-05-22 NOTE — Telephone Encounter (Signed)
OK to interrupt anti platelet Rx 

## 2015-05-23 NOTE — Telephone Encounter (Signed)
Clearance faxed to [facility]

## 2015-06-12 ENCOUNTER — Ambulatory Visit (INDEPENDENT_AMBULATORY_CARE_PROVIDER_SITE_OTHER): Payer: 59 | Admitting: Cardiovascular Disease

## 2015-06-12 ENCOUNTER — Encounter: Payer: Self-pay | Admitting: Cardiovascular Disease

## 2015-06-12 VITALS — BP 141/74 | HR 63 | Ht 67.0 in | Wt 243.8 lb

## 2015-06-12 DIAGNOSIS — I10 Essential (primary) hypertension: Secondary | ICD-10-CM

## 2015-06-12 DIAGNOSIS — E785 Hyperlipidemia, unspecified: Secondary | ICD-10-CM

## 2015-06-12 DIAGNOSIS — I2119 ST elevation (STEMI) myocardial infarction involving other coronary artery of inferior wall: Secondary | ICD-10-CM | POA: Diagnosis not present

## 2015-06-12 NOTE — Assessment & Plan Note (Signed)
History of CAD status post inferior STEMI 3/14/15where catheterization showed high-grade mid AV groove circumflex which I stented using a drug-eluting stent. She was discharged home 2 days later and had a subsequent negative Myoview. Over the last year she has remained currently stable and asymptomatic. Her Brilenta was transitioned to Plavix because of side effects.

## 2015-06-12 NOTE — Patient Instructions (Signed)

## 2015-06-12 NOTE — Assessment & Plan Note (Signed)
History of hyperlipidemia on statin therapy followed by her PCP. 

## 2015-06-12 NOTE — Progress Notes (Signed)
06/12/2015 Yvonne Shaw   02/05/1950  [id number]  Primary Physician WHITE, Orlene Och, NP Primary Cardiologist: Lorretta Harp MD [place], FSCAI   HPI:   Yvonne Shaw is a 65y.o. female Married Yvonne Shaw female mother of 2, grandmother and 6 grandchildren he works as a Marine scientist at [employer]. I last saw her in the office 05/24/14.Marland KitchenShe had no prior cardiac history, PMHx s/f h/o CVA, HTN, HLD and obesity who was admitted to [facility] on 03/19/13 with an inferior STEMI. She reports being in her USOH until ~ 9PM last night when she began experiencing intermittent substernal chest burning w/o radiation or associated symptoms. She was not initially concerned and attributed this to indigestion. She went to sleep and was awoken at 6AM by the same discomfort which had increased to a 9/10 in intensity and with associated nausea. She got up and tried to busy herself around the house, however the discomfort persisted. She alerted her husband who called 911. Serial EKG tracings in the field reveal fluctuating ST segments (25m elevation to isoelectric) in leads III, aVF. [facility] Code [facility] was [facility]. She did receive ASA x 4 and heparin bolus. [facility]. 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 She was just been discharged 2 days later. Her only problems have been episodic shortness of breath probably related to the BFlatoniashe did have an episode of chest pain in May [facility]. While performing cardiac rehabilitation care it was noted that she had frequent PVCs on telemetry although she was unaware of this.patient wore a 30 day event monitor which did show some short runs of nonsustained ventricular tachycardia. Since I saw her one year ago she has remained  stable. Her Brilenta was changed to Plavix because of shortness of breath which has since resolved. She is otherwise asymptomatic..   Current Outpatient Prescriptions  Medication Sig Dispense Refill  . aspirin 81 MG tablet Take 81 mg by mouth daily.    .Marland Kitchenatorvastatin (LIPITOR) 80 MG tablet Take 1 tablet (80 mg total) by mouth daily at 6 PM. 30 tablet 5  . Blood Glucose Monitoring Suppl (ONE TOUCH ULTRA SYSTEM KIT) W/DEVICE KIT 1 kit by Does not apply route once. 1 each 0  . clopidogrel (PLAVIX) 75 MG tablet Take 1 tablet (75 mg total) by mouth daily. 90 tablet 3  . hydrochlorothiazide (MICROZIDE) 12.5 MG capsule Take 1 capsule (12.5 mg total) by mouth daily. 30 capsule 6  . lisinopril (PRINIVIL,ZESTRIL) 20 MG tablet Take 20 mg by mouth daily.    . metFORMIN (GLUCOPHAGE) 500 MG tablet Take 1 tablet (500 mg total) by mouth 2 (two) times daily with a meal. 60 tablet 5  . metoprolol tartrate (LOPRESSOR) 25 MG tablet Take 0.5 tablets (12.5 mg total) by mouth 2 (two) times daily. 60 tablet 5  . nitroGLYCERIN (NITROSTAT) 0.4 MG SL tablet Place 1 tablet (0.4 mg total) under the tongue every 5 (five) minutes x 3 doses as needed for chest pain. 25 tablet 2  . oxyCODONE-acetaminophen (ROXICET) 5-325 MG tablet Take 1-2 tablets by mouth every 6 (six) hours as needed. 10 tablet 0  . Ticagrelor (BRILINTA PO) Take 90 mg by mouth 2 (two) times daily.     No current facility-administered medications for this visit.    Allergies  Allergen Reactions  . Aggrenox [Aspirin-Dipyridamole Er] Other (See Comments), Nausea Only and Nausea And Vomiting    Severe headache  . Carvedilol Other (See Comments)    caused fatigue    Social History   Social History  . Marital Status: Married    Spouse Name: N/A  . Number of Children: N/A  . Years of Education: N/A   Occupational History  . Not on file.   Social History Main Topics  . Smoking status: Never Smoker   . Smokeless tobacco: Not on file  . Alcohol  Use: No  . Drug Use: No  . Sexual Activity: Not on file   Other Topics Concern  . Not on file   Social History Narrative   Married, 2 healthy children. Lives in [place], [place]. Works at [employer].     Review of Systems: General: negative for chills, fever, night sweats or weight changes.  Cardiovascular: negative for chest pain, dyspnea on exertion, edema, orthopnea, palpitations, paroxysmal nocturnal dyspnea or shortness of breath Dermatological: negative for rash Respiratory: negative for cough or wheezing Urologic: negative for hematuria Abdominal: negative for nausea, vomiting, diarrhea, bright red blood per rectum, melena, or hematemesis Neurologic: negative for visual changes, syncope, or dizziness All other systems reviewed and are otherwise negative except as noted above.    Blood pressure 141/74, pulse 63, height '5\' 7"'$  (1.702 m), weight 243 lb 12.8 oz (110.587 kg).  General appearance: alert and no distress Neck: no adenopathy, no carotid bruit, no JVD, supple, symmetrical, trachea midline and thyroid not enlarged, symmetric, no tenderness/mass/nodules Lungs: clear to auscultation bilaterally Heart: regular rate and rhythm, S1, S2 normal, no murmur, click, rub or gallop Extremities: extremities normal, atraumatic, no cyanosis or edema  EKG not performed today  ASSESSMENT AND PLAN:   ST elevation myocardial infarction (STEMI) of inferior wall History of CAD status post inferior STEMI 3/14/15where catheterization showed high-grade mid AV groove circumflex which I stented using a drug-eluting stent. She was discharged home 2 days later and had a subsequent negative Myoview. Over the last year she has remained currently stable and asymptomatic. Her Brilenta was transitioned to Plavix because of side effects.  HTN (hypertension) istory of hypertension and blood pressure measured 141/74. She is on lisinopril, hydrochlorothiazide and metoprolol. Continue current meds at current  dosing  Dyslipidemia, goal LDL below 70 History of hyperlipidemia on statin therapy followed by her PCP      Lorretta Harp MD [facility], [facility] 06/12/2015 9:27 AM

## 2015-06-12 NOTE — Assessment & Plan Note (Signed)
istory of hypertension and blood pressure measured 141/74. She is on lisinopril, hydrochlorothiazide and metoprolol. Continue current meds at current dosing

## 2015-07-02 ENCOUNTER — Encounter: Payer: Self-pay | Admitting: Anesthesiology

## 2015-07-02 ENCOUNTER — Ambulatory Visit: Payer: 59 | Admitting: Anesthesiology

## 2015-07-02 ENCOUNTER — Ambulatory Visit
Admission: RE | Admit: 2015-07-02 | Discharge: 2015-07-02 | Disposition: A | Discharge status: Home / Self Care | Payer: 59 | Source: Ambulatory Visit | Attending: Unknown Physician Specialty | Admitting: Unknown Physician Specialty

## 2015-07-02 ENCOUNTER — Encounter
Admission: RE | Disposition: A | Discharge status: Home / Self Care | Payer: Self-pay | Source: Ambulatory Visit | Attending: Unknown Physician Specialty

## 2015-07-02 DIAGNOSIS — I69851 Hemiplegia and hemiparesis following other cerebrovascular disease affecting right dominant side: Secondary | ICD-10-CM | POA: Insufficient documentation

## 2015-07-02 DIAGNOSIS — E119 Type 2 diabetes mellitus without complications: Secondary | ICD-10-CM | POA: Diagnosis not present

## 2015-07-02 DIAGNOSIS — Z7984 Long term (current) use of oral hypoglycemic drugs: Secondary | ICD-10-CM | POA: Insufficient documentation

## 2015-07-02 DIAGNOSIS — Z7902 Long term (current) use of antithrombotics/antiplatelets: Secondary | ICD-10-CM | POA: Insufficient documentation

## 2015-07-02 DIAGNOSIS — Z7982 Long term (current) use of aspirin: Secondary | ICD-10-CM | POA: Insufficient documentation

## 2015-07-02 DIAGNOSIS — K5790 Diverticulosis of intestine, part unspecified, without perforation or abscess without bleeding: Secondary | ICD-10-CM | POA: Diagnosis not present

## 2015-07-02 DIAGNOSIS — E785 Hyperlipidemia, unspecified: Secondary | ICD-10-CM | POA: Insufficient documentation

## 2015-07-02 DIAGNOSIS — Z1211 Encounter for screening for malignant neoplasm of colon: Secondary | ICD-10-CM | POA: Insufficient documentation

## 2015-07-02 DIAGNOSIS — I252 Old myocardial infarction: Secondary | ICD-10-CM | POA: Insufficient documentation

## 2015-07-02 DIAGNOSIS — I10 Essential (primary) hypertension: Secondary | ICD-10-CM | POA: Insufficient documentation

## 2015-07-02 DIAGNOSIS — I2510 Atherosclerotic heart disease of native coronary artery without angina pectoris: Secondary | ICD-10-CM | POA: Diagnosis not present

## 2015-07-02 DIAGNOSIS — Z79891 Long term (current) use of opiate analgesic: Secondary | ICD-10-CM | POA: Insufficient documentation

## 2015-07-02 DIAGNOSIS — K219 Gastro-esophageal reflux disease without esophagitis: Secondary | ICD-10-CM | POA: Diagnosis not present

## 2015-07-02 DIAGNOSIS — Z86010 Personal history of colonic polyps: Secondary | ICD-10-CM | POA: Insufficient documentation

## 2015-07-02 DIAGNOSIS — K648 Other hemorrhoids: Secondary | ICD-10-CM | POA: Diagnosis not present

## 2015-07-02 DIAGNOSIS — K640 First degree hemorrhoids: Secondary | ICD-10-CM | POA: Diagnosis not present

## 2015-07-02 DIAGNOSIS — Z79899 Other long term (current) drug therapy: Secondary | ICD-10-CM | POA: Diagnosis not present

## 2015-07-02 DIAGNOSIS — Z955 Presence of coronary angioplasty implant and graft: Secondary | ICD-10-CM | POA: Insufficient documentation

## 2015-07-02 DIAGNOSIS — K5730 Diverticulosis of large intestine without perforation or abscess without bleeding: Secondary | ICD-10-CM | POA: Insufficient documentation

## 2015-07-02 HISTORY — PX: COLONOSCOPY WITH PROPOFOL: SHX5780

## 2015-07-02 LAB — GLUCOSE, CAPILLARY: GLUCOSE-CAPILLARY: 128 mg/dL — AB (ref 65–99)

## 2015-07-02 SURGERY — COLONOSCOPY WITH PROPOFOL
Anesthesia: General

## 2015-07-02 MED ORDER — PROPOFOL 500 MG/50ML IV EMUL
INTRAVENOUS | Status: DC | PRN
  Administered 2015-07-02: 150 ug/kg/min via INTRAVENOUS
  Administered 2015-07-02: 100 ug/kg/min via INTRAVENOUS

## 2015-07-02 MED ORDER — LIDOCAINE HCL (CARDIAC) 20 MG/ML IV SOLN
INTRAVENOUS | Status: DC | PRN
  Administered 2015-07-02: 50 mg via INTRAVENOUS

## 2015-07-02 MED ORDER — PROPOFOL 10 MG/ML IV BOLUS
INTRAVENOUS | Status: DC | PRN
  Administered 2015-07-02: 3 mg via INTRAVENOUS
  Administered 2015-07-02: 20 mg via INTRAVENOUS

## 2015-07-02 MED ORDER — SODIUM CHLORIDE 0.9 % IV SOLN: INTRAVENOUS | Status: DC

## 2015-07-02 MED ORDER — SODIUM CHLORIDE 0.9 % IV SOLN
INTRAVENOUS | Status: DC
  Administered 2015-07-02: 1000 mL via INTRAVENOUS

## 2015-07-02 NOTE — Op Note (Signed)
[facility] Gastroenterology Patient Name: Yvonne Shaw Procedure Date: 07/02/2015 8:38 AM MRN: [id number] Account #: [id number] Date of Birth: 1950/02/13 Admit Type: Outpatient Age: 65 Room: [facility] ENDO ROOM 1 Gender: Female Note Status: Finalized Procedure:            Colonoscopy Indications:          High risk colon cancer surveillance: Personal history                        of colonic polyps Providers:            Manya Silvas, MD Referring MD:         Dani Gobble. White, MD (Referring MD) Medicines:            Propofol per Anesthesia Complications:        No immediate complications. Procedure:            Pre-Anesthesia Assessment:                       - After reviewing the risks and benefits, the patient                        was deemed in satisfactory condition to undergo the                        procedure.                       After obtaining informed consent, the colonoscope was                        passed under direct vision. Throughout the procedure,                        the patient's blood pressure, pulse, and oxygen                        saturations were monitored continuously. The                        Colonoscope was introduced through the anus and                        advanced to the the cecum, identified by appendiceal                        orifice and ileocecal valve. The colonoscopy was                        somewhat difficult due to a tortuous colon. Successful                        completion of the procedure was aided by applying                        abdominal pressure. The patient tolerated the procedure                        well. The quality of the bowel preparation was good. Findings:      Multiple medium-mouthed diverticula were found in the sigmoid colon and  descending colon.      Internal hemorrhoids were found during endoscopy. The hemorrhoids were       small and Grade I (internal hemorrhoids that do not  prolapse).      The exam was otherwise without abnormality. Impression:           - Diverticulosis in the sigmoid colon and in the                        descending colon.                       - Internal hemorrhoids.                       - The examination was otherwise normal.                       - No specimens collected. Recommendation:       - Repeat colonoscopy in 5 years for surveillance. Manya Silvas, MD 07/02/2015 9:13:01 AM This report has been signed electronically. Number of Addenda: 0 Note Initiated On: 07/02/2015 8:38 AM Scope Withdrawal Time: 0 hours 7 minutes 6 seconds  Total Procedure Duration: 0 hours 18 minutes 56 seconds       [facility]

## 2015-07-02 NOTE — Anesthesia Preprocedure Evaluation (Signed)
Anesthesia Evaluation  Patient identified by MRN, date of birth, ID band Patient awake    Reviewed: Allergy & Precautions, NPO status , Patient's Chart, lab work & pertinent test results, reviewed documented beta blocker date and time   Airway Mallampati: III  TM Distance: >3 FB Neck ROM: Full    Dental no notable dental hx. (+) Caps   Pulmonary shortness of breath and with exertion,    Pulmonary exam normal        Cardiovascular hypertension, Pt. on medications and Pt. on home beta blockers + angina with exertion + CAD, + Past MI and + Orthopnea  Normal cardiovascular exam     Neuro/Psych r leg weakness CVA, No Residual Symptoms negative psych ROS   GI/Hepatic Neg liver ROS, GERD  Medicated and Controlled,  Endo/Other  diabetes, Well Controlled, Type 2, Oral Hypoglycemic Agents  Renal/GU   negative genitourinary   Musculoskeletal negative musculoskeletal ROS (+)   Abdominal Normal abdominal exam  (+)   Peds negative pediatric ROS (+)  Hematology negative hematology ROS (+)   Anesthesia Other Findings   Reproductive/Obstetrics                             Anesthesia Physical  Anesthesia Plan  ASA: III  Anesthesia Plan: General   Post-op Pain Management:    Induction: Intravenous  Airway Management Planned: Nasal Cannula  Additional Equipment:   Intra-op Plan:   Post-operative Plan:   Informed Consent: I have reviewed the patients History and Physical, chart, labs and discussed the procedure including the risks, benefits and alternatives for the proposed anesthesia with the patient or authorized representative who has indicated his/her understanding and acceptance.   Dental advisory given  Plan Discussed with: Surgeon and CRNA  Anesthesia Plan Comments:        Anesthesia Quick Evaluation

## 2015-07-02 NOTE — Transfer of Care (Signed)
Immediate Anesthesia Transfer of Care Note  Patient: Yvonne Shaw  Procedure(s) Performed: Procedure(s): COLONOSCOPY WITH PROPOFOL (N/A)  Patient Location: PACU  Anesthesia Type:MAC  Level of Consciousness: awake  Airway & Oxygen Therapy: Patient Spontanous Breathing  Post-op Assessment: Report given to RN  Post vital signs: stable  Last Vitals:  Filed Vitals:   07/02/15 0811 07/02/15 0913  BP: 136/75 106/75  Pulse: 70 71  Temp: 35.9 C 35.9 C  Resp: 16 18    Last Pain: There were no vitals filed for this visit.       Complications: No apparent anesthesia complications

## 2015-07-02 NOTE — H&P (Signed)
Primary Care Physician:  WHITE, Orlene Och, NP Primary Gastroenterologist:  Dr. Vira Agar  Pre-Procedure History & Physical: HPI:  Yvonne Shaw is a 65 y.o. female is here for an colonoscopy.   Past Medical History  Diagnosis Date  . CVA (cerebral infarction)     Residual R leg weakness  . CAD S/P percutaneous coronary angioplasty 03/19/13    s/p 2.25 mm x 15 mm (2.46) DES to mid AV Groove Circumflex  . ST elevation myocardial infarction (STEMI) of inferolateral wall, subsequent episode of care ([place]) 03/19/13    s/p DES to mid AV Groove Circumflex; EF 50% on cath  . Diabetes mellitus type 2 in obese ([place])   . Hypertension   . Hyperlipidemia LDL goal <70   . Obesity   . PVC's (premature ventricular contractions)     seen on telemetry during cardiac rehabilitation  . Nonsustained ventricular tachycardia ([place])     found on exam monitoring, asymptomatic  . Shortness of breath dyspnea     due to brilinta  . Stroke ([place])     2001  . GERD (gastroesophageal reflux disease)   . Anginal pain ([place])   . Orthopnea     Past Surgical History  Procedure Laterality Date  . Abdominal hysterectomy    . Cholecystectomy    . Percutaneous coronary stent intervention (pci-s)  03/19/2013    PCI to mCx - 90% lesion: 2.25 mm x 15 mm Xience Alpine DES (2.46 mm post-dilation); EF ~50%  . Left heart catheterization with coronary angiogram N/A 03/19/2013    Procedure: LEFT HEART CATHETERIZATION WITH CORONARY ANGIOGRAM;  Surgeon: Lorretta Harp, MD;  Location: [facility] CATH LAB;  Service: Cardiovascular;  Laterality: N/A;  . Cataract extraction w/phaco Right 09/05/2014    Procedure: CATARACT EXTRACTION PHACO AND INTRAOCULAR LENS PLACEMENT (IOC);  Surgeon: Birder Robson, MD;  Location: [facility] ORS;  Service: Ophthalmology;  Laterality: Right;  [place]: 01:12.2 AP%: 20.1 CDE: 14.45 Lot # [id number]    Prior to Admission medications   Medication Sig Start Date End Date Taking? Authorizing Provider   amLODipine (NORVASC) 10 MG tablet Take 10 mg by mouth daily.   Yes Historical Provider, MD  aspirin 81 MG tablet Take 81 mg by mouth daily.   Yes Historical Provider, MD  atorvastatin (LIPITOR) 80 MG tablet Take 1 tablet (80 mg total) by mouth daily at 6 PM. 08/10/13  Yes Brittainy Erie Noe, PA-C  Blood Glucose Monitoring Suppl (ONE TOUCH ULTRA SYSTEM KIT) W/DEVICE KIT 1 kit by Does not apply route once. 03/21/13  Yes Brittainy Erie Noe, PA-C  clopidogrel (PLAVIX) 75 MG tablet Take 1 tablet (75 mg total) by mouth daily. 05/24/14  Yes Lorretta Harp, MD  hydrochlorothiazide (MICROZIDE) 12.5 MG capsule Take 1 capsule (12.5 mg total) by mouth daily. 08/10/13  Yes Brittainy Erie Noe, PA-C  lisinopril (PRINIVIL,ZESTRIL) 20 MG tablet Take 20 mg by mouth daily.   Yes Historical Provider, MD  metFORMIN (GLUCOPHAGE) 500 MG tablet Take 1 tablet (500 mg total) by mouth 2 (two) times daily with a meal. 03/22/13  Yes Brittainy Erie Noe, PA-C  metoprolol tartrate (LOPRESSOR) 25 MG tablet Take 0.5 tablets (12.5 mg total) by mouth 2 (two) times daily. 08/26/13  Yes Lorretta Harp, MD  nitroGLYCERIN (NITROSTAT) 0.4 MG SL tablet Place 1 tablet (0.4 mg total) under the tongue every 5 (five) minutes x 3 doses as needed for chest pain. 03/21/13  Yes Brittainy Erie Noe, PA-C  oxyCODONE-acetaminophen (ROXICET) 5-325 MG tablet  Take 1-2 tablets by mouth every 6 (six) hours as needed. 04/30/15  Yes Orbie Pyo, MD  Ticagrelor (BRILINTA PO) Take 90 mg by mouth 2 (two) times daily. Reported on 07/02/2015    Historical Provider, MD    Allergies as of 06/12/2015 - Review Complete 06/12/2015  Allergen Reaction Noted  . Aggrenox [aspirin-dipyridamole er] Other (See Comments), Nausea Only, and Nausea And Vomiting 03/19/2013  . Carvedilol Other (See Comments) 08/10/2013    Family History  Problem Relation Age of Onset  . Cancer Mother   . Diabetes Mother   . Hypertension Mother   . Hypertension Father   . Cancer  Father   . Diabetes Father   . Hypertension Sister   . Diabetes Sister   . Heart failure Brother   . Diabetes Brother   . Heart attack Maternal Grandfather     Social History   Social History  . Marital Status: Married    Spouse Name: N/A  . Number of Children: N/A  . Years of Education: N/A   Occupational History  . Not on file.   Social History Main Topics  . Smoking status: Never Smoker   . Smokeless tobacco: Not on file  . Alcohol Use: No  . Drug Use: No  . Sexual Activity: Not on file   Other Topics Concern  . Not on file   Social History Narrative   Married, 2 healthy children. Lives in [place], [place]. Works at [employer].    Review of Systems: See HPI, otherwise negative ROS  Physical Exam: BP 136/75 mmHg  Pulse 70  Temp(Src) 96.6 F (35.9 C) (Tympanic)  Resp 16  Ht 5' 6.5" (1.689 m)  Wt 108.41 kg (239 lb)  BMI 38.00 kg/m2  SpO2 99% General:   Alert,  pleasant and cooperative in NAD Head:  Normocephalic and atraumatic. Neck:  Supple; no masses or thyromegaly. Lungs:  Clear throughout to auscultation.    Heart:  Regular rate and rhythm. Abdomen:  Soft, nontender and nondistended. Normal bowel sounds, without guarding, and without rebound.   Neurologic:  Alert and  oriented x4;  grossly normal neurologically.  Impression/Plan: Yvonne Shaw is here for an colonoscopy to be performed for [facility] colon polyps.  Risks, benefits, limitations, and alternatives regarding  colonoscopy have been reviewed with the patient.  Questions have been answered.  All parties agreeable.   Gaylyn Cheers, MD  07/02/2015, 8:42 AM

## 2015-07-03 ENCOUNTER — Encounter: Payer: Self-pay | Admitting: Unknown Physician Specialty

## 2015-07-03 NOTE — Anesthesia Postprocedure Evaluation (Signed)
Anesthesia Post Note  Patient: Yvonne Shaw  Procedure(s) Performed: Procedure(s) (LRB): COLONOSCOPY WITH PROPOFOL (N/A)  Patient location during evaluation: PACU Anesthesia Type: General Level of consciousness: awake and alert and oriented Pain management: pain level controlled Vital Signs Assessment: post-procedure vital signs reviewed and stable Respiratory status: spontaneous breathing Cardiovascular status: blood pressure returned to baseline Anesthetic complications: no    Last Vitals:  Filed Vitals:   07/02/15 0811 07/02/15 0913  BP: 136/75 106/75  Pulse: 70 71  Temp: 35.9 C 35.9 C  Resp: 16 18    Last Pain:  Filed Vitals:   07/03/15 0750  PainSc: 0-No pain                 [name]

## 2015-09-12 DIAGNOSIS — H43811 Vitreous degeneration, right eye: Secondary | ICD-10-CM | POA: Diagnosis not present

## 2015-09-24 DIAGNOSIS — M7989 Other specified soft tissue disorders: Secondary | ICD-10-CM | POA: Diagnosis not present

## 2015-09-24 DIAGNOSIS — M25471 Effusion, right ankle: Secondary | ICD-10-CM | POA: Diagnosis not present

## 2015-09-24 DIAGNOSIS — M25571 Pain in right ankle and joints of right foot: Secondary | ICD-10-CM | POA: Diagnosis not present

## 2015-12-16 IMAGING — CR DG CHEST 1V PORT
1 series · 1 of 1 positions shown · non-contrast
Comparison: 03/18/2009

CLINICAL DATA: Chest pain

EXAM:
PORTABLE CHEST - 1 VIEW

[ap]
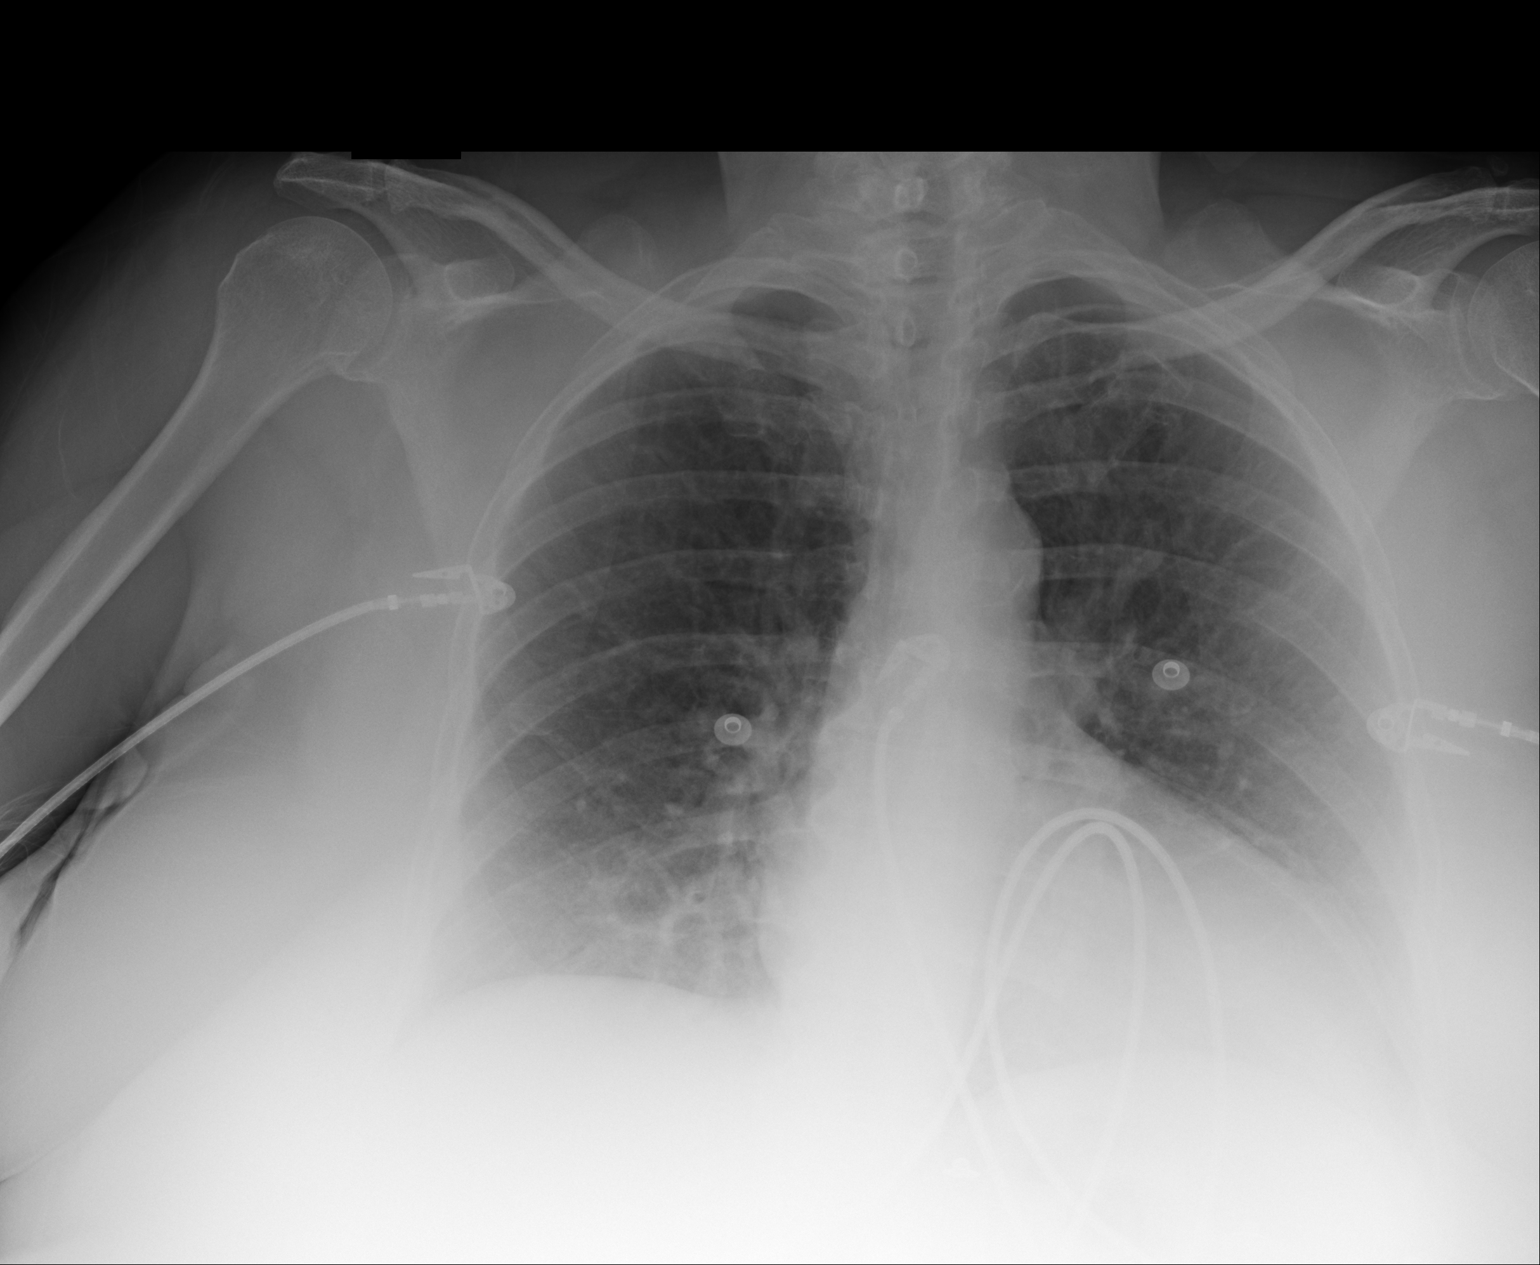

[1 of 1 positions shown; findings below may reference images not displayed]

FINDINGS: The heart size and mediastinal contours are within normal limits.
Both lungs are clear. The visualized skeletal structures are
unremarkable.
IMPRESSION: No active disease.

## 2016-01-15 ENCOUNTER — Ambulatory Visit: Payer: Self-pay | Admitting: Physician Assistant

## 2016-01-15 ENCOUNTER — Encounter: Payer: Self-pay | Admitting: Physician Assistant

## 2016-01-15 VITALS — BP 170/90 | HR 64 | Temp 98.2°F

## 2016-01-15 DIAGNOSIS — J069 Acute upper respiratory infection, unspecified: Secondary | ICD-10-CM

## 2016-01-15 LAB — POCT INFLUENZA A/B
INFLUENZA B, POC: NEGATIVE
Influenza A, POC: NEGATIVE

## 2016-01-15 MED ORDER — HYDROCOD POLST-CPM POLST ER 10-8 MG/5ML PO SUER: 5.0000 mL | Freq: Two times a day (BID) | ORAL | 0 refills | Status: DC | PRN

## 2016-01-15 NOTE — Progress Notes (Signed)
S: C/o runny nose and congestion for 4 days, no fever, chills, cp/sob, v/d; mucus is clear throughout the day, cough is sporadic, no body aches, supervisor concerned she has the flu ?if she could get tested  Using otc meds: alka seltzer +  O: PE: vitals wnl, nad, perrl eomi, normocephalic, tms dull, nasal mucosa red and swollen, throat injected, neck supple no lymph, lungs c t a, cv rrr, neuro intact, flu swab neg  A:  Acute viral uri   P: drink fluids, continue regular meds , use otc meds of choice, return if not improving in 5 days, return earlier if worsening , tussionex, 146ml nr, if worsening with chest congestion return to clinic, if mucus turns green will call in an antibiotic

## 2016-02-21 DIAGNOSIS — R1084 Generalized abdominal pain: Secondary | ICD-10-CM | POA: Diagnosis not present

## 2016-02-21 DIAGNOSIS — K5792 Diverticulitis of intestine, part unspecified, without perforation or abscess without bleeding: Secondary | ICD-10-CM | POA: Diagnosis not present

## 2016-02-29 DIAGNOSIS — E119 Type 2 diabetes mellitus without complications: Secondary | ICD-10-CM | POA: Diagnosis not present

## 2016-02-29 DIAGNOSIS — I10 Essential (primary) hypertension: Secondary | ICD-10-CM | POA: Diagnosis not present

## 2016-03-21 DIAGNOSIS — E119 Type 2 diabetes mellitus without complications: Secondary | ICD-10-CM | POA: Diagnosis not present

## 2016-04-21 ENCOUNTER — Encounter: Payer: Self-pay | Admitting: *Deleted

## 2016-04-21 ENCOUNTER — Emergency Department: Payer: 59

## 2016-04-21 ENCOUNTER — Emergency Department
Admission: EM | Admit: 2016-04-21 | Discharge: 2016-04-21 | Disposition: A | Discharge status: Home / Self Care | Payer: 59 | Attending: Emergency Medicine | Admitting: Emergency Medicine

## 2016-04-21 DIAGNOSIS — E119 Type 2 diabetes mellitus without complications: Secondary | ICD-10-CM | POA: Diagnosis not present

## 2016-04-21 DIAGNOSIS — I2510 Atherosclerotic heart disease of native coronary artery without angina pectoris: Secondary | ICD-10-CM | POA: Diagnosis not present

## 2016-04-21 DIAGNOSIS — Z79899 Other long term (current) drug therapy: Secondary | ICD-10-CM | POA: Insufficient documentation

## 2016-04-21 DIAGNOSIS — Z7984 Long term (current) use of oral hypoglycemic drugs: Secondary | ICD-10-CM | POA: Insufficient documentation

## 2016-04-21 DIAGNOSIS — R109 Unspecified abdominal pain: Secondary | ICD-10-CM | POA: Diagnosis not present

## 2016-04-21 DIAGNOSIS — I252 Old myocardial infarction: Secondary | ICD-10-CM | POA: Diagnosis not present

## 2016-04-21 DIAGNOSIS — I10 Essential (primary) hypertension: Secondary | ICD-10-CM | POA: Insufficient documentation

## 2016-04-21 DIAGNOSIS — R1032 Left lower quadrant pain: Secondary | ICD-10-CM | POA: Diagnosis not present

## 2016-04-21 DIAGNOSIS — K5732 Diverticulitis of large intestine without perforation or abscess without bleeding: Secondary | ICD-10-CM | POA: Diagnosis not present

## 2016-04-21 DIAGNOSIS — Z7982 Long term (current) use of aspirin: Secondary | ICD-10-CM | POA: Insufficient documentation

## 2016-04-21 LAB — COMPREHENSIVE METABOLIC PANEL
ALBUMIN: 3.9 g/dL (ref 3.5–5.0)
ALK PHOS: 73 U/L (ref 38–126)
ALT: 23 U/L (ref 14–54)
AST: 21 U/L (ref 15–41)
Anion gap: 6 (ref 5–15)
BILIRUBIN TOTAL: 0.5 mg/dL (ref 0.3–1.2)
BUN: 20 mg/dL (ref 6–20)
CO2: 24 mmol/L (ref 22–32)
CREATININE: 1.11 mg/dL — AB (ref 0.44–1.00)
Calcium: 9 mg/dL (ref 8.9–10.3)
Chloride: 109 mmol/L (ref 101–111)
GFR calc Af Amer: 59 mL/min — ABNORMAL LOW (ref >60)
GFR, EST NON AFRICAN AMERICAN: 51 mL/min — AB (ref >60)
Glucose, Bld: 74 mg/dL (ref 65–99)
POTASSIUM: 3.7 mmol/L (ref 3.5–5.1)
Sodium: 139 mmol/L (ref 135–145)
TOTAL PROTEIN: 7.4 g/dL (ref 6.5–8.1)

## 2016-04-21 LAB — CBC
HEMATOCRIT: 37.3 % (ref 35.0–47.0)
Hemoglobin: 12.7 g/dL (ref 12.0–16.0)
MCH: 28 pg (ref 26.0–34.0)
MCHC: 34 g/dL (ref 32.0–36.0)
MCV: 82.4 fL (ref 80.0–100.0)
Platelets: 275 10*3/uL (ref 150–440)
RBC: 4.53 MIL/uL (ref 3.80–5.20)
RDW: 16 % — ABNORMAL HIGH (ref 11.5–14.5)
WBC: 9.9 10*3/uL (ref 3.6–11.0)

## 2016-04-21 LAB — LIPASE, BLOOD: Lipase: 22 U/L (ref 11–51)

## 2016-04-21 LAB — URINALYSIS, COMPLETE (UACMP) WITH MICROSCOPIC
BILIRUBIN URINE: NEGATIVE
Bacteria, UA: NONE SEEN
Glucose, UA: NEGATIVE mg/dL
Hgb urine dipstick: NEGATIVE
KETONES UR: NEGATIVE mg/dL
LEUKOCYTES UA: NEGATIVE
Nitrite: NEGATIVE
Protein, ur: NEGATIVE mg/dL
RBC / HPF: NONE SEEN RBC/hpf (ref 0–5)
Specific Gravity, Urine: 1.014 (ref 1.005–1.030)
WBC, UA: NONE SEEN WBC/hpf (ref 0–5)
pH: 5 (ref 5.0–8.0)

## 2016-04-21 MED ORDER — IBUPROFEN 800 MG PO TABS
800.0000 mg | ORAL_TABLET | Freq: Once | ORAL | Status: AC
  Administered 2016-04-21: 800 mg via ORAL

## 2016-04-21 MED ORDER — IOPAMIDOL (ISOVUE-300) INJECTION 61%
30.0000 mL | Freq: Once | INTRAVENOUS | Status: AC | PRN
  Administered 2016-04-21: 30 mL via ORAL

## 2016-04-21 MED ORDER — IBUPROFEN 800 MG PO TABS
ORAL_TABLET | ORAL | Status: AC
  Administered 2016-04-21: 800 mg via ORAL
  Filled 2016-04-21: qty 1

## 2016-04-21 MED ORDER — CIPROFLOXACIN HCL 500 MG PO TABS: 500.0000 mg | ORAL_TABLET | Freq: Two times a day (BID) | ORAL | 0 refills | Status: DC

## 2016-04-21 MED ORDER — IOPAMIDOL (ISOVUE-300) INJECTION 61%
100.0000 mL | Freq: Once | INTRAVENOUS | Status: AC | PRN
  Administered 2016-04-21: 100 mL via INTRAVENOUS

## 2016-04-21 MED ORDER — SODIUM CHLORIDE 0.9 % IV BOLUS (SEPSIS)
500.0000 mL | Freq: Once | INTRAVENOUS | Status: AC
Start: 2016-04-21 — End: 2016-04-21
  Administered 2016-04-21: 500 mL via INTRAVENOUS

## 2016-04-21 MED ORDER — METRONIDAZOLE 500 MG PO TABS: 500.0000 mg | ORAL_TABLET | Freq: Three times a day (TID) | ORAL | 0 refills | Status: DC

## 2016-04-21 NOTE — ED Notes (Signed)
FIRST NURSE: pt ambulatory to stat desk with abd pain. NAD.

## 2016-04-21 NOTE — ED Notes (Signed)
Dr. Stafford at bedside.  

## 2016-04-21 NOTE — ED Provider Notes (Signed)
[facility] Emergency Department Provider Note  ____________________________________________  Time seen: Approximately 9:21 PM  I have reviewed the triage vital signs and the nursing notes.   HISTORY  Chief Complaint Abdominal Pain and Rectal Bleeding    HPI Yvonne Shaw is a 66 y.o. female who complains of colicky abdominal pain worsening for the past several days. This is been a recurrent issue for her associated with diverticulitis. She reports one episode of blood-tinged stool today as well. No fevers chills back pain chest pain shortness of breath or syncope. Worsen with eating. No alleviating factors. Nonradiating. Moderate intensity. Feels like colicky cramping.   Past Medical History:  Diagnosis Date  . Anginal pain ([place])   . CAD S/P percutaneous coronary angioplasty 03/19/13   s/p 2.25 mm x 15 mm (2.46) DES to mid AV Groove Circumflex  . 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 Diabetes mellitus type 2 in obese ([place])   . GERD (gastroesophageal reflux disease)   . Hyperlipidemia LDL goal <70   . Hypertension   . Nonsustained ventricular tachycardia ([place])    found on exam monitoring, asymptomatic  . Obesity   . Orthopnea   . PVC's (premature ventricular contractions)    seen on telemetry during cardiac rehabilitation  . Shortness of breath dyspnea    due to brilinta  . ST elevation myocardial infarction (STEMI) of inferolateral wall, subsequent episode of care ([place]) 03/19/13   s/p DES to mid AV Groove Circumflex; EF 50% on cath  . Stroke [facility])    2001     Patient Active Problem List   Diagnosis Date Noted  . PVC's (premature ventricular contractions) 08/26/2013  . Chest pain with moderate risk for cardiac etiology 05/28/2013  . Sinus bradycardia 04/01/2013  . Shortness of breath 03/29/2013  . HTN (hypertension) 03/20/2013  . Dyslipidemia, goal LDL below 70 03/20/2013  . Diabetes mellitus ([place]) 03/20/2013  . STEMI -  s/p DES to mid AV groove Circ 03/19/13; EF 50% on cath 03/19/2013  . ST elevation myocardial infarction (STEMI) of inferior wall ([place]) 03/19/2013  . CAD S/P percutaneous coronary angioplasty 03/19/2013     Past Surgical History:  Procedure Laterality Date  . ABDOMINAL HYSTERECTOMY    . CATARACT EXTRACTION W/PHACO Right 09/05/2014   Procedure: CATARACT EXTRACTION PHACO AND INTRAOCULAR LENS PLACEMENT (IOC);  Surgeon: Birder Robson, MD;  Location: [facility] ORS;  Service: Ophthalmology;  Laterality: Right;  [place]: 01:12.2 AP%: 20.1 CDE: 14.45 Lot # [id number]  . CHOLECYSTECTOMY    . COLONOSCOPY WITH PROPOFOL N/A 07/02/2015   Procedure: COLONOSCOPY WITH PROPOFOL;  Surgeon: Manya Silvas, MD;  Location: [facility] ENDOSCOPY;  Service: Endoscopy;  Laterality: N/A;  . LEFT HEART CATHETERIZATION WITH CORONARY ANGIOGRAM N/A 03/19/2013   Procedure: LEFT HEART CATHETERIZATION WITH CORONARY ANGIOGRAM;  Surgeon: Lorretta Harp, MD;  Location: [facility] CATH LAB;  Service: Cardiovascular;  Laterality: N/A;  . PERCUTANEOUS CORONARY STENT INTERVENTION (PCI-S)  03/19/2013   PCI to mCx - 90% lesion: 2.25 mm x 15 mm Xience Alpine DES (2.46 mm post-dilation); EF ~50%     Prior to Admission medications   Medication Sig Start Date End Date Taking? Authorizing Provider  amLODipine (NORVASC) 10 MG tablet Take 10 mg by mouth daily.    Historical Provider, MD  aspirin 81 MG tablet Take 81 mg by mouth daily.    Historical Provider, MD  atorvastatin (LIPITOR) 80 MG tablet Take 1 tablet (80 mg total) by mouth daily  at 6 PM. 08/10/13   Brittainy Erie Noe, PA-C  Blood Glucose Monitoring Suppl (ONE TOUCH ULTRA SYSTEM KIT) W/DEVICE KIT 1 kit by Does not apply route once. 03/21/13   Brittainy Erie Noe, PA-C  chlorpheniramine-HYDROcodone (TUSSIONEX PENNKINETIC ER) 10-8 MG/5ML SUER Take 5 mLs by mouth every 12 (twelve) hours as needed for cough. 01/15/16   Versie Starks, PA-C  ciprofloxacin (CIPRO) 500 MG tablet Take 1 tablet (500 mg total)  by mouth 2 (two) times daily. 04/21/16   Carrie Mew, MD  clopidogrel (PLAVIX) 75 MG tablet Take 1 tablet (75 mg total) by mouth daily. 05/24/14   Lorretta Harp, MD  hydrochlorothiazide (MICROZIDE) 12.5 MG capsule Take 1 capsule (12.5 mg total) by mouth daily. 08/10/13   Brittainy Erie Noe, PA-C  lisinopril (PRINIVIL,ZESTRIL) 20 MG tablet Take 20 mg by mouth daily.    Historical Provider, MD  metFORMIN (GLUCOPHAGE) 500 MG tablet Take 1 tablet (500 mg total) by mouth 2 (two) times daily with a meal. 03/22/13   Brittainy Erie Noe, PA-C  metoprolol tartrate (LOPRESSOR) 25 MG tablet Take 0.5 tablets (12.5 mg total) by mouth 2 (two) times daily. 08/26/13   Lorretta Harp, MD  metroNIDAZOLE (FLAGYL) 500 MG tablet Take 1 tablet (500 mg total) by mouth 3 (three) times daily. 04/21/16   Carrie Mew, MD  nitroGLYCERIN (NITROSTAT) 0.4 MG SL tablet Place 1 tablet (0.4 mg total) under the tongue every 5 (five) minutes x 3 doses as needed for chest pain. 03/21/13   Brittainy Erie Noe, PA-C  oxyCODONE-acetaminophen (ROXICET) 5-325 MG tablet Take 1-2 tablets by mouth every 6 (six) hours as needed. Patient not taking: Reported on 01/15/2016 04/30/15   Orbie Pyo, MD  Ticagrelor (BRILINTA PO) Take 90 mg by mouth 2 (two) times daily. Reported on 07/02/2015    Historical Provider, MD     Allergies Aggrenox [aspirin-dipyridamole er] and Carvedilol   Family History  Problem Relation Age of Onset  . Cancer Mother   . Diabetes Mother   . Hypertension Mother   . Hypertension Father   . Cancer Father   . Diabetes Father   . Hypertension Sister   . Diabetes Sister   . Heart failure Brother   . Diabetes Brother   . Heart attack Maternal Grandfather     Social History Social History  Substance Use Topics  . Smoking status: Never Smoker  . Smokeless tobacco: Never Used  . Alcohol use No    Review of Systems  Constitutional:   No fever or chills.  ENT:   No sore throat. No  rhinorrhea. Cardiovascular:   No chest pain. Respiratory:   No dyspnea or cough. Gastrointestinal:   Positive for abdominal pain as above. No vomiting or diarrhea. No constipation. Genitourinary:   Negative for dysuria or difficulty urinating. Musculoskeletal:   Negative for focal pain or swelling Neurological:   Negative for headaches 10-point ROS otherwise negative.  ____________________________________________   PHYSICAL EXAM:  VITAL SIGNS: ED Triage Vitals  Enc Vitals Group     BP 04/21/16 1638 (!) 189/84     Pulse Rate 04/21/16 1638 90     Resp 04/21/16 1638 20     Temp 04/21/16 1638 97.8 F (36.6 C)     Temp Source 04/21/16 1638 Oral     SpO2 04/21/16 1638 100 %     Weight 04/21/16 1640 240 lb (108.9 kg)     Height 04/21/16 1640 '5\' 7"'$  (1.702 m)  Head Circumference --      Peak Flow --      Pain Score 04/21/16 1638 5     Pain Loc --      Pain Edu? --      Excl. in [place]? --     Vital signs reviewed, nursing assessments reviewed.   Constitutional:   Alert and oriented. Well appearing and in no distress. Eyes:   No scleral icterus. No conjunctival pallor. PERRL. EOMI.  No nystagmus. ENT   Head:   Normocephalic and atraumatic.   Nose:   No congestion/rhinnorhea. No septal hematoma   Mouth/Throat:   MMM, no pharyngeal erythema. No peritonsillar mass.    Neck:   No stridor. No SubQ emphysema. No meningismus. Hematological/Lymphatic/Immunilogical:   No cervical lymphadenopathy. Cardiovascular:   RRR. Symmetric bilateral radial and DP pulses.  No murmurs.  Respiratory:   Normal respiratory effort without tachypnea nor retractions. Breath sounds are clear and equal bilaterally. No wheezes/rales/rhonchi. Gastrointestinal:   Soft With left lower quadrant tenderness. Non distended. There is no CVA tenderness.  No rebound, rigidity, or guarding. Genitourinary:   deferred Musculoskeletal:   Normal range of motion in all extremities. No joint effusions.  No lower  extremity tenderness.  No edema. Neurologic:   Normal speech and language.  CN 2-10 normal. Motor grossly intact. No gross focal neurologic deficits are appreciated.  Skin:    Skin is warm, dry and intact. No rash noted.  No petechiae, purpura, or bullae.  ____________________________________________    LABS (pertinent positives/negatives) (all labs ordered are listed, but only abnormal results are displayed) Labs Reviewed  COMPREHENSIVE METABOLIC PANEL - Abnormal; Notable for the following:       Result Value   Creatinine, Ser 1.11 (*)    GFR calc non Af Amer 51 (*)    GFR calc Af Amer 59 (*)    All other components within normal limits  CBC - Abnormal; Notable for the following:    RDW 16.0 (*)    All other components within normal limits  URINALYSIS, COMPLETE (UACMP) WITH MICROSCOPIC - Abnormal; Notable for the following:    Color, Urine YELLOW (*)    APPearance CLEAR (*)    Squamous Epithelial / LPF 0-5 (*)    All other components within normal limits  LIPASE, BLOOD   ____________________________________________   EKG    ____________________________________________    RADIOLOGY  Ct Abdomen Pelvis W Contrast  Result Date: 04/21/2016 CLINICAL DATA:  66 year old female with history of diverticulitis 6 months ago treated with antibiotics, presenting with recent episodes of lower abdominal pain intermittently over 1 month, worse after eating. Some in hematochezia. EXAM: CT ABDOMEN AND PELVIS WITH CONTRAST TECHNIQUE: Multidetector CT imaging of the abdomen and pelvis was performed using the standard protocol following bolus administration of intravenous contrast. CONTRAST:  194m ISOVUE-300 IOPAMIDOL (ISOVUE-300) INJECTION 61% COMPARISON:  CT the abdomen and pelvis 04/30/2015. FINDINGS: Lower chest: Unremarkable. Hepatobiliary: 2.2 cm low-attenuation lesion in segment 4B of the liver is compatible with a simple cyst. No suspicious hepatic lesions. No intra or extrahepatic  biliary ductal dilatation. Status post cholecystectomy. Pancreas: No pancreatic mass. No pancreatic ductal dilatation. No pancreatic or peripancreatic fluid or inflammatory changes. Spleen: Unremarkable. Adrenals/Urinary Tract: 2 cm simple cyst in the upper pole the left kidney. Multiple additional subcentimeter low-attenuation lesions in both kidneys (right greater than left) are too small to definitively characterize, but are favored to represent cysts. No hydroureteronephrosis. Urinary bladder is normal in appearance. Bilateral adrenal glands  are unremarkable in appearance. Stomach/Bowel: Normal appearance of the stomach. No pathologic dilatation of small bowel or colon. Numerous colonic diverticulae are noted. Surrounding portions of the sigmoid colon there is extensive inflammatory changes. No discrete diverticular abscess is noted at this time. No signs of frank perforation. There is a relatively long segment of colonic wall thickening throughout the sigmoid colon, best appreciated on axial image 67 of series 8, which is more substantial than seen on the prior study from 04/30/2015. The appendix is not confidently identified and may be surgically absent. Regardless, there are no inflammatory changes noted adjacent to the cecum to suggest the presence of an acute appendicitis at this time. Vascular/Lymphatic: Aortic atherosclerosis, without evidence of aneurysm or dissection in the abdominal or pelvic vasculature. No lymphadenopathy noted in the abdomen or pelvis. Reproductive: Status post hysterectomy. Ovaries are not confidently identified may be surgically absent or atrophic. Other: Small umbilical hernia containing only omental fat incidentally noted. No significant volume of ascites. No pneumoperitoneum. Musculoskeletal: There are no aggressive appearing lytic or blastic lesions noted in the visualized portions of the skeleton. IMPRESSION: 1. Findings are compatible with an acute sigmoid diverticulitis, as  above. No diverticular abscess or signs of frank perforation are noted at this time. 2. Persistent and increasing colonic wall thickening somewhat mass-like in appearance in the sigmoid colon. While this may simply be a reflection of chronic inflammation from underlying diverticular disease, an underlying colonic neoplasm is not excluded. If there has been no recent colonoscopy for evaluation, nonemergent colonoscopy should be performed in the near future to exclude neoplasm. 3. Aortic atherosclerosis. 4. Additional incidental findings, as above. Electronically Signed   By: Vinnie Langton M.D.   On: 04/21/2016 20:19    ____________________________________________   PROCEDURES Procedures  ____________________________________________   INITIAL IMPRESSION / ASSESSMENT AND PLAN / ED COURSE  Pertinent labs & imaging results that were available during my care of the patient were reviewed by me and considered in my medical decision making (see chart for details).       Clinical Course as of Apr 22 2119  Mon Apr 21, 2016  1902 P/w llq pain, tender. Wiill get CT a/p to eval for possibly complicated diverticulitis  [PS]    Clinical Course User Index [PS] Carrie Mew, MD     ----------------------------------------- 9:24 PM on 04/21/2016 -----------------------------------------  Vital signs are stable. CT positive for sigmoid diverticulitis without complication. Pain is controlled. Tolerating oral intake. We'll start on Cipro Flagyl. Patient will do Colace and increase water and fiber content of diet. Declines pain medication and states she'll just take Tylenol. Follow-up with primary care. Counseled on possible following up with surgery. Low suspicion for occult vascular pathology, perforation or abscess bowel obstruction or other acute surgical issue.  ____________________________________________   FINAL CLINICAL IMPRESSION(S) / ED DIAGNOSES  Final diagnoses:  Sigmoid  diverticulitis      New Prescriptions   CIPROFLOXACIN (CIPRO) 500 MG TABLET    Take 1 tablet (500 mg total) by mouth 2 (two) times daily.   METRONIDAZOLE (FLAGYL) 500 MG TABLET    Take 1 tablet (500 mg total) by mouth 3 (three) times daily.     Portions of this note were generated with dragon dictation software. Dictation errors may occur despite best attempts at proofreading.    Carrie Mew, MD 04/21/16 2125

## 2016-04-21 NOTE — ED Triage Notes (Signed)
Pt ambulatory to triage.  Pt has low abd pain.  Hx diverticulitis.  Pt states she had several BM's today and saw blood in stools.  Pt alert. Speech clear.

## 2016-04-21 NOTE — ED Notes (Signed)
Pt states was dx with diverticulitis about 6 months ago, received antibiotics and sent home. Pt states since then has had 2 episodes of it again. States lower abd pain intermittently x 1 month, worse after eating, also states worse after drinking coffee. States today has had multiple formed BM's, last one having blood in it. Denies hemorrhoids. Denies N&V&fever. Alert, oriented, ambulatory. No distress noted at current.

## 2016-04-21 NOTE — ED Notes (Signed)
Pt is finished drinking contrast, CT notified.

## 2016-04-21 NOTE — ED Notes (Signed)
Pt ambulatory to toilet and back by self.  

## 2016-05-28 DIAGNOSIS — E785 Hyperlipidemia, unspecified: Secondary | ICD-10-CM | POA: Diagnosis not present

## 2016-05-28 DIAGNOSIS — I10 Essential (primary) hypertension: Secondary | ICD-10-CM | POA: Diagnosis not present

## 2016-05-28 DIAGNOSIS — N183 Chronic kidney disease, stage 3 (moderate): Secondary | ICD-10-CM | POA: Diagnosis not present

## 2016-05-28 DIAGNOSIS — E119 Type 2 diabetes mellitus without complications: Secondary | ICD-10-CM | POA: Diagnosis not present

## 2016-07-08 DIAGNOSIS — H900 Conductive hearing loss, bilateral: Secondary | ICD-10-CM | POA: Diagnosis not present

## 2016-07-08 DIAGNOSIS — H6123 Impacted cerumen, bilateral: Secondary | ICD-10-CM | POA: Diagnosis not present

## 2016-08-06 ENCOUNTER — Ambulatory Visit (INDEPENDENT_AMBULATORY_CARE_PROVIDER_SITE_OTHER): Payer: 59 | Admitting: Cardiovascular Disease

## 2016-08-06 ENCOUNTER — Encounter: Payer: Self-pay | Admitting: Cardiovascular Disease

## 2016-08-06 VITALS — BP 178/80 | HR 65 | Ht 67.0 in | Wt 245.0 lb

## 2016-08-06 DIAGNOSIS — I10 Essential (primary) hypertension: Secondary | ICD-10-CM | POA: Diagnosis not present

## 2016-08-06 DIAGNOSIS — Z9861 Coronary angioplasty status: Secondary | ICD-10-CM | POA: Diagnosis not present

## 2016-08-06 DIAGNOSIS — I2510 Atherosclerotic heart disease of native coronary artery without angina pectoris: Secondary | ICD-10-CM

## 2016-08-06 DIAGNOSIS — E785 Hyperlipidemia, unspecified: Secondary | ICD-10-CM | POA: Diagnosis not present

## 2016-08-06 NOTE — Patient Instructions (Signed)

## 2016-08-06 NOTE — Progress Notes (Signed)
08/06/2016 Yvonne Shaw   Aug 20, 1950  [id number]  Primary Physician Dema Severin Orlene Och, NP Primary Cardiologist: Lorretta Harp MD Lupe Carney, [place]  HPI:  Yvonne Shaw is a 66 y.o. female  Married [place] American female mother of 2, grandmother and 6 grandchildren he works as a Marine scientist at [employer]. I last saw her in the office 06/12/15.Marland KitchenShe had no prior cardiac history, PMHx s/f h/o CVA, HTN, HLD and obesity who was admitted to [facility] on 03/19/13 with an inferior STEMI. She reports being in her USOH until ~ 9PM last night when she began experiencing intermittent substernal chest burning w/o radiation or associated symptoms. She was not initially concerned and attributed this to indigestion. She went to sleep and was awoken at 6AM by the same discomfort which had increased to a 9/10 in intensity and with associated nausea. She got up and tried to busy herself around the house, however the discomfort persisted. She alerted her husband who called 911. Serial EKG tracings in the field reveal fluctuating ST segments (22m elevation to isoelectric) in leads III, aVF. [facility]. She did receive ASA x 4 and heparin bolus. [facility]. 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 She was just been discharged 2 days later. Her only problems have been episodic shortness of breath probably related to the [place] did have an episode of chest pain in May [facility]. While performing cardiac rehabilitation care it was noted that she had frequent PVCs on telemetry although she was unaware of this.patient wore a 30 day event monitor which did show some short runs of nonsustained ventricular tachycardia. Since I saw her one year ago she has  remained stable. Her Brilenta was changed to Plavix because of shortness of breath which has since resolved. She is otherwise asymptomatic   Current Meds  Medication Sig  . amLODipine (NORVASC) 10 MG tablet Take 10 mg by mouth daily.  .Marland Kitchenaspirin 81 MG tablet Take 81 mg by mouth daily.  .Marland Kitchenatorvastatin (LIPITOR) 80 MG tablet Take 1 tablet (80 mg total) by mouth daily at 6 PM.  . Blood Glucose Monitoring Suppl (ONE TOUCH ULTRA SYSTEM KIT) W/DEVICE KIT 1 kit by Does not apply route once.  . clopidogrel (PLAVIX) 75 MG tablet Take 1 tablet (75 mg total) by mouth daily.  .Marland KitchenGLIPIZIDE XL 5 MG 24 hr tablet Take 1 tablet by mouth daily.  . hydrochlorothiazide (MICROZIDE) 12.5 MG capsule Take 1 capsule (12.5 mg total) by mouth daily.  .Marland Kitchenlisinopril (PRINIVIL,ZESTRIL) 20 MG tablet Take 20 mg by mouth daily.  . metoprolol tartrate (LOPRESSOR) 25 MG tablet Take 0.5 tablets (12.5 mg total) by mouth 2 (two) times daily.  . nitroGLYCERIN (NITROSTAT) 0.4 MG SL tablet Place 1 tablet (0.4 mg total) under the tongue every 5 (five) minutes x 3 doses as needed for chest pain.     Allergies  Allergen Reactions  . Aggrenox [Aspirin-Dipyridamole Er] Other (See Comments), Nausea Only and Nausea And Vomiting    Severe headache  . Carvedilol Other (See Comments)    caused fatigue    Social History   Social History  . Marital status: Married    Spouse name: N/A  . Number of children: N/A  . Years of education: N/A  Occupational History  . Not on file.   Social History Main Topics  . Smoking status: Never Smoker  . Smokeless tobacco: Never Used  . Alcohol use No  . Drug use: No  . Sexual activity: Not on file   Other Topics Concern  . Not on file   Social History Narrative   Married, 2 healthy children. Lives in [place], [place]. Works at [employer].     Review of Systems: General: negative for chills, fever, night sweats or weight changes.  Cardiovascular: negative for chest pain, dyspnea on exertion, edema,  orthopnea, palpitations, paroxysmal nocturnal dyspnea or shortness of breath Dermatological: negative for rash Respiratory: negative for cough or wheezing Urologic: negative for hematuria Abdominal: negative for nausea, vomiting, diarrhea, bright red blood per rectum, melena, or hematemesis Neurologic: negative for visual changes, syncope, or dizziness All other systems reviewed and are otherwise negative except as noted above.    Blood pressure (!) 178/80, pulse 65, height _0  (1.702 m), weight 245 lb (111.1 kg).  General appearance: alert and no distress Neck: no adenopathy, no carotid bruit, no JVD, supple, symmetrical, trachea midline and thyroid not enlarged, symmetric, no tenderness/mass/nodules Lungs: clear to auscultation bilaterally Heart: regular rate and rhythm, S1, S2 normal, no murmur, click, rub or gallop Extremities: extremities normal, atraumatic, no cyanosis or edema  EKG sinus rhythm at 65 with nonspecific ST and T-wave changes. I personally reviewed this EKG.  ASSESSMENT AND PLAN:   HTN (hypertension) History of essential hypertension blood pressure measured at 178/80. She says he checks checks her blood pressure at work and runs systolics between 09/81/1914. She is on amlodipine, hydrochlorothiazide, lisinopril and metoprolol. Continue current meds at current dosing  Dyslipidemia, goal LDL below 70 History of dyslipidemia on statin therapy followed by her PCP  CAD S/P percutaneous coronary angioplasty History of CAD status post inferior STEMI 03/19/13 treated with PCI dribbling stenting of the AV groove circumflex which I stented with a 2.25 mm x 15 mm long drug-eluting stent. Her EF was normal by 2-D echo. She's had no recurrent symptoms.      Lorretta Harp MD FACP,FACC,FAHA, [facility] 08/06/2016 10:48 AM

## 2016-08-06 NOTE — Assessment & Plan Note (Signed)
History of dyslipidemia on statin therapy followed by her PCP 

## 2016-08-06 NOTE — Assessment & Plan Note (Signed)
History of essential hypertension blood pressure measured at 178/80. She says he checks checks her blood pressure at work and runs systolics between 22/41/1464. She is on amlodipine, hydrochlorothiazide, lisinopril and metoprolol. Continue current meds at current dosing

## 2016-08-06 NOTE — Assessment & Plan Note (Signed)
History of CAD status post inferior STEMI 03/19/13 treated with PCI dribbling stenting of the AV groove circumflex which I stented with a 2.25 mm x 15 mm long drug-eluting stent. Her EF was normal by 2-D echo. She's had no recurrent symptoms.

## 2016-09-09 DIAGNOSIS — I10 Essential (primary) hypertension: Secondary | ICD-10-CM | POA: Diagnosis not present

## 2016-09-09 DIAGNOSIS — Z1231 Encounter for screening mammogram for malignant neoplasm of breast: Secondary | ICD-10-CM | POA: Diagnosis not present

## 2016-09-09 DIAGNOSIS — E119 Type 2 diabetes mellitus without complications: Secondary | ICD-10-CM | POA: Diagnosis not present

## 2016-09-09 DIAGNOSIS — Z23 Encounter for immunization: Secondary | ICD-10-CM | POA: Diagnosis not present

## 2016-11-10 ENCOUNTER — Other Ambulatory Visit: Payer: Self-pay | Admitting: Family Medicine

## 2016-11-10 DIAGNOSIS — Z1231 Encounter for screening mammogram for malignant neoplasm of breast: Secondary | ICD-10-CM

## 2016-12-04 ENCOUNTER — Ambulatory Visit
Admission: RE | Admit: 2016-12-04 | Discharge: 2016-12-04 | Disposition: A | Discharge status: Home / Self Care | Payer: 59 | Source: Ambulatory Visit | Attending: Family Medicine | Admitting: Family Medicine

## 2016-12-04 DIAGNOSIS — Z1231 Encounter for screening mammogram for malignant neoplasm of breast: Secondary | ICD-10-CM | POA: Insufficient documentation

## 2016-12-08 ENCOUNTER — Other Ambulatory Visit: Payer: Self-pay | Admitting: Family Medicine

## 2016-12-08 DIAGNOSIS — Z01419 Encounter for gynecological examination (general) (routine) without abnormal findings: Secondary | ICD-10-CM | POA: Diagnosis not present

## 2016-12-08 DIAGNOSIS — Z124 Encounter for screening for malignant neoplasm of cervix: Secondary | ICD-10-CM | POA: Diagnosis not present

## 2016-12-08 DIAGNOSIS — E2839 Other primary ovarian failure: Secondary | ICD-10-CM | POA: Diagnosis not present

## 2016-12-08 DIAGNOSIS — M79671 Pain in right foot: Secondary | ICD-10-CM | POA: Diagnosis not present

## 2016-12-08 DIAGNOSIS — Z23 Encounter for immunization: Secondary | ICD-10-CM | POA: Diagnosis not present

## 2017-03-06 ENCOUNTER — Other Ambulatory Visit: Payer: Self-pay

## 2017-03-06 ENCOUNTER — Encounter: Payer: Self-pay | Admitting: Emergency Medicine

## 2017-03-06 ENCOUNTER — Emergency Department
Admission: EM | Admit: 2017-03-06 | Discharge: 2017-03-06 | Disposition: A | Discharge status: Home / Self Care | Payer: Medicare Other | Attending: Emergency Medicine | Admitting: Emergency Medicine

## 2017-03-06 DIAGNOSIS — M316 Other giant cell arteritis: Secondary | ICD-10-CM | POA: Diagnosis not present

## 2017-03-06 DIAGNOSIS — Z79899 Other long term (current) drug therapy: Secondary | ICD-10-CM | POA: Diagnosis not present

## 2017-03-06 DIAGNOSIS — Z7984 Long term (current) use of oral hypoglycemic drugs: Secondary | ICD-10-CM | POA: Insufficient documentation

## 2017-03-06 DIAGNOSIS — R519 Headache, unspecified: Secondary | ICD-10-CM

## 2017-03-06 DIAGNOSIS — R51 Headache: Secondary | ICD-10-CM | POA: Diagnosis not present

## 2017-03-06 DIAGNOSIS — Z8673 Personal history of transient ischemic attack (TIA), and cerebral infarction without residual deficits: Secondary | ICD-10-CM | POA: Diagnosis not present

## 2017-03-06 DIAGNOSIS — I2510 Atherosclerotic heart disease of native coronary artery without angina pectoris: Secondary | ICD-10-CM | POA: Insufficient documentation

## 2017-03-06 DIAGNOSIS — Z7902 Long term (current) use of antithrombotics/antiplatelets: Secondary | ICD-10-CM | POA: Diagnosis not present

## 2017-03-06 DIAGNOSIS — R6884 Jaw pain: Secondary | ICD-10-CM | POA: Diagnosis present

## 2017-03-06 DIAGNOSIS — I10 Essential (primary) hypertension: Secondary | ICD-10-CM | POA: Insufficient documentation

## 2017-03-06 DIAGNOSIS — E119 Type 2 diabetes mellitus without complications: Secondary | ICD-10-CM | POA: Insufficient documentation

## 2017-03-06 DIAGNOSIS — I252 Old myocardial infarction: Secondary | ICD-10-CM | POA: Insufficient documentation

## 2017-03-06 DIAGNOSIS — E785 Hyperlipidemia, unspecified: Secondary | ICD-10-CM | POA: Insufficient documentation

## 2017-03-06 DIAGNOSIS — Z7982 Long term (current) use of aspirin: Secondary | ICD-10-CM | POA: Diagnosis not present

## 2017-03-06 LAB — CBC
HEMATOCRIT: 39.9 % (ref 35.0–47.0)
HEMOGLOBIN: 13.3 g/dL (ref 12.0–16.0)
MCH: 28.1 pg (ref 26.0–34.0)
MCHC: 33.3 g/dL (ref 32.0–36.0)
MCV: 84.3 fL (ref 80.0–100.0)
Platelets: 224 10*3/uL (ref 150–440)
RBC: 4.73 MIL/uL (ref 3.80–5.20)
RDW: 15 % — AB (ref 11.5–14.5)
WBC: 11.1 10*3/uL — AB (ref 3.6–11.0)

## 2017-03-06 LAB — BASIC METABOLIC PANEL
ANION GAP: 7 (ref 5–15)
BUN: 21 mg/dL — ABNORMAL HIGH (ref 6–20)
CO2: 23 mmol/L (ref 22–32)
Calcium: 8.9 mg/dL (ref 8.9–10.3)
Chloride: 110 mmol/L (ref 101–111)
Creatinine, Ser: 1.18 mg/dL — ABNORMAL HIGH (ref 0.44–1.00)
GFR calc non Af Amer: 47 mL/min — ABNORMAL LOW (ref >60)
GFR, EST AFRICAN AMERICAN: 55 mL/min — AB (ref >60)
Glucose, Bld: 121 mg/dL — ABNORMAL HIGH (ref 65–99)
POTASSIUM: 3.9 mmol/L (ref 3.5–5.1)
Sodium: 140 mmol/L (ref 135–145)

## 2017-03-06 LAB — TROPONIN I: Troponin I: <0.03 ng/mL (ref <0.03)

## 2017-03-06 LAB — SEDIMENTATION RATE: SED RATE: 14 mm/h (ref 0–30)

## 2017-03-06 MED ORDER — METOPROLOL TARTRATE 25 MG PO TABS: 12.5000 mg | ORAL_TABLET | Freq: Once | ORAL | Status: DC

## 2017-03-06 MED ORDER — ACETAMINOPHEN 500 MG PO TABS
1000.0000 mg | ORAL_TABLET | Freq: Once | ORAL | Status: AC
  Administered 2017-03-06: 1000 mg via ORAL
  Filled 2017-03-06: qty 2

## 2017-03-06 MED ORDER — PREDNISONE 10 MG PO TABS: ORAL_TABLET | ORAL | 0 refills | Status: DC

## 2017-03-06 NOTE — ED Triage Notes (Signed)
Patient ambulatory to triage with steady gait, without difficulty or distress noted; pt reports 2 episodes of left sided neck/jaw pain since midnight accomp by nausea

## 2017-03-06 NOTE — ED Notes (Signed)
AAOx3.  Skin warm and dry.  NAD 

## 2017-03-06 NOTE — ED Provider Notes (Signed)
[facility] Emergency Department Provider Note  ___________________________________________   First MD Initiated Contact with Patient 03/06/17 [phone]     (approximate)  I have reviewed the triage vital signs and the nursing notes.   HISTORY  Chief Complaint Jaw Pain   HPI Yvonne Shaw is a 67 y.o. female with a history of CAD, diabetes as well as hypertension was presenting to the emergency department with 24 hours of left-sided headache as well as jaw pain.  The patient says that she has also had nausea.  She says that she took Tylenol yesterday and the headache is gone away but now it is back.  Denies any sudden onset or worst headache of her life.  Says it is an 8 out of 10 aching pain.  Denies any vision change.  Says that the headache is also associated with intermittent jaw pain.  Says that she was having jaw pain last night the last about 20 minutes.  However, she says at this time when she moves her jaw that it still hurts on the left side.  Says that she has a history of a STEMI and presented with vomiting as well as an indigestion type burning in her chest which she has not had over the past 24 hours.  She says that she is compliant with her Plavix as well as aspirin.  Past Medical History:  Diagnosis Date  . Anginal pain ([place])   . CAD S/P percutaneous coronary angioplasty 03/19/13   s/p 2.25 mm x 15 mm (2.46) DES to mid AV Groove Circumflex  . 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 Diabetes mellitus type 2 in obese ([place])   . GERD (gastroesophageal reflux disease)   . Hyperlipidemia LDL goal <70   . Hypertension   . Nonsustained ventricular tachycardia ([place])    found on exam monitoring, asymptomatic  . Obesity   . Orthopnea   . PVC's (premature ventricular contractions)    seen on telemetry during cardiac rehabilitation  . Shortness of breath dyspnea    due to brilinta  . ST elevation myocardial infarction (STEMI) of  inferolateral wall, subsequent episode of care ([place]) 03/19/13   s/p DES to mid AV Groove Circumflex; EF 50% on cath  . Stroke [facility])    2001    Patient Active Problem List   Diagnosis Date Noted  . PVC's (premature ventricular contractions) 08/26/2013  . Chest pain with moderate risk for cardiac etiology 05/28/2013  . Sinus bradycardia 04/01/2013  . Shortness of breath 03/29/2013  . HTN (hypertension) 03/20/2013  . Dyslipidemia, goal LDL below 70 03/20/2013  . Diabetes mellitus ([place]) 03/20/2013  . STEMI - s/p DES to mid AV groove Circ 03/19/13; EF 50% on cath 03/19/2013  . ST elevation myocardial infarction (STEMI) of inferior wall ([place]) 03/19/2013  . CAD S/P percutaneous coronary angioplasty 03/19/2013    Past Surgical History:  Procedure Laterality Date  . ABDOMINAL HYSTERECTOMY    . CATARACT EXTRACTION W/PHACO Right 09/05/2014   Procedure: CATARACT EXTRACTION PHACO AND INTRAOCULAR LENS PLACEMENT (IOC);  Surgeon: Birder Robson, MD;  Location: [facility] ORS;  Service: Ophthalmology;  Laterality: Right;  [place]: 01:12.2 AP%: 20.1 CDE: 14.45 Lot # [id number]  . CHOLECYSTECTOMY    . COLONOSCOPY WITH PROPOFOL N/A 07/02/2015   Procedure: COLONOSCOPY WITH PROPOFOL;  Surgeon: Manya Silvas, MD;  Location[facility] ENDOSCOPY;  Service: Endoscopy;  Laterality: N/A;  . LEFT HEART CATHETERIZATION WITH CORONARY ANGIOGRAM N/A 03/19/2013  Procedure: LEFT HEART CATHETERIZATION WITH CORONARY ANGIOGRAM;  Surgeon: Lorretta Harp, MD;  Location: [facility] CATH LAB;  Service: Cardiovascular;  Laterality: N/A;  . PERCUTANEOUS CORONARY STENT INTERVENTION (PCI-S)  03/19/2013   PCI to mCx - 90% lesion: 2.25 mm x 15 mm Xience Alpine DES (2.46 mm post-dilation); EF ~50%    Prior to Admission medications   Medication Sig Start Date End Date Taking? Authorizing Provider  aspirin 81 MG tablet Take 81 mg by mouth daily.   Yes [provider]  atorvastatin (LIPITOR) 80 MG tablet Take 1 tablet (80 mg total) by mouth  daily at 6 PM. 08/10/13  Yes Rosita Fire, Brittainy M, PA-C  Blood Glucose Monitoring Suppl (ONE TOUCH ULTRA SYSTEM KIT) W/DEVICE KIT 1 kit by Does not apply route once. 03/21/13  Yes Simmons, Brittainy M, PA-C  Cinnamon 500 MG TABS Take 1-2 tablets by mouth daily.   Yes [provider]  clopidogrel (PLAVIX) 75 MG tablet Take 1 tablet (75 mg total) by mouth daily. 05/24/14  Yes Lorretta Harp, MD  GLIPIZIDE XL 5 MG 24 hr tablet Take 1 tablet by mouth daily. 05/28/16  Yes [provider]  hydrochlorothiazide (MICROZIDE) 12.5 MG capsule Take 1 capsule (12.5 mg total) by mouth daily. 08/10/13  Yes Rosita Fire, Brittainy M, PA-C  lisinopril (PRINIVIL,ZESTRIL) 20 MG tablet Take 20 mg by mouth daily.   Yes [provider]  metoprolol tartrate (LOPRESSOR) 25 MG tablet Take 0.5 tablets (12.5 mg total) by mouth 2 (two) times daily. 08/26/13  Yes Lorretta Harp, MD  nitroGLYCERIN (NITROSTAT) 0.4 MG SL tablet Place 1 tablet (0.4 mg total) under the tongue every 5 (five) minutes x 3 doses as needed for chest pain. 03/21/13  Yes Lyda Jester M, PA-C    Allergies Aggrenox [aspirin-dipyridamole er] and Carvedilol  Family History  Problem Relation Age of Onset  . Cancer Mother   . Diabetes Mother   . Hypertension Mother   . Hypertension Father   . Cancer Father   . Diabetes Father   . Hypertension Sister   . Diabetes Sister   . Heart failure Brother   . Diabetes Brother   . Heart attack Maternal Grandfather     Social History Social History   Tobacco Use  . Smoking status: Never Smoker  . Smokeless tobacco: Never Used  Substance Use Topics  . Alcohol use: No  . Drug use: No    Review of Systems  Constitutional: No fever/chills Eyes: No visual changes. ENT: No sore throat. Cardiovascular: Denies chest pain. Respiratory: Denies shortness of breath. Gastrointestinal: No abdominal pain.  no vomiting.  No diarrhea.  No constipation. Genitourinary: Negative for  dysuria. Musculoskeletal: Negative for back pain. Skin: Negative for rash. Neurological: Negative for focal weakness or numbness.   ____________________________________________   PHYSICAL EXAM:  VITAL SIGNS: ED Triage Vitals  Enc Vitals Group     BP 03/06/17 0243 (!) 188/77     Pulse Rate 03/06/17 0243 61     Resp 03/06/17 0243 20     Temp 03/06/17 0545 (!) 97.5 F (36.4 C)     Temp Source 03/06/17 0545 Oral     SpO2 03/06/17 0243 98 %     Weight 03/06/17 0244 249 lb (112.9 kg)     Height 03/06/17 0244 '5\' 6"'$  (1.676 m)     Head Circumference --      Peak Flow --      Pain Score --      Pain  Loc --      Pain Edu? --      Excl. in [place]? --     Constitutional: Alert and oriented. Well appearing and in no acute distress. Eyes: Conjunctivae are normal.  Head: Atraumatic.  No tenderness or nodularity or along the distribution of the left temporal artery. Nose: No congestion/rhinnorhea. Mouth/Throat: Mucous membranes are moist.  Neck: No stridor.   Cardiovascular: Normal rate, regular rhythm. Grossly normal heart sounds.  Respiratory: Normal respiratory effort.  No retractions. Lungs CTAB. Gastrointestinal: Soft and nontender. No distention.  Musculoskeletal: No lower extremity tenderness nor edema.  No joint effusions. Neurologic:  Normal speech and language. No gross focal neurologic deficits are appreciated. Skin:  Skin is warm, dry and intact. No rash noted. Psychiatric: Mood and affect are normal. Speech and behavior are normal.  ____________________________________________   LABS (all labs ordered are listed, but only abnormal results are displayed)  Labs Reviewed  CBC - Abnormal; Notable for the following components:      Result Value   WBC 11.1 (*)    RDW 15.0 (*)    All other components within normal limits  BASIC METABOLIC PANEL - Abnormal; Notable for the following components:   Glucose, Bld 121 (*)    BUN 21 (*)    Creatinine, Ser 1.18 (*)    GFR calc non  Af Amer 47 (*)    GFR calc Af Amer 55 (*)    All other components within normal limits  TROPONIN I  TROPONIN I  SEDIMENTATION RATE   ____________________________________________  EKG  ED ECG REPORT I, Doran Stabler, the attending physician, personally viewed and interpreted this ECG.   Date: 03/06/2017  EKG Time: 0248  Rate: 46  Rhythm: sinus bradycardia  Axis: Normal  Intervals:none  ST&T Change: No ST segment elevation or depression.  No abnormal T wave inversion.  ____________________________________________  RADIOLOGY   ____________________________________________   PROCEDURES  Procedure(s) performed:   Procedures  Critical Care performed:   ____________________________________________   INITIAL IMPRESSION / ASSESSMENT AND PLAN / ED COURSE  Pertinent labs & imaging results that were available during my care of the patient were reviewed by me and considered in my medical decision making (see chart for details).  Differential diagnosis includes, but is not limited to, intracranial hemorrhage, meningitis/encephalitis, previous head trauma, cavernous venous thrombosis, tension headache, temporal arteritis, migraine or migraine equivalent, idiopathic intracranial hypertension, and non-specific headache. Differential diagnosis includes, but is not limited to, ACS, aortic dissection, pulmonary embolism, cardiac tamponade, pneumothorax, pneumonia, pericarditis, myocarditis, GI-related causes including esophagitis/gastritis, and musculoskeletal chest wall pain.   As part of my medical decision making, I reviewed the following data within the electronic MEDICAL RECORD NUMBER Notes from prior ED visits  ----------------------------------------- 9:44 AM on 03/06/2017 -----------------------------------------  Patient at this time is pain-free after Tylenol.  Normal sedimentation rate and second troponin.  However symptoms consistent with giant cell arteritis.  Patient  says that she is taking prednisone before without issue and does check her blood sugars at home.  She will be discharged with a 2-week taper of prednisone and will follow up with her primary care doctor. ____________________________________________   FINAL CLINICAL IMPRESSION(S) / ED DIAGNOSES  Headache.  Giant cell arteritis    NEW MEDICATIONS STARTED DURING THIS VISIT:  New Prescriptions   No medications on file     Note:  This document was prepared using Dragon voice recognition software and may include unintentional dictation errors.     [place],  Randall An, MD 03/06/17 [phone]

## 2017-03-17 ENCOUNTER — Other Ambulatory Visit: Payer: Self-pay | Admitting: Neurology

## 2017-03-17 DIAGNOSIS — G459 Transient cerebral ischemic attack, unspecified: Secondary | ICD-10-CM

## 2017-03-25 ENCOUNTER — Ambulatory Visit
Admission: RE | Admit: 2017-03-25 | Discharge: 2017-03-25 | Disposition: A | Discharge status: Home / Self Care | Payer: Medicare Other | Source: Ambulatory Visit | Attending: Neurology | Admitting: Neurology

## 2017-03-25 DIAGNOSIS — I6782 Cerebral ischemia: Secondary | ICD-10-CM | POA: Diagnosis not present

## 2017-03-25 DIAGNOSIS — Z8673 Personal history of transient ischemic attack (TIA), and cerebral infarction without residual deficits: Secondary | ICD-10-CM | POA: Insufficient documentation

## 2017-03-25 DIAGNOSIS — G459 Transient cerebral ischemic attack, unspecified: Secondary | ICD-10-CM

## 2018-01-21 ENCOUNTER — Other Ambulatory Visit: Payer: Self-pay | Admitting: Family Medicine

## 2018-01-21 DIAGNOSIS — Z1231 Encounter for screening mammogram for malignant neoplasm of breast: Secondary | ICD-10-CM

## 2018-02-22 ENCOUNTER — Ambulatory Visit
Admission: RE | Admit: 2018-02-22 | Discharge: 2018-02-22 | Disposition: A | Discharge status: Home / Self Care | Payer: Medicare Other | Source: Ambulatory Visit | Attending: Family Medicine | Admitting: Family Medicine

## 2018-02-22 DIAGNOSIS — Z1231 Encounter for screening mammogram for malignant neoplasm of breast: Secondary | ICD-10-CM | POA: Diagnosis not present

## 2018-09-22 ENCOUNTER — Other Ambulatory Visit: Payer: Self-pay | Admitting: Family Medicine

## 2018-09-22 DIAGNOSIS — E2839 Other primary ovarian failure: Secondary | ICD-10-CM

## 2019-05-18 ENCOUNTER — Other Ambulatory Visit: Payer: Self-pay

## 2019-05-18 ENCOUNTER — Encounter: Payer: Self-pay | Admitting: Cardiovascular Disease

## 2019-05-18 ENCOUNTER — Ambulatory Visit: Payer: Medicare HMO | Admitting: Cardiovascular Disease

## 2019-05-18 DIAGNOSIS — I10 Essential (primary) hypertension: Secondary | ICD-10-CM

## 2019-05-18 DIAGNOSIS — E785 Hyperlipidemia, unspecified: Secondary | ICD-10-CM | POA: Diagnosis not present

## 2019-05-18 DIAGNOSIS — I2119 ST elevation (STEMI) myocardial infarction involving other coronary artery of inferior wall: Secondary | ICD-10-CM

## 2019-05-18 NOTE — Assessment & Plan Note (Signed)
History of inferior ST segment elevation myocardial infarction 03/19/2013.  I performed PCI drug-eluting stenting of the mid AV groove circumflex with a 2.25 mm x 15 mm long drug-eluting stent.  EF was normal.  She said no recurrent symptoms.  She remains on aspirin and Plavix.

## 2019-05-18 NOTE — Assessment & Plan Note (Signed)
History of essential hypertension blood pressure measured today 146/68.  She is on hydrochlorothiazide, lisinopril and metoprolol.

## 2019-05-18 NOTE — Progress Notes (Signed)
05/18/2019 [name]   1950/09/20  [id number]  Primary Physician White, Orlene Och, NP Primary Cardiologist: Lorretta Harp MD Garret Reddish, [place], [place]  HPI:  Yvonne Shaw is a 69 y.o.  Married [place] American female mother of 2, grandmother and 6 grandchildren he worked as a Marine scientist at [employer], I retired in 2020.. I last saw her in the office 08/06/2016.Marland KitchenShe had no prior cardiac history, PMHx s/f h/o CVA, HTN, HLD and obesity who was admitted to [facility] on 03/19/13 with an inferior STEMI. She reports being in her USOH until ~ 9PM last night when she began experiencing intermittent substernal chest burning w/o radiation or associated symptoms. She was not initially concerned and attributed this to indigestion. She went to sleep and was awoken at 6AM by the same discomfort which had increased to a 9/10 in intensity and with associated nausea. She got up and tried to busy herself around the house, however the discomfort persisted. She alerted her husband who called 911. Serial EKG tracings in the field reveal fluctuating ST segments (50m elevation to isoelectric) in leads III, aVF. [facility]. She did receive ASA x 4 and heparin bolus. [facility]. 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 She was just been discharged 2 days later. Her only problems have been episodic shortness of breath probably related to the [place] did have an episode of chest pain in May [facility]. While performing cardiac rehabilitation care it was noted that she had frequent PVCs on telemetry although she was unaware of this.patient wore a 30 day event monitor which did show some short runs of nonsustained ventricular tachycardia. Since I saw her one year  ago she has remained stable. Her Brilenta was changed to Plavix because of shortness of breath .  Since I saw her 2 years ago she continues to do well.  She complains of some increasing shortness of breath and is also gained some weight.  She denies chest pain.   Current Meds  Medication Sig  . aspirin 81 MG tablet Take 81 mg by mouth daily.  .Marland Kitchenatorvastatin (LIPITOR) 80 MG tablet Take 1 tablet (80 mg total) by mouth daily at 6 PM.  . Blood Glucose Monitoring Suppl (ONE TOUCH ULTRA SYSTEM KIT) W/DEVICE KIT 1 kit by Does not apply route once.  . Cinnamon 500 MG TABS Take 1-2 tablets by mouth daily.  . clopidogrel (PLAVIX) 75 MG tablet Take 1 tablet (75 mg total) by mouth daily.  .Marland KitchenGLIPIZIDE XL 5 MG 24 hr tablet Take 1 tablet by mouth daily.  . hydrochlorothiazide (MICROZIDE) 12.5 MG capsule Take 1 capsule (12.5 mg total) by mouth daily.  .Marland Kitchenlisinopril (PRINIVIL,ZESTRIL) 20 MG tablet Take 20 mg by mouth daily.  . metoprolol tartrate (LOPRESSOR) 25 MG tablet Take 0.5 tablets (12.5 mg total) by mouth 2 (two) times daily.  . nitroGLYCERIN (NITROSTAT) 0.4 MG SL tablet Place 1 tablet (0.4 mg total) under the tongue every 5 (five) minutes x 3 doses as needed for chest pain.  . predniSONE (DELTASONE) 10 MG tablet Days 1-5:  6 tabs PO daily Days 6-7:  5 tabs PO daily Days 8-9:  4 tabs PO daily Days 10-11:  3 tabs PO daily Days 12-13:  2 tabs PO daily  Days 14-15:  1 tab PO daily     Allergies  Allergen Reactions  . Aggrenox [Aspirin-Dipyridamole Er] Other (See Comments), Nausea Only and Nausea And Vomiting    Severe headache  . Carvedilol Other (See Comments)    caused fatigue  . Persa-Gel [Benzoyl Peroxide]     Social History   Socioeconomic History  . Marital status: Married    Spouse name: Not on file  . Number of children: Not on file  . Years of education: Not on file  . Highest education level: Not on file  Occupational History  . Not on file  Tobacco Use  . Smoking status:  Never Smoker  . Smokeless tobacco: Never Used  Substance and Sexual Activity  . Alcohol use: No  . Drug use: No  . Sexual activity: Not on file  Other Topics Concern  . Not on file  Social History Narrative   Married, 2 healthy children. Lives in [place], [place]. Works at [employer].   Social Determinants of Health   Financial Resource Strain:   . Difficulty of Paying Living Expenses:   Food Insecurity:   . Worried About Charity fundraiser in the Last Year:   . Arboriculturist in the Last Year:   Transportation Needs:   . Film/video editor (Medical):   [name] Lack of Transportation (Non-Medical):   Physical Activity:   . Days of Exercise per Week:   . Minutes of Exercise per Session:   Stress:   . Feeling of Stress :   Social Connections:   . Frequency of Communication with Friends and Family:   . Frequency of Social Gatherings with Friends and Family:   . Attends Religious Services:   . Active Member of Clubs or Organizations:   . Attends Archivist Meetings:   [name] Marital Status:   Intimate Partner Violence:   . Fear of Current or Ex-Partner:   . Emotionally Abused:   [name] Physically Abused:   . Sexually Abused:      Review of Systems: General: negative for chills, fever, night sweats or weight changes.  Cardiovascular: negative for chest pain, dyspnea on exertion, edema, orthopnea, palpitations, paroxysmal nocturnal dyspnea or shortness of breath Dermatological: negative for rash Respiratory: negative for cough or wheezing Urologic: negative for hematuria Abdominal: negative for nausea, vomiting, diarrhea, bright red blood per rectum, melena, or hematemesis Neurologic: negative for visual changes, syncope, or dizziness All other systems reviewed and are otherwise negative except as noted above.    Blood pressure (!) 146/68, pulse 68, height '5\' 6"'$  (1.676 m), weight 249 lb (112.9 kg).  General appearance: alert and no distress Neck: no adenopathy, no carotid bruit,  no JVD, supple, symmetrical, trachea midline and thyroid not enlarged, symmetric, no tenderness/mass/nodules Lungs: clear to auscultation bilaterally Heart: regular rate and rhythm, S1, S2 normal, no murmur, click, rub or gallop Extremities: extremities normal, atraumatic, no cyanosis or edema Pulses: 2+ and symmetric Skin: Skin color, texture, turgor normal. No rashes or lesions Neurologic: Alert and oriented X 3, normal strength and tone. Normal symmetric reflexes. Normal coordination and gait  EKG sinus rhythm at 68 without ST or T wave changes.  I personally reviewed this EKG.  ASSESSMENT AND PLAN:   ST elevation myocardial infarction (STEMI) of inferior wall History of inferior ST segment elevation myocardial infarction 03/19/2013.  I performed PCI drug-eluting stenting of the mid AV groove circumflex with a 2.25 mm x 15 mm long drug-eluting stent.  EF was  normal.  She said no recurrent symptoms.  She remains on aspirin and Plavix.  HTN (hypertension) History of essential hypertension blood pressure measured today 146/68.  She is on hydrochlorothiazide, lisinopril and metoprolol.  Dyslipidemia, goal LDL below 70 History of dyslipidemia on statin therapy followed by her PCP      Lorretta Harp MD [facility], [facility] 05/18/2019 3:48 PM

## 2019-05-18 NOTE — Assessment & Plan Note (Signed)
History of dyslipidemia on statin therapy followed by her PCP 

## 2019-05-18 NOTE — Patient Instructions (Signed)

## 2019-05-30 NOTE — Addendum Note (Signed)
Addended by: Zebedee Iba on: 05/30/2019 04:34 PM   Modules accepted: Orders

## 2019-09-20 ENCOUNTER — Other Ambulatory Visit: Payer: Self-pay

## 2019-09-20 DIAGNOSIS — Z1231 Encounter for screening mammogram for malignant neoplasm of breast: Secondary | ICD-10-CM

## 2019-10-04 ENCOUNTER — Other Ambulatory Visit: Payer: Self-pay

## 2019-10-04 ENCOUNTER — Ambulatory Visit
Admission: RE | Admit: 2019-10-04 | Discharge: 2019-10-04 | Disposition: A | Discharge status: Home / Self Care | Payer: Medicare HMO | Source: Ambulatory Visit

## 2019-10-04 DIAGNOSIS — Z1231 Encounter for screening mammogram for malignant neoplasm of breast: Secondary | ICD-10-CM | POA: Insufficient documentation

## 2019-11-16 ENCOUNTER — Other Ambulatory Visit: Payer: Self-pay

## 2019-11-16 ENCOUNTER — Encounter: Payer: Self-pay | Admitting: Cardiovascular Disease

## 2019-11-16 ENCOUNTER — Ambulatory Visit: Payer: Medicare HMO | Admitting: Cardiovascular Disease

## 2019-11-16 VITALS — BP 142/70 | HR 59 | Ht 67.0 in | Wt 249.4 lb

## 2019-11-16 DIAGNOSIS — I10 Essential (primary) hypertension: Secondary | ICD-10-CM

## 2019-11-16 DIAGNOSIS — R634 Abnormal weight loss: Secondary | ICD-10-CM

## 2019-11-16 DIAGNOSIS — I2119 ST elevation (STEMI) myocardial infarction involving other coronary artery of inferior wall: Secondary | ICD-10-CM | POA: Diagnosis not present

## 2019-11-16 DIAGNOSIS — E785 Hyperlipidemia, unspecified: Secondary | ICD-10-CM

## 2019-11-16 NOTE — Assessment & Plan Note (Signed)
History of inferior STEMI status post PCI and drug-eluting stenting of her AV groove circumflex by myself 03/19/2013 with a 2.25 mm x 15 mm long drug-eluting stent.  Her EF was normal.  She remains on aspirin Plavix.  She denies chest pain but does have some chronic dyspnea probably related to her weight.

## 2019-11-16 NOTE — Progress Notes (Signed)
11/16/2019 Yvonne Shaw   [birth year]  [id number]  Primary Physician D, Ples Specter, FNP Primary Cardiologist: Lorretta Harp MD Garret Reddish, [place], [place]  HPI:  Yvonne Shaw is a 69 y.o.   Married Yvonne Shaw female mother of 2, grandmother and 6 grandchildren he worked as a Marine scientist at [employer], I retired in 2020.. I last saw her in the office  05/18/2019.Marland KitchenShe had no prior cardiac history, PMHx s/f h/o CVA, HTN, HLD and obesity who was admitted to [facility] on 03/19/13 with an inferior STEMI. She reports being in her USOH until ~ 9PM last night when she began experiencing intermittent substernal chest burning w/o radiation or associated symptoms. She was not initially concerned and attributed this to indigestion. She went to sleep and was awoken at 6AM by the same discomfort which had increased to a 9/10 in intensity and with associated nausea. She got up and tried to busy herself around the house, however the discomfort persisted. She alerted her husband who called 911. Serial EKG tracings in the field reveal fluctuating ST segments (43mm elevation to isoelectric) in leads III, aVF. She was transported initially to [facility] ED. Code STEMI was called. She did receive ASA x 4 and heparin bolus. She was diverted to [facility] ED for emergent cardiac catheterization. 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 She was just been discharged 2 days later. Her only problems have been episodic shortness of breath probably related to the [place].she did have an episode of chest pain in May underwent Myoview stress [facility] at [facility] which was nonischemic. While performing cardiac rehabilitation care it was noted that she had frequent PVCs on telemetry although she was unaware of this.patient wore a 30 day event monitor which did show some short runs of nonsustained ventricular tachycardia. Since I saw her one year ago  she has remained stable. Her Brilenta was changed to Plavix because of shortness of breath .  Since I saw her 6 months ago she continues to do well.  Her weight is stable to 49.  She does continue to complain of some dyspnea exertion but denies chest pain.  She did have a lipid profile performed by her PCP 07/18/2019 revealing total cholesterol 183, LDL of 113 and HDL 50 on high-dose atorvastatin.  We talked about the importance of diet, exercise and weight reduction.   Current Meds  Medication Sig  . aspirin 81 MG tablet Take 81 mg by mouth daily.  [name] atorvastatin (LIPITOR) 80 MG tablet Take 1 tablet (80 mg total) by mouth daily at 6 PM.  . Blood Glucose Monitoring Suppl (ONE TOUCH ULTRA SYSTEM KIT) W/DEVICE KIT 1 kit by Does not apply route once.  . Cholecalciferol (VITAMIN D) 50 MCG (2000 UT) CAPS Take 2,000 Units by mouth daily.  . Cinnamon 500 MG TABS Take 1-2 tablets by mouth daily.  . clopidogrel (PLAVIX) 75 MG tablet Take 1 tablet (75 mg total) by mouth daily.  [name] GLIPIZIDE XL 5 MG 24 hr tablet Take 1 tablet by mouth daily.  . hydrochlorothiazide (MICROZIDE) 12.5 MG capsule Take 1 capsule (12.5 mg total) by mouth daily.  [name] lisinopril (PRINIVIL,ZESTRIL) 20 MG tablet Take 20 mg by mouth daily.  . metoprolol tartrate (LOPRESSOR) 25 MG tablet Take 25 mg by mouth daily.  . nitroGLYCERIN (NITROSTAT) 0.4 MG SL tablet Place 1 tablet (0.4 mg total) under the tongue every 5 (five) minutes  x 3 doses as needed for chest pain.     Allergies  Allergen Reactions  . Aggrenox [Aspirin-Dipyridamole Er] Other (See Comments), Nausea Only and Nausea And Vomiting    Severe headache  . Carvedilol Other (See Comments)    caused fatigue  . Persa-Gel [Benzoyl Peroxide]     Social History   Socioeconomic History  . Marital status: Married    Spouse name: Not on file  . Number of children: Not on file  . Years of education: Not on file  . Highest education level: Not on file  Occupational History  .  Not on file  Tobacco Use  . Smoking status: Never Smoker  . Smokeless tobacco: Never Used  Substance and Sexual Activity  . Alcohol use: No  . Drug use: No  . Sexual activity: Not on file  Other Topics Concern  . Not on file  Social History Narrative   Married, 2 healthy children. Lives in [place], [place]. Works at [employer].   Social Determinants of Health   Financial Resource Strain:   . Difficulty of Paying Living Expenses: Not on file  Food Insecurity:   . Worried About Programme researcher, broadcasting/film/video in the Last Year: Not on file  . Ran Out of Food in the Last Year: Not on file  Transportation Needs:   . Lack of Transportation (Medical): Not on file  . Lack of Transportation (Non-Medical): Not on file  Physical Activity:   . Days of Exercise per Week: Not on file  . Minutes of Exercise per Session: Not on file  Stress:   . Feeling of Stress : Not on file  Social Connections:   . Frequency of Communication with Friends and Family: Not on file  . Frequency of Social Gatherings with Friends and Family: Not on file  . Attends Religious Services: Not on file  . Active Member of Clubs or Organizations: Not on file  . Attends Banker Meetings: Not on file  . Marital Status: Not on file  Intimate Partner Violence:   . Fear of Current or Ex-Partner: Not on file  . Emotionally Abused: Not on file  . Physically Abused: Not on file  . Sexually Abused: Not on file     Review of Systems: General: negative for chills, fever, night sweats or weight changes.  Cardiovascular: negative for chest pain, dyspnea on exertion, edema, orthopnea, palpitations, paroxysmal nocturnal dyspnea or shortness of breath Dermatological: negative for rash Respiratory: negative for cough or wheezing Urologic: negative for hematuria Abdominal: negative for nausea, vomiting, diarrhea, bright red blood per rectum, melena, or hematemesis Neurologic: negative for visual changes, syncope, or dizziness All other  systems reviewed and are otherwise negative except as noted above.    Blood pressure (!) 142/70, pulse (!) 59, height 5\' 7"  (1.702 m), weight 249 lb 6.4 oz (113.1 kg).  General appearance: alert and no distress Neck: no adenopathy, no carotid bruit, no JVD, supple, symmetrical, trachea midline and thyroid not enlarged, symmetric, no tenderness/mass/nodules Lungs: clear to auscultation bilaterally Heart: regular rate and rhythm, S1, S2 normal, no murmur, click, rub or gallop Extremities: extremities normal, atraumatic, no cyanosis or edema Pulses: 2+ and symmetric Skin: Skin color, texture, turgor normal. No rashes or lesions Neurologic: Alert and oriented X 3, normal strength and tone. Normal symmetric reflexes. Normal coordination and gait  EKG sinus bradycardia at 59 without ST or T wave changes.  I personally reviewed this EKG.  ASSESSMENT AND PLAN:   ST elevation  myocardial infarction (STEMI) of inferior wall History of inferior STEMI status post PCI and drug-eluting stenting of her AV groove circumflex by myself 03/19/2013 with a 2.25 mm x 15 mm long drug-eluting stent.  Her EF was normal.  She remains on aspirin Plavix.  She denies chest pain but does have some chronic dyspnea probably related to her weight.  HTN (hypertension) History of essential hypertension a blood pressure measured today 142/70.  She is on HydroDIURIL, metoprolol and lisinopril.  Dyslipidemia, goal LDL below 70 History of dyslipidemia on high-dose atorvastatin with lipid profile performed 07/18/2019 revealing total cholesterol 183, LDL 113 and HDL 50.      Lorretta Harp MD FACP,FACC,FAHA, [facility] 11/16/2019 10:45 AM

## 2019-11-16 NOTE — Assessment & Plan Note (Signed)
History of dyslipidemia on high-dose atorvastatin with lipid profile performed 07/18/2019 revealing total cholesterol 183, LDL 113 and HDL 50.

## 2019-11-16 NOTE — Patient Instructions (Addendum)
Medication Instructions:  Continue current medications  *If you need a refill on your cardiac medications before your next appointment, please call your pharmacy*   Lab Work: None Ordered    Testing/Procedures: None Ordered   Follow-Up: At [facility], you and your health needs are our priority.  As part of our continuing mission to provide you with exceptional heart care, we have created designated Provider Care Teams.  These Care Teams include your primary Cardiologist (physician) and Advanced Practice Providers (APPs -  Physician Assistants and Nurse Practitioners) who all work together to provide you with the care you need, when you need it.  We recommend signing up for the patient portal called "MyChart".  Sign up information is provided on this After Visit Summary.  MyChart is used to connect with patients for Virtual Visits (Telemedicine).  Patients are able to view lab/test results, encounter notes, upcoming appointments, etc.  Non-urgent messages can be sent to your provider as well.   To learn more about what you can do with MyChart, go to [email or web address].    Your next appointment:   1 year(s)   The format for your next appointment:   In Person  Provider:   You may see Quay Burow, MD or one of the following Advanced Practice Providers on your designated Care Team:    Kerin Ransom, PA-C  [place], [place]  Coletta Memos, FNP    Other  You have been referred to [facility]  You have been referred to [facility] weight loss management

## 2019-11-16 NOTE — Assessment & Plan Note (Signed)
History of essential hypertension a blood pressure measured today 142/70.  She is on HydroDIURIL, metoprolol and lisinopril.

## 2020-02-03 ENCOUNTER — Telehealth (INDEPENDENT_AMBULATORY_CARE_PROVIDER_SITE_OTHER): Payer: Medicare HMO | Admitting: Internal Medicine

## 2020-02-03 ENCOUNTER — Encounter: Payer: Self-pay | Admitting: Internal Medicine

## 2020-02-03 VITALS — Ht 66.0 in | Wt 247.0 lb

## 2020-02-03 DIAGNOSIS — E785 Hyperlipidemia, unspecified: Secondary | ICD-10-CM

## 2020-02-03 DIAGNOSIS — I2119 ST elevation (STEMI) myocardial infarction involving other coronary artery of inferior wall: Secondary | ICD-10-CM

## 2020-02-03 DIAGNOSIS — I10 Essential (primary) hypertension: Secondary | ICD-10-CM | POA: Diagnosis not present

## 2020-02-03 NOTE — Progress Notes (Addendum)
Virtual Visit via Video Note   This visit type was conducted due to national recommendations for restrictions regarding the COVID-19 Pandemic (e.g. social distancing) in an effort to limit this patient's exposure and mitigate transmission in our community.  Due to her co-morbid illnesses, this patient is at least at moderate risk for complications without adequate follow up.  This format is felt to be most appropriate for this patient at this time.  All issues noted in this document were discussed and addressed.  A limited physical exam was performed with this format.  Please refer to the patient's chart for her consent to telehealth for [facility].      Date:  02/03/2020   ID:  Yvonne Shaw, DOB [birth year], MRN [id number] The patient was identified using 2 identifiers.  Evaluation Performed:  New Patient Evaluation  Patient Location:  [place] [address]  Provider location:   [address], [address] [place], [place] [address]  PCP:  Earlie Counts, FNP  Cardiologist:  No primary care provider on file. Electrophysiologist:  None   Chief Complaint:  Manage dyslipidemia  History of Present Illness:    Yvonne Shaw is a 70 y.o. female who presents via audio/video conferencing for a telehealth visit today. This is a pleasant 70 year old female kindly referred by Dr. Gwenlyn Found for evaluation and management of dyslipidemia. She has a history of coronary artery disease with ST elevation MI in 2015 and is status post PCI to the circumflex artery at that time. Since then she is done well. Other risk factors include diabetes, hypertension and obesity. She does have a mixed dyslipidemia. Her last cholesterol numbers were in 7/21 through [facility], indicating total cholesterol 183, LDL 113 and HDL 50. Since then she says she is worked on her diet and is actively exercising more and working on weight loss. Her target LDL is less than 70. Current therapy includes atorvastatin 80  mg daily which she is tolerating well and has been on since her ST elevation MI.  The patient does not have symptoms concerning for COVID-19 infection (fever, chills, cough, or new SHORTNESS OF BREATH).    Prior CV studies:   The following studies were reviewed today:  Chart reviewed, lab work  PMHx:  Past Medical History:  Diagnosis Date  . Anginal pain ([place])   . CAD S/P percutaneous coronary angioplasty 03/19/13   s/p 2.25 mm x 15 mm (2.46) DES to mid AV Groove Circumflex  . 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 Diabetes mellitus type 2 in obese ([place])   . GERD (gastroesophageal reflux disease)   . Hyperlipidemia LDL goal <70   . Hypertension   . Nonsustained ventricular tachycardia ([place])    found on exam monitoring, asymptomatic  . Obesity   . Orthopnea   . PVC's (premature ventricular contractions)    seen on telemetry during cardiac rehabilitation  . Shortness of breath dyspnea    due to brilinta  . ST elevation myocardial infarction (STEMI) of inferolateral wall, subsequent episode of care ([place]) 03/19/13   s/p DES to mid AV Groove Circumflex; EF 50% on cath  . Stroke [facility])    2001    Past Surgical History:  Procedure Laterality Date  . ABDOMINAL HYSTERECTOMY    . CATARACT EXTRACTION W/PHACO Right 09/05/2014   Procedure: CATARACT EXTRACTION PHACO AND INTRAOCULAR LENS PLACEMENT (IOC);  Surgeon: Birder Robson, MD;  Location: [facility] ORS;  Service: Ophthalmology;  Laterality: Right;  [place]: 01:12.2 AP%: 20.1 CDE: 14.45 Lot # [id number]  . CHOLECYSTECTOMY    . COLONOSCOPY WITH PROPOFOL N/A 07/02/2015   Procedure: COLONOSCOPY WITH PROPOFOL;  Surgeon: Manya Silvas, MD;  Location: [facility] ENDOSCOPY;  Service: Endoscopy;  Laterality: N/A;  . LEFT HEART CATHETERIZATION WITH CORONARY ANGIOGRAM N/A 03/19/2013   Procedure: LEFT HEART CATHETERIZATION WITH CORONARY ANGIOGRAM;  Surgeon: Lorretta Harp, MD;  Location: [facility] CATH LAB;  Service: Cardiovascular;  Laterality:  N/A;  . PERCUTANEOUS CORONARY STENT INTERVENTION (PCI-S)  03/19/2013   PCI to mCx - 90% lesion: 2.25 mm x 15 mm Xience Alpine DES (2.46 mm post-dilation); EF ~50%    FAMHx:  Family History  Problem Relation Age of Onset  . Cancer Mother   . Diabetes Mother   . Hypertension Mother   . Hypertension Father   . Cancer Father   . Diabetes Father   . Hypertension Sister   . Diabetes Sister   . Heart failure Brother   . Diabetes Brother   . Heart attack Maternal Grandfather   . Breast cancer Neg Hx     SOCHx:   reports that she has never smoked. She has never used smokeless tobacco. She reports that she does not drink alcohol and does not use drugs.  ALLERGIES:  Allergies  Allergen Reactions  . Aggrenox [Aspirin-Dipyridamole Er] Other (See Comments), Nausea Only and Nausea And Vomiting    Severe headache  . Carvedilol Other (See Comments)    caused fatigue  . Persa-Gel [Benzoyl Peroxide]     MEDS:  Current Meds  Medication Sig  . aspirin 81 MG tablet Take 81 mg by mouth daily.  [name] atorvastatin (LIPITOR) 80 MG tablet Take 1 tablet (80 mg total) by mouth daily at 6 PM.  . Blood Glucose Monitoring Suppl (ONE TOUCH ULTRA SYSTEM KIT) W/DEVICE KIT 1 kit by Does not apply route once.  . Cholecalciferol (VITAMIN D) 50 MCG (2000 UT) CAPS Take 2,000 Units by mouth daily.  . clopidogrel (PLAVIX) 75 MG tablet Take 1 tablet (75 mg total) by mouth daily.  [name] GLIPIZIDE XL 5 MG 24 hr tablet Take 1 tablet by mouth daily.  . hydrochlorothiazide (MICROZIDE) 12.5 MG capsule Take 1 capsule (12.5 mg total) by mouth daily.  [name] lisinopril (PRINIVIL,ZESTRIL) 20 MG tablet Take 20 mg by mouth daily.  . metoprolol tartrate (LOPRESSOR) 25 MG tablet Take 25 mg by mouth daily.  . nitroGLYCERIN (NITROSTAT) 0.4 MG SL tablet Place 1 tablet (0.4 mg total) under the tongue every 5 (five) minutes x 3 doses as needed for chest pain.  . [DISCONTINUED] Cinnamon 500 MG TABS Take 1-2 tablets by mouth daily.      ROS: Pertinent items noted in HPI and remainder of comprehensive ROS otherwise negative.  Labs/Other Tests and Data Reviewed:    Recent Labs: No results found for requested labs within last 8760 hours.   Recent Lipid Panel Lab Results  Component Value Date/Time   CHOL 140 06/17/2013 02:03 PM   TRIG 78 06/17/2013 02:03 PM   HDL 49 06/17/2013 02:03 PM   CHOLHDL 2.9 06/17/2013 02:03 PM   LDLCALC 75 06/17/2013 02:03 PM    Wt Readings from Last 3 Encounters:  02/03/20 247 lb (112 kg)  11/16/19 249 lb 6.4 oz (113.1 kg)  05/18/19 249 lb (112.9 kg)     Exam:    Vital Signs:  Ht $R'5\' 6"'CE$  (1.676 m)   Wt 247 lb (112 kg)   BMI 39.87 kg/m  General appearance: alert and no distress Lungs: No visual respiratory difficulty Abdomen: Moderately obese Extremities: extremities normal, atraumatic, no cyanosis or edema Skin: Skin color, texture, turgor normal. No rashes or lesions Neurologic: Grossly normal Psych: Pleasant  ASSESSMENT & PLAN:    1. Mixed dyslipidemia, goal LDL less than 70 2. Coronary artery disease status post STEMI in 2015, status post PCI to the circumflex 3. Type 2 diabetes 4. Hypertension 5. Morbid obesity  Yvonne Shaw has known CAD with a target LDL <70. She will need repeat labs and if her LDL is still above target, would recommend adding zetia if <100 or possibly a PCSK9i if the LDL is still much greater than 100. Repeat labs in 3 months and follow-up with me at that time.  Thanks as always for the kind referral.  COVID-19 Education: The signs and symptoms of COVID-19 were discussed with the patient and how to seek care for testing (follow up with PCP or arrange E-visit).  The importance of social distancing was discussed today.  Patient Risk:   After full review of this patients clinical status, I feel that they are at least moderate risk at this time.  Time:   Today, I have spent 25 minutes with the patient with telehealth technology discussing  dyslipidemia, target LDL cholesterol, medication management..     Medication Adjustments/Labs and Tests Ordered: Current medicines are reviewed at length with the patient today.  Concerns regarding medicines are outlined above.   Tests Ordered: No orders of the defined types were placed in this encounter.   Medication Changes: No orders of the defined types were placed in this encounter.   Disposition:  in 3 month(s)  Pixie Casino, MD, [facility], [place] Director of the Advanced Lipid Disorders &  Cardiovascular Risk Reduction Clinic Diplomate of the American Board of Clinical Lipidology Attending Cardiologist  Direct Dial: [phone]  Fax: [phone]  Website:  [email or web address]  Pixie Casino, MD  02/03/2020 9:07 AM

## 2020-02-03 NOTE — Patient Instructions (Signed)
Medication Instructions:  Your physician recommends that you continue on your current medications as directed. Please refer to the Current Medication list given to you today.  Lab Work: Please return for FASTING labs  (Lipid)-this can be completed at [facility] in [place]: At [facility], you and your health needs are our priority.  As part of our continuing mission to provide you with exceptional heart care, we have created designated Provider Care Teams.  These Care Teams include your primary Cardiologist (physician) and Advanced Practice Providers (APPs -  Physician Assistants and Nurse Practitioners) who all work together to provide you with the care you need, when you need it.  We recommend signing up for the patient portal called "MyChart".  Sign up information is provided on this After Visit Summary.  MyChart is used to connect with patients for Virtual Visits (Telemedicine).  Patients are able to view lab/test results, encounter notes, upcoming appointments, etc.  Non-urgent messages can be sent to your provider as well.   To learn more about what you can do with MyChart, go to [email or web address].    Your next appointment:   3 month(s)  The format for your next appointment:   Virtual Visit   Provider:   K. Mali Hilty, MD-Lipid clinic

## 2020-02-10 LAB — LIPID PANEL
Chol/HDL Ratio: 3.3 ratio (ref 0.0–4.4)
Cholesterol, Total: 149 mg/dL (ref 100–199)
HDL: 45 mg/dL (ref >39)
LDL Chol Calc (NIH): 89 mg/dL (ref 0–99)
Triglycerides: 79 mg/dL (ref 0–149)
VLDL Cholesterol Cal: 15 mg/dL (ref 5–40)

## 2020-02-13 ENCOUNTER — Other Ambulatory Visit: Payer: Self-pay | Admitting: *Deleted

## 2020-02-13 DIAGNOSIS — E785 Hyperlipidemia, unspecified: Secondary | ICD-10-CM

## 2020-02-13 MED ORDER — EZETIMIBE 10 MG PO TABS: 10.0000 mg | ORAL_TABLET | Freq: Every day | ORAL | 3 refills | Status: DC

## 2020-04-12 ENCOUNTER — Other Ambulatory Visit: Payer: Self-pay | Admitting: *Deleted

## 2020-04-12 DIAGNOSIS — E785 Hyperlipidemia, unspecified: Secondary | ICD-10-CM

## 2020-05-12 LAB — LIPID PANEL
Chol/HDL Ratio: 2.9 ratio (ref 0.0–4.4)
Cholesterol, Total: 138 mg/dL (ref 100–199)
HDL: 48 mg/dL (ref >39)
LDL Chol Calc (NIH): 66 mg/dL (ref 0–99)
Triglycerides: 137 mg/dL (ref 0–149)
VLDL Cholesterol Cal: 24 mg/dL (ref 5–40)

## 2020-05-15 ENCOUNTER — Telehealth (INDEPENDENT_AMBULATORY_CARE_PROVIDER_SITE_OTHER): Payer: Medicare HMO | Admitting: Internal Medicine

## 2020-05-15 ENCOUNTER — Encounter: Payer: Self-pay | Admitting: Internal Medicine

## 2020-05-15 VITALS — BP 132/78 | HR 60 | Wt 236.0 lb

## 2020-05-15 DIAGNOSIS — I2510 Atherosclerotic heart disease of native coronary artery without angina pectoris: Secondary | ICD-10-CM | POA: Diagnosis not present

## 2020-05-15 DIAGNOSIS — E785 Hyperlipidemia, unspecified: Secondary | ICD-10-CM | POA: Diagnosis not present

## 2020-05-15 NOTE — Progress Notes (Addendum)
Virtual Visit via Telephone Note   This visit type was conducted due to national recommendations for restrictions regarding the COVID-19 Pandemic (e.g. social distancing) in an effort to limit this patient's exposure and mitigate transmission in our community.  Due to her co-morbid illnesses, this patient is at least at moderate risk for complications without adequate follow up.  This format is felt to be most appropriate for this patient at this time.  All issues noted in this document were discussed and addressed.  No physical exam was performed with this format.  Please refer to the patient's chart for her consent to telehealth for [facility].      Date:  05/15/2020   ID:  Yvonne Shaw, DOB 07-14-1950, MRN [id number] The patient was identified using 2 identifiers.  Evaluation Performed:  Follow-Up Visit  Patient Location:  [place] [address]  Provider location:   [address], [address] [place], [place] [address]  PCP:  Earlie Counts, FNP  Cardiologist:  None Electrophysiologist:  None   Chief Complaint:  Manage dyslipidemia  History of Present Illness:    Yvonne Shaw is a 71 y.o. female who presents via telephone conferencing for a telehealth visit today. This is a pleasant 70 year old female kindly referred by Dr. Gwenlyn Found for evaluation and management of dyslipidemia. She has a history of coronary artery disease with ST elevation MI in 2015 and is status post PCI to the circumflex artery at that time. Since then she is done well. Other risk factors include diabetes, hypertension and obesity. She does have a mixed dyslipidemia. Her last cholesterol numbers were in 7/21 through [facility], indicating total cholesterol 183, LDL 113 and HDL 50. Since then she says she is worked on her diet and is actively exercising more and working on weight loss. Her target LDL is less than 70. Current therapy includes atorvastatin 80 mg daily which she is tolerating well and  has been on since her ST elevation MI.  05/15/2020  Yvonne Shaw was seen today for telephone follow-up.  We could not connect via video chat for unknown reasons.  She did start taking ezetimibe after her last labs.  Repeat labs were performed about 4 days ago.  He has had an expected improvement in her lipids with total cholesterol down to 138, triglycerides 137, HDL 48 and LDL 66.  She seems to be tolerating ezetimibe well.  The patient does not have symptoms concerning for COVID-19 infection (fever, chills, cough, or new SHORTNESS OF BREATH).    Prior CV studies:   The following studies were reviewed today:  Chart reviewed, lab work  PMHx:  Past Medical History:  Diagnosis Date  . Anginal pain ([place])   . CAD S/P percutaneous coronary angioplasty 03/19/13   s/p 2.25 mm x 15 mm (2.46) DES to mid AV Groove Circumflex  . 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 Diabetes mellitus type 2 in obese ([place])   . GERD (gastroesophageal reflux disease)   . Hyperlipidemia LDL goal <70   . Hypertension   . Nonsustained ventricular tachycardia ([place])    found on exam monitoring, asymptomatic  . Obesity   . Orthopnea   . PVC's (premature ventricular contractions)    seen on telemetry during cardiac rehabilitation  . Shortness of breath dyspnea    due to brilinta  . ST elevation myocardial infarction (STEMI) of inferolateral wall, subsequent episode of care [facility]) 03/19/13   s/p DES to  mid AV Groove Circumflex; EF 50% on cath  . Stroke [facility])    2001    Past Surgical History:  Procedure Laterality Date  . ABDOMINAL HYSTERECTOMY    . CATARACT EXTRACTION W/PHACO Right 09/05/2014   Procedure: CATARACT EXTRACTION PHACO AND INTRAOCULAR LENS PLACEMENT (IOC);  Surgeon: Birder Robson, MD;  Location: [facility] ORS;  Service: Ophthalmology;  Laterality: Right;  [place]: 01:12.2 AP%: 20.1 CDE: 14.45 Lot # [id number]  . CHOLECYSTECTOMY    . COLONOSCOPY WITH PROPOFOL N/A 07/02/2015   Procedure:  COLONOSCOPY WITH PROPOFOL;  Surgeon: Manya Silvas, MD;  Location: [facility] ENDOSCOPY;  Service: Endoscopy;  Laterality: N/A;  . LEFT HEART CATHETERIZATION WITH CORONARY ANGIOGRAM N/A 03/19/2013   Procedure: LEFT HEART CATHETERIZATION WITH CORONARY ANGIOGRAM;  Surgeon: Lorretta Harp, MD;  Location: [facility] CATH LAB;  Service: Cardiovascular;  Laterality: N/A;  . PERCUTANEOUS CORONARY STENT INTERVENTION (PCI-S)  03/19/2013   PCI to mCx - 90% lesion: 2.25 mm x 15 mm Xience Alpine DES (2.46 mm post-dilation); EF ~50%    FAMHx:  Family History  Problem Relation Age of Onset  . Cancer Mother   . Diabetes Mother   . Hypertension Mother   . Hypertension Father   . Cancer Father   . Diabetes Father   . Hypertension Sister   . Diabetes Sister   . Heart failure Brother   . Diabetes Brother   . Heart attack Maternal Grandfather   . Breast cancer Neg Hx     SOCHx:   reports that she has never smoked. She has never used smokeless tobacco. She reports that she does not drink alcohol and does not use drugs.  ALLERGIES:  Allergies  Allergen Reactions  . Aggrenox [Aspirin-Dipyridamole Er] Other (See Comments), Nausea Only and Nausea And Vomiting    Severe headache  . Carvedilol Other (See Comments)    caused fatigue  . Persa-Gel [Benzoyl Peroxide]     MEDS:  Current Meds  Medication Sig  . amLODipine (NORVASC) 10 MG tablet Take 1 tablet by mouth. Twice weekly per patient preference  . aspirin 81 MG tablet Take 81 mg by mouth daily.  [name] atorvastatin (LIPITOR) 80 MG tablet Take 1 tablet (80 mg total) by mouth daily at 6 PM.  . Blood Glucose Monitoring Suppl (ONE TOUCH ULTRA SYSTEM KIT) W/DEVICE KIT 1 kit by Does not apply route once.  . Cholecalciferol (VITAMIN D) 50 MCG (2000 UT) CAPS Take 6,000 Units by mouth daily.  . clopidogrel (PLAVIX) 75 MG tablet Take 1 tablet (75 mg total) by mouth daily.  [name] GLIPIZIDE XL 5 MG 24 hr tablet Take 1 tablet by mouth daily.  . hydrochlorothiazide  (MICROZIDE) 12.5 MG capsule Take 1 capsule (12.5 mg total) by mouth daily.  [name] lisinopril (PRINIVIL,ZESTRIL) 20 MG tablet Take 20 mg by mouth daily.  . metoprolol tartrate (LOPRESSOR) 25 MG tablet Take 25 mg by mouth daily.  . nitroGLYCERIN (NITROSTAT) 0.4 MG SL tablet Place 1 tablet (0.4 mg total) under the tongue every 5 (five) minutes x 3 doses as needed for chest pain.  [name] omeprazole (PRILOSEC) 40 MG capsule Take by mouth. Take 1 capsules by mouth once daily in morning     ROS: Pertinent items noted in HPI and remainder of comprehensive ROS otherwise negative.  Labs/Other Tests and Data Reviewed:    Recent Labs: No results found for requested labs within last 8760 hours.   Recent Lipid Panel Lab Results  Component Value Date/Time   CHOL 138 05/11/2020  03:58 PM   TRIG 137 05/11/2020 03:58 PM   HDL 48 05/11/2020 03:58 PM   CHOLHDL 2.9 05/11/2020 03:58 PM   CHOLHDL 2.9 06/17/2013 02:03 PM   LDLCALC 66 05/11/2020 03:58 PM    Wt Readings from Last 3 Encounters:  05/15/20 236 lb (107 kg)  02/03/20 247 lb (112 kg)  11/16/19 249 lb 6.4 oz (113.1 kg)     Exam:    Vital Signs:  BP 132/78   Pulse 60   Wt 236 lb (107 kg)   BMI 38.09 kg/m    Exam not performed due to telephone visit  ASSESSMENT & PLAN:    1. Mixed dyslipidemia, goal LDL less than 70 2. Coronary artery disease status post STEMI in 2015, status post PCI to the circumflex 3. Type 2 diabetes 4. Hypertension 5. Morbid obesity  Yvonne Shaw has responded nicely to ezetimibe and now is in a target LDL less than 70.  I would recommend continuing her current medications.  She says she is good to try to work on exercise and diet to lower her cholesterol further.  If her LDL cholesterol is well below 50 we may be able to stop her ezetimibe at some point but for now I would continue both cholesterol medicines.  COVID-19 Education: The signs and symptoms of COVID-19 were discussed with the patient and how to seek care for  testing (follow up with PCP or arrange E-visit).  The importance of social distancing was discussed today.  Patient Risk:   After full review of this patients clinical status, I feel that they are at least moderate risk at this time.  Time:   Today, I have spent 15 minutes with the patient with telehealth technology discussing dyslipidemia, target LDL cholesterol, medication management..     Medication Adjustments/Labs and Tests Ordered: Current medicines are reviewed at length with the patient today.  Concerns regarding medicines are outlined above.   Tests Ordered: Orders Placed This Encounter  Procedures  . Lipid panel    Medication Changes: No orders of the defined types were placed in this encounter.   Disposition:  in 6 month(s)  Pixie Casino, MD, [facility], [place] Director of the Advanced Lipid Disorders &  Cardiovascular Risk Reduction Clinic Diplomate of the American Board of Clinical Lipidology Attending Cardiologist  Direct Dial: [phone]  Fax: [phone]  Website:  [email or web address]  Pixie Casino, MD  05/15/2020 9:46 AM

## 2020-05-15 NOTE — Patient Instructions (Signed)
.  Medication Instructions:  Your physician recommends that you continue on your current medications as directed. Please refer to the Current Medication list given to you today.  *If you need a refill on your cardiac medications before your next appointment, please call your pharmacy*   Lab Work: FASTING lipid panel in 6 months (before next visit) If you have labs (blood work) drawn today and your tests are completely normal, you will receive your results only by: [name] MyChart Message (if you have MyChart) OR . A paper copy in the mail If you have any lab test that is abnormal or we need to change your treatment, we will call you to review the results.   Testing/Procedures: NONE   Follow-Up: At [facility], you and your health needs are our priority.  As part of our continuing mission to provide you with exceptional heart care, we have created designated Provider Care Teams.  These Care Teams include your primary Cardiologist (physician) and Advanced Practice Providers (APPs -  Physician Assistants and Nurse Practitioners) who all work together to provide you with the care you need, when you need it.  We recommend signing up for the patient portal called "MyChart".  Sign up information is provided on this After Visit Summary.  MyChart is used to connect with patients for Virtual Visits (Telemedicine).  Patients are able to view lab/test results, encounter notes, upcoming appointments, etc.  Non-urgent messages can be sent to your provider as well.   To learn more about what you can do with MyChart, go to [email or web address].    Your next appointment:   6 month(s) - LIPID CLINIC  The format for your next appointment:   In Person or Virtual  Provider:   Raliegh Ip Mali Hilty, MD   Other Instructions

## 2020-06-07 NOTE — Progress Notes (Signed)
Telephone, made corrections left over from previous video visit  Dr Lemmie Evens

## 2020-06-13 ENCOUNTER — Telehealth: Payer: Self-pay

## 2020-06-13 NOTE — Telephone Encounter (Signed)
Dr. Gwenlyn Found   Are you ok with this patient holding ASA and Plavix prior to colonoscopy scheduled for 06/27/20?   She has a hx of inferior STEMI s/p PCI and drug-eluting stenting of her AV groove circumflex 03/19/2013. You last saw her 11/2019 and she was doing well with no chest pain.   Please send you recommendations to the preop pool    Thank you

## 2020-06-13 NOTE — Telephone Encounter (Signed)
Okay to hold antiplatelet drugs for colonoscopy.

## 2020-06-13 NOTE — Telephone Encounter (Signed)
   [facility] Pre-operative Risk Assessment    Patient Name: Yvonne Shaw  DOB: [birth year]  MRN: [id number]   HEARTCARE STAFF: - Please ensure there is not already an duplicate clearance open for this procedure. - Under Visit Info/Reason for Call, type in Other and utilize the format Clearance MM/DD/YY or Clearance TBD. Do not use dashes or single digits. - If request is for dental extraction, please clarify the # of teeth to be extracted. - If the patient is currently at the dentist's office, call Pre-Op APP to address. If the patient is not currently in the dentist office, please route to the Pre-Op pool  Request for surgical clearance:  1. What type of surgery is being performed? Colonoscopy  2. When is this surgery scheduled? 06/27/20   3. What type of clearance is required (medical clearance vs. Pharmacy clearance to hold med vs. Both)?  Both  4. Are there any medications that need to be held prior to surgery and how long? ASA   5. Practice name and name of physician performing surgery? [facility], Dr. Bary Castilla  6. What is the office phone number? [phone]   7.   What is the office fax number? [phone]  8.   Anesthesia type (None, local, MAC, general) ? Anesthesia is what is listed on paper    Yvonne Shaw 06/13/2020, 12:44 PM  _________________________________________________________________   (provider comments below)

## 2020-06-13 NOTE — Telephone Encounter (Signed)
    Yvonne Shaw DOB:  08-Nov-1950  MRN:  [id number]   Primary Cardiologist: None  Chart reviewed as part of pre-operative protocol coverage. Given past medical history and time since last visit, based on ACC/AHA guidelines, Yvonne Shaw would be at acceptable risk for the planned procedure without further cardiovascular testing.   Per Dr. Gwenlyn Found, ok to hold ASA prior to procedure and resume thereafter when safe from a bleeding standpoint.   The patient was advised that if she develops new symptoms prior to surgery to contact our office to arrange for a follow-up visit, and she verbalized understanding.  I will route this recommendation to the requesting party via Epic fax function and remove from pre-op pool.  Please call with questions.  Kathyrn Drown, NP 06/13/2020, 4:26 PM

## 2020-06-15 ENCOUNTER — Telehealth: Payer: Self-pay | Admitting: Internal Medicine

## 2020-06-15 NOTE — Telephone Encounter (Signed)
error _________________________________________________________________   (provider comments below)

## 2020-06-15 NOTE — Telephone Encounter (Signed)
   Primary Cardiologist: Quay Burow, MD  Chart reviewed as part of pre-operative protocol coverage. Given past medical history and time since last visit, based on ACC/AHA guidelines, Yvonne Shaw would be at acceptable risk for the planned procedure without further cardiovascular testing.   Per Dr. Gwenlyn Found, ok to hold aspirin and Plavix 5-7 days prior  to procedure and resume thereafter when safe from a bleeding standpoint.   I will route this recommendation to the requesting party via Epic fax function and remove from pre-op pool.  Please call with questions.  Jossie Ng. Alika Eppes NP-C    06/15/2020, 2:51 PM [facility] [address] Office [phone] Fax [phone]

## 2020-06-15 NOTE — Telephone Encounter (Signed)
   [facility] Pre-operative Risk Assessment    Request for surgical clearance:  What type of surgery is being performed? colonscopy   When is this surgery scheduled? 06/27/20   What type of clearance is required (medical clearance vs. Pharmacy clearance to hold med vs. Both)? both  Are there any medications that need to be held prior to surgery and how long?  clopidogrel (PLAVIX) 75 MG tablet  Practice name and name of physician performing surgery? [facility] clinic gastroentergolog dept. Dr Doreatha Lew   What is your office phone number [phone]    7.   What is your office fax number [phone]  8.   Anesthesia type (None, local, MAC, general) ? Anesthesia is what is listed on paper   Milbert Coulter 06/15/2020, 2:34 PM  _________________________________________________________________   (provider comments below)

## 2020-06-26 ENCOUNTER — Encounter: Payer: Self-pay | Admitting: General Surgery

## 2020-06-27 ENCOUNTER — Encounter: Payer: Self-pay | Admitting: General Surgery

## 2020-06-27 ENCOUNTER — Ambulatory Visit: Payer: Medicare HMO | Admitting: Registered Nurse

## 2020-06-27 ENCOUNTER — Encounter
Admission: RE | Disposition: A | Discharge status: Home / Self Care | Payer: Self-pay | Source: Home / Self Care | Attending: General Surgery

## 2020-06-27 ENCOUNTER — Ambulatory Visit
Admission: RE | Admit: 2020-06-27 | Discharge: 2020-06-27 | Disposition: A | Discharge status: Home / Self Care | Payer: Medicare HMO | Attending: General Surgery | Admitting: General Surgery

## 2020-06-27 ENCOUNTER — Other Ambulatory Visit: Payer: Self-pay | Admitting: General Surgery

## 2020-06-27 DIAGNOSIS — Z79899 Other long term (current) drug therapy: Secondary | ICD-10-CM | POA: Diagnosis not present

## 2020-06-27 DIAGNOSIS — I129 Hypertensive chronic kidney disease with stage 1 through stage 4 chronic kidney disease, or unspecified chronic kidney disease: Secondary | ICD-10-CM | POA: Insufficient documentation

## 2020-06-27 DIAGNOSIS — E1122 Type 2 diabetes mellitus with diabetic chronic kidney disease: Secondary | ICD-10-CM | POA: Insufficient documentation

## 2020-06-27 DIAGNOSIS — Z7982 Long term (current) use of aspirin: Secondary | ICD-10-CM | POA: Diagnosis not present

## 2020-06-27 DIAGNOSIS — N189 Chronic kidney disease, unspecified: Secondary | ICD-10-CM | POA: Insufficient documentation

## 2020-06-27 DIAGNOSIS — Z7984 Long term (current) use of oral hypoglycemic drugs: Secondary | ICD-10-CM | POA: Insufficient documentation

## 2020-06-27 DIAGNOSIS — Z886 Allergy status to analgesic agent status: Secondary | ICD-10-CM | POA: Diagnosis not present

## 2020-06-27 DIAGNOSIS — Z539 Procedure and treatment not carried out, unspecified reason: Secondary | ICD-10-CM | POA: Diagnosis not present

## 2020-06-27 DIAGNOSIS — Z1211 Encounter for screening for malignant neoplasm of colon: Secondary | ICD-10-CM | POA: Insufficient documentation

## 2020-06-27 DIAGNOSIS — Z86010 Personal history of colonic polyps: Secondary | ICD-10-CM | POA: Insufficient documentation

## 2020-06-27 DIAGNOSIS — Z955 Presence of coronary angioplasty implant and graft: Secondary | ICD-10-CM | POA: Insufficient documentation

## 2020-06-27 HISTORY — DX: Chronic kidney disease, unspecified: N18.9

## 2020-06-27 HISTORY — DX: Benign neoplasm of connective and other soft tissue, unspecified: D21.9

## 2020-06-27 HISTORY — PX: COLONOSCOPY WITH PROPOFOL: SHX5780

## 2020-06-27 LAB — GLUCOSE, CAPILLARY: Glucose-Capillary: 127 mg/dL — ABNORMAL HIGH (ref 70–99)

## 2020-06-27 SURGERY — COLONOSCOPY WITH PROPOFOL
Anesthesia: General

## 2020-06-27 MED ORDER — PROPOFOL 500 MG/50ML IV EMUL
INTRAVENOUS | Status: DC | PRN
  Administered 2020-06-27: 150 ug/kg/min via INTRAVENOUS

## 2020-06-27 MED ORDER — PROPOFOL 10 MG/ML IV BOLUS
INTRAVENOUS | Status: DC | PRN
  Administered 2020-06-27: 80 mg via INTRAVENOUS

## 2020-06-27 MED ORDER — SODIUM CHLORIDE 0.9 % IV SOLN: INTRAVENOUS | Status: DC

## 2020-06-27 MED ORDER — LIDOCAINE HCL (CARDIAC) PF 100 MG/5ML IV SOSY
PREFILLED_SYRINGE | INTRAVENOUS | Status: DC | PRN
  Administered 2020-06-27: 40 mg via INTRAVENOUS

## 2020-06-27 MED ORDER — PROPOFOL 500 MG/50ML IV EMUL
INTRAVENOUS | Status: AC
  Filled 2020-06-27: qty 50

## 2020-06-27 NOTE — H&P (Signed)
Yvonne Shaw [id number] 01/01/1951     HPI:  Three episodes of passing dark/ maroon stool. Scant associated abdominal pain.  No bleeding noted in over 1 month.  Tolerated prep well. Retired Therapist, sports.   Medications Prior to Admission  Medication Sig Dispense Refill Last Dose   ergocalciferol (VITAMIN D2) 1.25 MG (50000 UT) capsule Take 50,000 Units by mouth once a week.      amLODipine (NORVASC) 10 MG tablet Take 1 tablet by mouth. Twice weekly per patient preference      aspirin 81 MG tablet Take 81 mg by mouth daily.      atorvastatin (LIPITOR) 80 MG tablet Take 1 tablet (80 mg total) by mouth daily at 6 PM. 30 tablet 5    Blood Glucose Monitoring Suppl (ONE TOUCH ULTRA SYSTEM KIT) W/DEVICE KIT 1 kit by Does not apply route once. 1 each 0    Cholecalciferol (VITAMIN D) 50 MCG (2000 UT) CAPS Take 6,000 Units by mouth daily.      clopidogrel (PLAVIX) 75 MG tablet Take 1 tablet (75 mg total) by mouth daily. 90 tablet 3    ezetimibe (ZETIA) 10 MG tablet Take 1 tablet (10 mg total) by mouth daily. 90 tablet 3    GLIPIZIDE XL 5 MG 24 hr tablet Take 1 tablet by mouth daily.  11    hydrochlorothiazide (MICROZIDE) 12.5 MG capsule Take 1 capsule (12.5 mg total) by mouth daily. 30 capsule 6    lisinopril (PRINIVIL,ZESTRIL) 20 MG tablet Take 20 mg by mouth daily.      metoprolol tartrate (LOPRESSOR) 25 MG tablet Take 25 mg by mouth daily.      nitroGLYCERIN (NITROSTAT) 0.4 MG SL tablet Place 1 tablet (0.4 mg total) under the tongue every 5 (five) minutes x 3 doses as needed for chest pain. 25 tablet 2    omeprazole (PRILOSEC) 40 MG capsule Take by mouth. Take 1 capsules by mouth once daily in morning      Allergies  Allergen Reactions   Aggrenox [Aspirin-Dipyridamole Er] Other (See Comments), Nausea Only and Nausea And Vomiting    Severe headache   Carvedilol Other (See Comments)    caused fatigue   Past Medical History:  Diagnosis Date   Abnormal CT scan, colon 05/21/2015   Anginal pain (HCC)     CAD S/P percutaneous coronary angioplasty 03/19/2013   s/p 2.25 mm x 15 mm (2.46) DES to mid AV Groove Circumflex   CKD (chronic kidney disease)    CVA (cerebral infarction)    Residual R leg weakness   Diabetes mellitus type 2 in obese (HCC)    Fibroid    GERD (gastroesophageal reflux disease)    Hyperlipidemia LDL goal <70    Hypertension    Nonsustained ventricular tachycardia ([place])    found on exam monitoring, asymptomatic   Obesity    Orthopnea    PVC's (premature ventricular contractions)    seen on telemetry during cardiac rehabilitation   Shortness of breath dyspnea    due to brilinta   ST elevation myocardial infarction (STEMI) of inferolateral wall, subsequent episode of care ([place]) 03/19/2013   s/p DES to mid AV Groove Circumflex; EF 50% on cath   Stroke [facility])    2001   Past Surgical History:  Procedure Laterality Date   ABDOMINAL HYSTERECTOMY     CATARACT EXTRACTION W/PHACO Right 09/05/2014   Procedure: CATARACT EXTRACTION PHACO AND INTRAOCULAR LENS PLACEMENT ([place]);  Surgeon: Birder Robson, MD;  Location: [facility] ORS;  Service:  Ophthalmology;  Laterality: Right;  [place]: 01:12.2 AP%: 20.1 CDE: 14.45 Lot # [id number]   CHOLECYSTECTOMY     COLONOSCOPY WITH PROPOFOL N/A 07/02/2015   Procedure: COLONOSCOPY WITH PROPOFOL;  Surgeon: Manya Silvas, MD;  Location: [facility] ENDOSCOPY;  Service: Endoscopy;  Laterality: N/A;   LEFT HEART CATHETERIZATION WITH CORONARY ANGIOGRAM N/A 03/19/2013   Procedure: LEFT HEART CATHETERIZATION WITH CORONARY ANGIOGRAM;  Surgeon: Lorretta Harp, MD;  Location: [facility] CATH LAB;  Service: Cardiovascular;  Laterality: N/A;   PERCUTANEOUS CORONARY STENT INTERVENTION (PCI-S)  03/19/2013   PCI to mCx - 90% lesion: 2.25 mm x 15 mm Xience [place] DES (2.46 mm post-dilation); EF ~50%   Social History   Socioeconomic History   Marital status: Married    Spouse name: Not on file   Number of children: Not on file   Years of education: Not on file   Highest  education level: Not on file  Occupational History   Not on file  Tobacco Use   Smoking status: Never   Smokeless tobacco: Never  Vaping Use   Vaping Use: Never used  Substance and Sexual Activity   Alcohol use: No   Drug use: No   Sexual activity: Not on file  Other Topics Concern   Not on file  Social History Narrative   Married, 2 healthy children. Lives in [place], [place]. Works at [employer].   Social Determinants of Health   Financial Resource Strain: Not on file  Food Insecurity: Not on file  Transportation Needs: Not on file  Physical Activity: Not on file  Stress: Not on file  Social Connections: Not on file  Intimate Partner Violence: Not on file   Social History   Social History Narrative   Married, 2 healthy children. Lives in [place], [place]. Works at [employer].     ROS: Negative.     PE: HEENT: Negative. Lungs: Clear. Cardio: RR.  Assessment/Plan:  Proceed with planned endoscopy.    Yvonne Shaw 06/27/2020   Assessment/Plan:  Proceed with planned endoscopy.

## 2020-06-27 NOTE — Anesthesia Procedure Notes (Signed)
Date/Time: 06/27/2020 8:18 AM Performed by: Doreen Salvage, CRNA Pre-anesthesia Checklist: Patient identified, Emergency Drugs available, Suction available and Patient being monitored Patient Re-evaluated:Patient Re-evaluated prior to induction Oxygen Delivery Method: Nasal cannula Induction Type: IV induction Dental Injury: Teeth and Oropharynx as per pre-operative assessment  Comments: Nasal cannula with etCO2 monitoring

## 2020-06-27 NOTE — Anesthesia Preprocedure Evaluation (Signed)
Anesthesia Evaluation  Patient identified by MRN, date of birth, ID band Patient awake    Reviewed: Allergy & Precautions, NPO status , Patient's Chart, lab work & pertinent test results  History of Anesthesia Complications Negative for: history of anesthetic complications  Airway Mallampati: II  TM Distance: >3 FB Neck ROM: Full    Dental  (+) Implants   Pulmonary neg sleep apnea, neg COPD,    breath sounds clear to auscultation- rhonchi (-) wheezing      Cardiovascular hypertension, Pt. on medications + CAD, + Past MI and + Cardiac Stents  (-) CABG  Rhythm:Regular Rate:Normal - Systolic murmurs and - Diastolic murmurs    Neuro/Psych neg Seizures CVA, No Residual Symptoms negative psych ROS   GI/Hepatic Neg liver ROS, GERD  ,  Endo/Other  diabetes, Oral Hypoglycemic Agents  Renal/GU CRFRenal disease     Musculoskeletal negative musculoskeletal ROS (+)   Abdominal (+) + obese,   Peds  Hematology negative hematology ROS (+)   Anesthesia Other Findings Past Medical History: 05/21/2015: Abnormal CT scan, colon No date: Anginal pain ([place]) 03/19/2013: CAD S/P percutaneous coronary angioplasty     Comment:  s/p 2.25 mm x 15 mm (2.46) DES to mid AV Groove               Circumflex No date: CKD (chronic kidney disease) No date: CVA (cerebral infarction)     Comment:  Residual R leg weakness No date: Diabetes mellitus type 2 in obese (HCC) No date: Fibroid No date: GERD (gastroesophageal reflux disease) No date: Hyperlipidemia LDL goal <70 No date: Hypertension No date: Nonsustained ventricular tachycardia (HCC)     Comment:  found on exam monitoring, asymptomatic No date: Obesity No date: Orthopnea No date: PVC's (premature ventricular contractions)     Comment:  seen on telemetry during cardiac rehabilitation No date: Shortness of breath dyspnea     Comment:  due to brilinta 03/19/2013: ST elevation myocardial  infarction (STEMI) of  inferolateral wall, subsequent episode of care [facility])     Comment:  s/p DES to mid AV Groove Circumflex; EF 50% on cath No date: Stroke [facility])     Comment:  2001   Reproductive/Obstetrics                             Anesthesia Physical Anesthesia Plan  ASA: 3  Anesthesia Plan: General   Post-op Pain Management:    Induction: Intravenous  PONV Risk Score and Plan: 2 and Propofol infusion  Airway Management Planned: Natural Airway  Additional Equipment:   Intra-op Plan:   Post-operative Plan:   Informed Consent: I have reviewed the patients History and Physical, chart, labs and discussed the procedure including the risks, benefits and alternatives for the proposed anesthesia with the patient or authorized representative who has indicated his/her understanding and acceptance.     Dental advisory given  Plan Discussed with: CRNA and Anesthesiologist  Anesthesia Plan Comments:         Anesthesia Quick Evaluation

## 2020-06-27 NOTE — Op Note (Addendum)
[facility] Gastroenterology Patient Name: Yvonne Shaw Procedure Date: 06/27/2020 7:58 AM MRN: [id number] Account #: [id number] Date of Birth: 1950/04/16 Admit Type: Outpatient Age: 70 Room: [facility] ENDO ROOM 1 Gender: Female Note Status: Supervisor Override Procedure:             Colonoscopy Indications:           High risk colon cancer surveillance: Personal history                         of colonic polyps, Rectal bleeding Providers:             Robert Bellow, MD Referring MD:          Theodosia Blender (Referring MD) Medicines:             Propofol per Anesthesia Complications:         No immediate complications. Procedure:             Pre-Anesthesia Assessment:                        - Prior to the procedure, a History and Physical was                         performed, and patient medications, allergies and                         sensitivities were reviewed. The patient's tolerance                         of previous anesthesia was reviewed.                        - The risks and benefits of the procedure and the                         sedation options and risks were discussed with the                         patient. All questions were answered and informed                         consent was obtained.                        After obtaining informed consent, the colonoscope was                         passed under direct vision. Throughout the procedure,                         the patient's blood pressure, pulse, and oxygen                         saturations were monitored continuously. The                         Colonoscope was introduced through the anus with the                         intention of advancing  to the hepatic flexure. The                         scope was advanced to the sigmoid colon before the                         procedure was aborted. Medications were given. The                         colonoscopy was extremely difficult due to  restricted                         mobility of the colon. Successful completion of the                         procedure was aided by changing the patient to a prone                         position. The patient tolerated the procedure well.                         The quality of the bowel preparation was good. Findings:      The retroflexed view of the distal rectum and anal verge was normal and       showed no anal or rectal abnormalities. Impression:            - The distal rectum and anal verge are normal on                         retroflexion view.                        - No specimens collected. Recommendation:        - Perform a virtual colonoscopy at appointment to be                         scheduled. Procedure Code(s):     --- Professional ---                        [phone], 46, Colonoscopy, flexible; diagnostic,                         including collection of specimen(s) by brushing or                         washing, when performed (separate procedure) Diagnosis Code(s):     --- Professional ---                        Z86.010, Personal history of colonic polyps CPT copyright 2019 American Medical Association. All rights reserved. The codes documented in this report are preliminary and upon coder review may  be revised to meet current compliance requirements. Robert Bellow, MD 06/27/2020 8:46:21 AM This report has been signed electronically. Number of Addenda: 0 Note Initiated On: 06/27/2020 7:58 AM Total Procedure Duration: 0 hours 22 minutes 25 seconds       [facility]

## 2020-06-27 NOTE — Transfer of Care (Signed)
Immediate Anesthesia Transfer of Care Note  Patient: Yvonne Shaw  Procedure(s) Performed: COLONOSCOPY WITH PROPOFOL  Patient Location: PACU  Anesthesia Type:General  Level of Consciousness: drowsy  Airway & Oxygen Therapy: Patient Spontanous Breathing  Post-op Assessment: Report given to RN and Post -op Vital signs reviewed and stable  Post vital signs: Reviewed and stable  Last Vitals:  Vitals Value Taken Time  BP 110/65 06/27/20 0849  Temp 35.7 C 06/27/20 0849  Pulse 69 06/27/20 0849  Resp 21 06/27/20 0849  SpO2 98 % 06/27/20 0849  Vitals shown include unvalidated device data.  Last Pain:  Vitals:   06/27/20 0849  TempSrc: Temporal  PainSc:          Complications: No notable events documented.

## 2020-06-27 NOTE — Anesthesia Postprocedure Evaluation (Signed)
Anesthesia Post Note  Patient: Yvonne Shaw  Procedure(s) Performed: COLONOSCOPY WITH PROPOFOL  Patient location during evaluation: Endoscopy Anesthesia Type: General Level of consciousness: awake and alert and oriented Pain management: pain level controlled Vital Signs Assessment: post-procedure vital signs reviewed and stable Respiratory status: spontaneous breathing, nonlabored ventilation and respiratory function stable Cardiovascular status: blood pressure returned to baseline and stable Postop Assessment: no signs of nausea or vomiting Anesthetic complications: no   No notable events documented.   Last Vitals:  Vitals:   06/27/20 0910 06/27/20 0919  BP: (!) 146/76 (!) 146/89  Pulse:    Resp: 15 12  Temp:    SpO2:      Last Pain:  Vitals:   06/27/20 0849  TempSrc: Temporal  PainSc:                  Finnbar Cedillos

## 2020-06-28 ENCOUNTER — Encounter: Payer: Self-pay | Admitting: General Surgery

## 2020-07-26 ENCOUNTER — Ambulatory Visit
Admit: 2020-07-26 | Discharge: 2020-07-26 | Disposition: A | Discharge status: Home / Self Care | Payer: Medicare HMO | Attending: General Surgery | Admitting: General Surgery

## 2020-07-26 ENCOUNTER — Other Ambulatory Visit: Payer: Self-pay

## 2020-07-26 DIAGNOSIS — Z1211 Encounter for screening for malignant neoplasm of colon: Secondary | ICD-10-CM

## 2020-10-16 ENCOUNTER — Other Ambulatory Visit: Payer: Self-pay | Admitting: *Deleted

## 2020-10-16 DIAGNOSIS — E785 Hyperlipidemia, unspecified: Secondary | ICD-10-CM

## 2020-10-17 ENCOUNTER — Telehealth: Payer: Self-pay | Admitting: Internal Medicine

## 2020-10-17 NOTE — Telephone Encounter (Signed)
Patient requesting to have her labs done at a [facility] in [place]. She requests the lab orders be released so they will have them.

## 2020-10-17 NOTE — Telephone Encounter (Signed)
Left message for patient that lipid panel has been ordered for [facility], can be done at any location

## 2020-11-13 ENCOUNTER — Other Ambulatory Visit: Payer: Self-pay

## 2020-11-13 ENCOUNTER — Ambulatory Visit: Payer: Medicare HMO | Admitting: Cardiovascular Disease

## 2020-11-13 ENCOUNTER — Encounter: Payer: Self-pay | Admitting: Cardiovascular Disease

## 2020-11-13 VITALS — BP 120/70 | HR 61 | Ht 66.0 in | Wt 222.4 lb

## 2020-11-13 DIAGNOSIS — I2510 Atherosclerotic heart disease of native coronary artery without angina pectoris: Secondary | ICD-10-CM

## 2020-11-13 DIAGNOSIS — E785 Hyperlipidemia, unspecified: Secondary | ICD-10-CM

## 2020-11-13 DIAGNOSIS — I10 Essential (primary) hypertension: Secondary | ICD-10-CM

## 2020-11-13 DIAGNOSIS — I2119 ST elevation (STEMI) myocardial infarction involving other coronary artery of inferior wall: Secondary | ICD-10-CM | POA: Diagnosis not present

## 2020-11-13 NOTE — Progress Notes (Signed)
11/13/2020 Yvonne Shaw   [birth year]  [id number]  Primary Physician D, Ples Specter, FNP Primary Cardiologist: Lorretta Harp MD Garret Reddish, [place], [place]  HPI:  Yvonne Shaw is a 70 y.o.  Married [place] American female mother of 2, grandmother and 6 grandchildren he worked as a Marine scientist at [employer], I retired in 2020.. I last saw her in the office 11/16/2019.Marland KitchenShe had no prior cardiac history, PMHx s/f h/o CVA, HTN, HLD and obesity who was admitted to [facility] on 03/19/13 with an inferior STEMI. She reports being in her USOH until ~ 9PM last night when she began experiencing intermittent substernal chest burning w/o radiation or associated symptoms. She was not initially concerned and attributed this to indigestion. She went to sleep and was awoken at 6AM by the same discomfort which had increased to a 9/10 in intensity and with associated nausea. She got up and tried to busy herself around the house, however the discomfort persisted. She alerted her husband who called 911. Serial EKG tracings in the field reveal fluctuating ST segments (62mm elevation to isoelectric) in leads III, aVF. She was transported initially to [facility] ED. Code STEMI was called. She did receive ASA x 4 and heparin bolus. She was diverted to [facility] ED for emergent cardiac catheterization. 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 She was just been discharged 2 days later. Her only problems have been episodic shortness of breath probably related to the [place].she did have an episode of chest pain in May underwent Myoview stress testing at [facility] which was nonischemic. While performing cardiac rehabilitation care it was noted that she had frequent PVCs on telemetry although she was unaware of this.patient wore a 30 day event monitor which did show some short runs of nonsustained ventricular tachycardia. Since I saw her one year ago she has  remained stable. Her Brilenta was changed to Plavix because of shortness of breath .   Since I saw her in the office 1 year ago she continues to do well.  She denies chest pain or shortness of breath.  I did refer her to Dr. Debara Pickett for management of her hyperlipidemia.  He had an Zetia on top of her high-dose atorvastatin which resulted in marked improvement in her lipid profile.     Current Meds  Medication Sig   amLODipine (NORVASC) 10 MG tablet Take 1 tablet by mouth daily.   aspirin 81 MG tablet Take 81 mg by mouth daily.   atorvastatin (LIPITOR) 80 MG tablet Take 1 tablet (80 mg total) by mouth daily at 6 PM.   Blood Glucose Monitoring Suppl (ONE TOUCH ULTRA SYSTEM KIT) W/DEVICE KIT 1 kit by Does not apply route once.   Cholecalciferol (VITAMIN D) 50 MCG (2000 UT) CAPS Take 6,000 Units by mouth daily.   clopidogrel (PLAVIX) 75 MG tablet Take 1 tablet (75 mg total) by mouth daily.   ezetimibe (ZETIA) 10 MG tablet Take 1 tablet (10 mg total) by mouth daily.   GLIPIZIDE XL 5 MG 24 hr tablet Take 1 tablet by mouth daily.   hydrochlorothiazide (MICROZIDE) 12.5 MG capsule Take 1 capsule (12.5 mg total) by mouth daily.   lisinopril (PRINIVIL,ZESTRIL) 20 MG tablet Take 20 mg by mouth daily.   metoprolol tartrate (LOPRESSOR) 25 MG tablet Take 25 mg by mouth daily.   nitroGLYCERIN (NITROSTAT) 0.4 MG SL tablet Place 1 tablet (0.4 mg total) under the  tongue every 5 (five) minutes x 3 doses as needed for chest pain.     Allergies  Allergen Reactions   Aggrenox [Aspirin-Dipyridamole Er] Other (See Comments), Nausea Only and Nausea And Vomiting    Severe headache   Carvedilol Other (See Comments)    caused fatigue    Social History   Socioeconomic History   Marital status: Married    Spouse name: Not on file   Number of children: Not on file   Years of education: Not on file   Highest education level: Not on file  Occupational History   Not on file  Tobacco Use   Smoking status: Never    Smokeless tobacco: Never  Vaping Use   Vaping Use: Never used  Substance and Sexual Activity   Alcohol use: No   Drug use: No   Sexual activity: Not on file  Other Topics Concern   Not on file  Social History Narrative   Married, 2 healthy children. Lives in [place], [place]. Works at [employer].   Social Determinants of Health   Financial Resource Strain: Not on file  Food Insecurity: Not on file  Transportation Needs: Not on file  Physical Activity: Not on file  Stress: Not on file  Social Connections: Not on file  Intimate Partner Violence: Not on file     Review of Systems: General: negative for chills, fever, night sweats or weight changes.  Cardiovascular: negative for chest pain, dyspnea on exertion, edema, orthopnea, palpitations, paroxysmal nocturnal dyspnea or shortness of breath Dermatological: negative for rash Respiratory: negative for cough or wheezing Urologic: negative for hematuria Abdominal: negative for nausea, vomiting, diarrhea, bright red blood per rectum, melena, or hematemesis Neurologic: negative for visual changes, syncope, or dizziness All other systems reviewed and are otherwise negative except as noted above.    Blood pressure (!) 160/86, pulse 61, height 5\' 6"  (1.676 m), weight 222 lb 6.4 oz (100.9 kg), SpO2 99 %.  General appearance: alert and no distress Neck: no adenopathy, no carotid bruit, no JVD, supple, symmetrical, trachea midline, and thyroid not enlarged, symmetric, no tenderness/mass/nodules Lungs: clear to auscultation bilaterally Heart: regular rate and rhythm, S1, S2 normal, no murmur, click, rub or gallop Extremities: extremities normal, atraumatic, no cyanosis or edema Pulses: 2+ and symmetric Skin: Skin color, texture, turgor normal. No rashes or lesions Neurologic: Grossly normal  EKG sinus rhythm at 61 without ST or T wave changes.  Personally reviewed this EKG.  ASSESSMENT AND PLAN:   ST elevation myocardial infarction (STEMI)  of inferior wall [facility]) History of inferior STEMI 03/19/2013 when she presented to [facility] and was transferred to [facility] .  Catheterization performed by myself revealed a high-grade mid AV groove circumflex stenosis which I stented with a drug-eluting stent.  She had no other significant disease.  Her last 2D echocardiogram performed 09/01/2013 revealed normal LV systolic function.  She denies chest pain or shortness of breath.  HTN (hypertension) History of essential hypertension a blood pressure measured today in the office of 160/86.  Usually it is much lower than this.  She is on amlodipine, hydrochlorothiazide, lisinopril and metoprolol.  Dyslipidemia, goal LDL below 70 .  Her mostHistory of hyperlipidemia on atorvastatin and Zetia.  I had referred her to Dr. 09/03/2013 in lipid clinic for further evaluation and treatment of this recent lipid profile performed 05/11/2020 revealed total cholesterol 138, LDL 66 and HDL 48.     07/11/2020 MD FACP,FACC,FAHA, [facility] 11/13/2020 10:32 AM

## 2020-11-13 NOTE — Assessment & Plan Note (Signed)
History of essential hypertension a blood pressure measured today in the office of 160/86.  Usually it is much lower than this.  She is on amlodipine, hydrochlorothiazide, lisinopril and metoprolol.

## 2020-11-13 NOTE — Assessment & Plan Note (Signed)
.    Her mostHistory of hyperlipidemia on atorvastatin and Zetia.  I had referred her to Dr. Debara Pickett in lipid clinic for further evaluation and treatment of this recent lipid profile performed 05/11/2020 revealed total cholesterol 138, LDL 66 and HDL 48.

## 2020-11-13 NOTE — Assessment & Plan Note (Signed)
History of inferior STEMI 03/19/2013 when she presented to [facility] and was transferred to [facility] .  Catheterization performed by myself revealed a high-grade mid AV groove circumflex stenosis which I stented with a drug-eluting stent.  She had no other significant disease.  Her last 2D echocardiogram performed 09/01/2013 revealed normal LV systolic function.  She denies chest pain or shortness of breath.

## 2020-11-13 NOTE — Patient Instructions (Signed)

## 2020-12-26 ENCOUNTER — Inpatient Hospital Stay
Admission: EM | Admit: 2020-12-26 | Discharge: 2020-12-28 | DRG: 247 | Disposition: A | Discharge status: Home / Self Care | Payer: Medicare HMO | Attending: Hospitalist | Admitting: Hospitalist

## 2020-12-26 ENCOUNTER — Other Ambulatory Visit: Payer: Self-pay

## 2020-12-26 ENCOUNTER — Encounter: Payer: Self-pay | Admitting: Emergency Medicine

## 2020-12-26 ENCOUNTER — Emergency Department: Payer: Medicare HMO

## 2020-12-26 ENCOUNTER — Inpatient Hospital Stay: Payer: Medicare HMO

## 2020-12-26 DIAGNOSIS — Z833 Family history of diabetes mellitus: Secondary | ICD-10-CM | POA: Diagnosis not present

## 2020-12-26 DIAGNOSIS — Z20822 Contact with and (suspected) exposure to covid-19: Secondary | ICD-10-CM | POA: Diagnosis present

## 2020-12-26 DIAGNOSIS — I214 Non-ST elevation (NSTEMI) myocardial infarction: Secondary | ICD-10-CM | POA: Diagnosis present

## 2020-12-26 DIAGNOSIS — Z79899 Other long term (current) drug therapy: Secondary | ICD-10-CM

## 2020-12-26 DIAGNOSIS — Z955 Presence of coronary angioplasty implant and graft: Secondary | ICD-10-CM | POA: Diagnosis not present

## 2020-12-26 DIAGNOSIS — Z7982 Long term (current) use of aspirin: Secondary | ICD-10-CM

## 2020-12-26 DIAGNOSIS — I129 Hypertensive chronic kidney disease with stage 1 through stage 4 chronic kidney disease, or unspecified chronic kidney disease: Secondary | ICD-10-CM | POA: Diagnosis present

## 2020-12-26 DIAGNOSIS — I2510 Atherosclerotic heart disease of native coronary artery without angina pectoris: Secondary | ICD-10-CM | POA: Diagnosis present

## 2020-12-26 DIAGNOSIS — Z8673 Personal history of transient ischemic attack (TIA), and cerebral infarction without residual deficits: Secondary | ICD-10-CM | POA: Diagnosis not present

## 2020-12-26 DIAGNOSIS — I200 Unstable angina: Secondary | ICD-10-CM | POA: Diagnosis not present

## 2020-12-26 DIAGNOSIS — I252 Old myocardial infarction: Secondary | ICD-10-CM | POA: Diagnosis not present

## 2020-12-26 DIAGNOSIS — Z9841 Cataract extraction status, right eye: Secondary | ICD-10-CM

## 2020-12-26 DIAGNOSIS — R079 Chest pain, unspecified: Secondary | ICD-10-CM | POA: Diagnosis present

## 2020-12-26 DIAGNOSIS — N1831 Chronic kidney disease, stage 3a: Secondary | ICD-10-CM | POA: Diagnosis present

## 2020-12-26 DIAGNOSIS — Z8249 Family history of ischemic heart disease and other diseases of the circulatory system: Secondary | ICD-10-CM

## 2020-12-26 DIAGNOSIS — Z888 Allergy status to other drugs, medicaments and biological substances status: Secondary | ICD-10-CM | POA: Diagnosis not present

## 2020-12-26 DIAGNOSIS — E119 Type 2 diabetes mellitus without complications: Secondary | ICD-10-CM

## 2020-12-26 DIAGNOSIS — E1122 Type 2 diabetes mellitus with diabetic chronic kidney disease: Secondary | ICD-10-CM | POA: Diagnosis present

## 2020-12-26 DIAGNOSIS — I10 Essential (primary) hypertension: Secondary | ICD-10-CM | POA: Diagnosis not present

## 2020-12-26 DIAGNOSIS — Z961 Presence of intraocular lens: Secondary | ICD-10-CM | POA: Diagnosis present

## 2020-12-26 DIAGNOSIS — K219 Gastro-esophageal reflux disease without esophagitis: Secondary | ICD-10-CM | POA: Diagnosis present

## 2020-12-26 DIAGNOSIS — E669 Obesity, unspecified: Secondary | ICD-10-CM | POA: Diagnosis present

## 2020-12-26 DIAGNOSIS — Z7902 Long term (current) use of antithrombotics/antiplatelets: Secondary | ICD-10-CM

## 2020-12-26 DIAGNOSIS — E785 Hyperlipidemia, unspecified: Secondary | ICD-10-CM | POA: Diagnosis present

## 2020-12-26 DIAGNOSIS — E1159 Type 2 diabetes mellitus with other circulatory complications: Secondary | ICD-10-CM

## 2020-12-26 DIAGNOSIS — E782 Mixed hyperlipidemia: Secondary | ICD-10-CM | POA: Diagnosis not present

## 2020-12-26 LAB — CBC
HCT: 38.7 % (ref 36.0–46.0)
Hemoglobin: 12.8 g/dL (ref 12.0–15.0)
MCH: 27.4 pg (ref 26.0–34.0)
MCHC: 33.1 g/dL (ref 30.0–36.0)
MCV: 82.9 fL (ref 80.0–100.0)
Platelets: 241 10*3/uL (ref 150–400)
RBC: 4.67 MIL/uL (ref 3.87–5.11)
RDW: 14.6 % (ref 11.5–15.5)
WBC: 9.3 10*3/uL (ref 4.0–10.5)
nRBC: 0 % (ref 0.0–0.2)

## 2020-12-26 LAB — BASIC METABOLIC PANEL
Anion gap: 5 (ref 5–15)
BUN: 22 mg/dL (ref 8–23)
CO2: 27 mmol/L (ref 22–32)
Calcium: 9.1 mg/dL (ref 8.9–10.3)
Chloride: 110 mmol/L (ref 98–111)
Creatinine, Ser: 1.18 mg/dL — ABNORMAL HIGH (ref 0.44–1.00)
GFR, Estimated: 50 mL/min — ABNORMAL LOW (ref >60)
Glucose, Bld: 80 mg/dL (ref 70–99)
Potassium: 3.5 mmol/L (ref 3.5–5.1)
Sodium: 142 mmol/L (ref 135–145)

## 2020-12-26 LAB — TROPONIN I (HIGH SENSITIVITY)
Troponin I (High Sensitivity): 97 ng/L — ABNORMAL HIGH (ref <18)
Troponin I (High Sensitivity): 400 ng/L (ref <18)

## 2020-12-26 LAB — APTT: aPTT: 31 seconds (ref 24–36)

## 2020-12-26 LAB — RESP PANEL BY RT-PCR (FLU A&B, COVID) ARPGX2
Influenza A by PCR: NEGATIVE
Influenza B by PCR: NEGATIVE
SARS Coronavirus 2 by RT PCR: NEGATIVE

## 2020-12-26 LAB — PROTIME-INR
INR: 1 (ref 0.8–1.2)
Prothrombin Time: 13 seconds (ref 11.4–15.2)

## 2020-12-26 MED ORDER — IOHEXOL 350 MG/ML SOLN
75.0000 mL | Freq: Once | INTRAVENOUS | Status: AC | PRN
  Administered 2020-12-26: 75 mL via INTRAVENOUS

## 2020-12-26 MED ORDER — ATORVASTATIN CALCIUM 20 MG PO TABS
80.0000 mg | ORAL_TABLET | Freq: Every day | ORAL | Status: DC
  Administered 2020-12-27: 18:00:00 80 mg via ORAL
  Filled 2020-12-26: qty 4

## 2020-12-26 MED ORDER — NITROGLYCERIN 0.4 MG SL SUBL: 0.4000 mg | SUBLINGUAL_TABLET | SUBLINGUAL | Status: DC | PRN

## 2020-12-26 MED ORDER — ONDANSETRON HCL 4 MG/2ML IJ SOLN: 4.0000 mg | Freq: Four times a day (QID) | INTRAMUSCULAR | Status: DC | PRN

## 2020-12-26 MED ORDER — MORPHINE SULFATE (PF) 2 MG/ML IV SOLN
2.0000 mg | Freq: Once | INTRAVENOUS | Status: AC
  Administered 2020-12-27: 01:00:00 2 mg via INTRAVENOUS
  Filled 2020-12-26: qty 1

## 2020-12-26 MED ORDER — METOPROLOL TARTRATE 25 MG PO TABS: 25.0000 mg | ORAL_TABLET | Freq: Every day | ORAL | Status: DC

## 2020-12-26 MED ORDER — HEPARIN (PORCINE) 25000 UT/250ML-% IV SOLN
1000.0000 [IU]/h | INTRAVENOUS | Status: DC
  Administered 2020-12-27: 01:00:00 1000 [IU]/h via INTRAVENOUS
  Filled 2020-12-26: qty 250

## 2020-12-26 MED ORDER — AMLODIPINE BESYLATE 5 MG PO TABS
10.0000 mg | ORAL_TABLET | Freq: Every day | ORAL | Status: DC
  Administered 2020-12-27: 10:00:00 10 mg via ORAL
  Filled 2020-12-26: qty 2

## 2020-12-26 MED ORDER — CLOPIDOGREL BISULFATE 75 MG PO TABS
75.0000 mg | ORAL_TABLET | Freq: Every day | ORAL | Status: DC
  Administered 2020-12-27: 10:00:00 75 mg via ORAL
  Filled 2020-12-26: qty 1

## 2020-12-26 MED ORDER — LISINOPRIL 10 MG PO TABS
20.0000 mg | ORAL_TABLET | Freq: Every day | ORAL | Status: DC
  Administered 2020-12-27: 10:00:00 20 mg via ORAL
  Filled 2020-12-26: qty 2

## 2020-12-26 MED ORDER — ACETAMINOPHEN 325 MG PO TABS: 650.0000 mg | ORAL_TABLET | ORAL | Status: DC | PRN

## 2020-12-26 MED ORDER — ONDANSETRON HCL 4 MG/2ML IJ SOLN
4.0000 mg | Freq: Once | INTRAMUSCULAR | Status: AC
  Administered 2020-12-27: 01:00:00 4 mg via INTRAVENOUS
  Filled 2020-12-26: qty 2

## 2020-12-26 MED ORDER — HEPARIN BOLUS VIA INFUSION
4000.0000 [IU] | Freq: Once | INTRAVENOUS | Status: AC
  Administered 2020-12-27: 01:00:00 4000 [IU] via INTRAVENOUS
  Filled 2020-12-26: qty 4000

## 2020-12-26 NOTE — H&P (Signed)
History and Physical    KINSLEI LABINE BHA:[id number] DOB: [birth year] DOA: 12/26/2020  PCP: System, Provider Not In    Patient coming from:  Home    Chief Complaint:  Chest pain    HPI:  Yvonne Shaw is a 70 y.o. female seen in ed with complaints of chest pain since 5:30 pm  and pt had taken nitroglycerine.  Patient has a history of NSTEMI in 2015 when she had a stent and her last cardiology appointment was 5 December currently patient in triage did not have any chest pains but when it happened she had shortness of breath but no associated symptoms. C/p late 30 min to 1 hour. No nausea or diaphoresis. When this happened pt was sitting in her car.   Duration:30 minutes- 1 hour.  Frequency: Constant and then intermittent.  Location:middle and center of chest.   Quality:sharp   Rate:10/10   Radiation:NR   Aggravating:None   Alleviating:None   Associated factors:sob +, No nausea or vomiting,  Pt has past medical history of CAD, CKD, CVA, GERD, PVCs, history of stroke, diabetes mellitus type 2. ED Course:  Vitals:   12/26/20 1949 12/26/20 1950 12/26/20 2149 12/26/20 2200  BP: (!) 180/84  (!) 173/70 (!) 171/59  Pulse: 64  (!) 53 (!) 44  Resp: _0 Temp: 98 F (36.7 C)     TempSrc: Oral     SpO2: 100%  99% 98%  Weight:  100.7 kg    Height:  _1  (1.676 m)     In the emergency room patient is alert awake oxygenating 100% on room air and blood pressure is elevated.  Initial EKG shows sinus rhythm 62 with narrow QRS normal axis and no ST-T wave changes.labs shows elevated troponin os 400 and 97 initial. Cta chest pending.   Review of Systems:  Review of Systems  Respiratory:  Positive for shortness of breath.   Cardiovascular:  Positive for chest pain.  Neurological:  Positive for headaches.  All other systems reviewed and are negative.   Past Medical History:  Diagnosis Date   Abnormal CT scan, colon 05/21/2015   Anginal pain (HCC)    CAD S/P  percutaneous coronary angioplasty 03/19/2013   s/p 2.25 mm x 15 mm (2.46) DES to mid AV Groove Circumflex   CKD (chronic kidney disease)    CVA (cerebral infarction)    Residual R leg weakness   Diabetes mellitus type 2 in obese (HCC)    Fibroid    GERD (gastroesophageal reflux disease)    Hyperlipidemia LDL goal <70    Hypertension    Nonsustained ventricular tachycardia    found on exam monitoring, asymptomatic   Obesity    Orthopnea    PVC's (premature ventricular contractions)    seen on telemetry during cardiac rehabilitation   Shortness of breath dyspnea    due to brilinta   ST elevation myocardial infarction (STEMI) of inferolateral wall, subsequent episode of care ([place]) 03/19/2013   s/p DES to mid AV Groove Circumflex; EF 50% on cath   Stroke [facility])    2001    Past Surgical History:  Procedure Laterality Date   ABDOMINAL HYSTERECTOMY     CATARACT EXTRACTION W/PHACO Right 09/05/2014   Procedure: CATARACT EXTRACTION PHACO AND INTRAOCULAR LENS PLACEMENT ([place]);  Surgeon: Birder Robson, MD;  Location: [facility] ORS;  Service: Ophthalmology;  Laterality: Right;  [place]: 01:12.2 AP%: 20.1 CDE: 14.45 Lot # [id number]   CHOLECYSTECTOMY  COLONOSCOPY WITH PROPOFOL N/A 07/02/2015   Procedure: COLONOSCOPY WITH PROPOFOL;  Surgeon: Manya Silvas, MD;  Location: [facility] ENDOSCOPY;  Service: Endoscopy;  Laterality: N/A;   COLONOSCOPY WITH PROPOFOL N/A 06/27/2020   Procedure: COLONOSCOPY WITH PROPOFOL;  Surgeon: Robert Bellow, MD;  Location: [facility] ENDOSCOPY;  Service: Endoscopy;  Laterality: N/A;  DM, TAKES PLAVIX   LEFT HEART CATHETERIZATION WITH CORONARY ANGIOGRAM N/A 03/19/2013   Procedure: LEFT HEART CATHETERIZATION WITH CORONARY ANGIOGRAM;  Surgeon: Lorretta Harp, MD;  Location: [facility] CATH LAB;  Service: Cardiovascular;  Laterality: N/A;   PERCUTANEOUS CORONARY STENT INTERVENTION (PCI-S)  03/19/2013   PCI to mCx - 90% lesion: 2.25 mm x 15 mm Xience Alpine DES (2.46 mm post-dilation); EF  ~50%     reports that she has never smoked. She has never used smokeless tobacco. She reports that she does not drink alcohol and does not use drugs.  Allergies  Allergen Reactions   Aggrenox [Aspirin-Dipyridamole Er] Other (See Comments), Nausea Only and Nausea And Vomiting    Severe headache   Carvedilol Other (See Comments)    caused fatigue    Family History  Problem Relation Age of Onset   Cancer Mother    Diabetes Mother    Hypertension Mother    Hypertension Father    Cancer Father    Diabetes Father    Hypertension Sister    Diabetes Sister    Heart failure Brother    Diabetes Brother    Heart attack Maternal Grandfather    Breast cancer Neg Hx     Prior to Admission medications   Medication Sig Start Date End Date Taking? Authorizing Provider  amLODipine (NORVASC) 10 MG tablet Take 1 tablet by mouth daily. 09/15/18  Yes [provider]  aspirin 81 MG tablet Take 81 mg by mouth daily.   Yes [provider]  atorvastatin (LIPITOR) 80 MG tablet Take 1 tablet (80 mg total) by mouth daily at 6 PM. 08/10/13  Yes Lyda Jester M, PA-C  Cholecalciferol (VITAMIN D) 50 MCG (2000 UT) CAPS Take 6,000 Units by mouth daily.   Yes [provider]  clopidogrel (PLAVIX) 75 MG tablet Take 1 tablet (75 mg total) by mouth daily. 05/24/14  Yes Lorretta Harp, MD  glipiZIDE (GLUCOTROL XL) 2.5 MG 24 hr tablet Take 2.5 mg by mouth daily. 12/18/20  Yes [provider]  lisinopril (PRINIVIL,ZESTRIL) 20 MG tablet Take 20 mg by mouth daily.   Yes [provider]  metoprolol tartrate (LOPRESSOR) 25 MG tablet Take 25 mg by mouth daily.   Yes [provider]  nitroGLYCERIN (NITROSTAT) 0.4 MG SL tablet Place 1 tablet (0.4 mg total) under the tongue every 5 (five) minutes x 3 doses as needed for chest pain. 03/21/13  Yes Simmons, Brittainy M, PA-C  Blood Glucose Monitoring Suppl (ONE TOUCH ULTRA SYSTEM KIT) W/DEVICE KIT 1 kit by Does not apply  route once. 03/21/13   Lyda Jester M, PA-C  ergocalciferol (VITAMIN D2) 1.25 MG (50000 UT) capsule Take 50,000 Units by mouth once a week. Patient not taking: Reported on 11/13/2020    [provider]    Physical Exam: Vitals:   12/26/20 1949 12/26/20 1950 12/26/20 2149 12/26/20 2200  BP: (!) 180/84  (!) 173/70 (!) 171/59  Pulse: 64  (!) 53 (!) 44  Resp: _0 Temp: 98 F (36.7 C)     TempSrc: Oral     SpO2: 100%  99% 98%  Weight:  100.7 kg    Height:  _0  (1.676 m)     Physical Exam Vitals reviewed.  Constitutional:      General: She is not in acute distress.    Appearance: She is not ill-appearing.  HENT:     Head: Normocephalic and atraumatic.     Right Ear: External ear normal.     Left Ear: External ear normal.     Nose: Nose normal.     Mouth/Throat:     Mouth: Mucous membranes are moist.  Eyes:     Extraocular Movements: Extraocular movements intact.     Pupils: Pupils are equal, round, and reactive to light.  Cardiovascular:     Rate and Rhythm: Normal rate and regular rhythm.     Pulses: Normal pulses.     Heart sounds: Normal heart sounds.  Pulmonary:     Effort: Pulmonary effort is normal.     Breath sounds: Normal breath sounds.  Abdominal:     General: Bowel sounds are normal.     Palpations: Abdomen is soft.  Musculoskeletal:     Cervical back: Normal range of motion and neck supple.  Skin:    General: Skin is warm.  Neurological:     General: No focal deficit present.     Mental Status: She is alert and oriented to person, place, and time.  Psychiatric:        Mood and Affect: Mood normal.        Behavior: Behavior normal.    Labs on Admission: I have personally reviewed following labs and imaging studies  No results for input(s): CKTOTAL, CKMB, TROPONINI in the last 72 hours. Lab Results  Component Value Date   WBC 9.3 12/26/2020   HGB 12.8 12/26/2020   HCT 38.7 12/26/2020   MCV 82.9 12/26/2020   PLT 241 12/26/2020     Recent Labs  Lab 12/26/20 1953  NA 142  K 3.5  CL 110  CO2 27  BUN 22  CREATININE 1.18*  CALCIUM 9.1  GLUCOSE 80   Lab Results  Component Value Date   CHOL 138 05/11/2020   HDL 48 05/11/2020   LDLCALC 66 05/11/2020   TRIG 137 05/11/2020   No results found for: DDIMER Invalid input(s): POCBNP   COVID-19 Labs No results for input(s): DDIMER, FERRITIN, LDH, CRP in the last 72 hours. Lab Results  Component Value Date   [place] NEGATIVE 12/26/2020    Radiological Exams on Admission: DG Chest 2 View  Result Date: 12/26/2020 CLINICAL DATA:  Chest pain EXAM: CHEST - 2 VIEW COMPARISON:  03/19/2013 FINDINGS: Cardiac shadow is mildly enlarged. Aortic calcifications are noted. Lungs are clear bilaterally. No focal infiltrate or effusion is seen. No bony abnormality is noted. IMPRESSION: No active cardiopulmonary disease. Electronically Signed   By: Inez Catalina M.D.   On: 12/26/2020 20:32    EKG:  EKG shows sinus rhythm 62 with narrow QRS normal axis and no ST-T wave changes.   Assessment/Plan: Principal Problem:   Chest pain Active Problems:   HTN (hypertension)   Diabetes mellitus (HCC)  Chest pain / UA/ NSTEMI:  Pt started on heparin gtt along with statin therapy.   Continued on plavix and heparin.   Continued on metoprolol/ prn ntg.    HTN: Blood pressure (!) 171/59, pulse (!) 44, temperature 98 F (36.7 C), temperature source Oral, resp. rate 15, height _1  (1.676 m), weight 100.7 kg, SpO2 98 %. Continue metoprolol, lisinopril and amlodipine.  DM II: SSI/ Glycemic protocol.  Accuchecks and a1c.    DVT prophylaxis:  Heparin gtt    Code Status:  Full code    Family Communication:  Lace, Chenevert (Spouse)  [phone] (Mobile)   Disposition Plan:  Home    Consults called:  None   Admission status: Inpatient.     Para Skeans MD [facility] [phone] How to contact the [facility] Attending or Consulting provider [place] or  covering provider during after hours [place], for this patient.    Check the care team in [facility] and look for a) attending/consulting [facility] provider listed and b) the [facility] team listed Log into [email or web address] and use [place]'s universal password to access. If you do not have the password, please contact the hospital operator. Locate the [facility] provider you are looking for under [facility] and page to a number that you can be directly reached. If you still have difficulty reaching the provider, please page the [facility] (Director on Call) for the Hospitalists listed on amion for assistance. [email or web address] Password [facility] 12/26/2020, 11:31 PM

## 2020-12-26 NOTE — ED Triage Notes (Signed)
Pt via [facility] c/o chest pain since 1730pm. Pt took 324mg  aspirin at home and received some NTG en route administered by EMS. Pt reports she had a STEMI with stent in 2015. Most recent cardiology appointment was Dec 5. No CP present during triage. When occurring, CP was non-radiating. Pt reports SOB at the time but denies nausea, dizziness, sweating. Currently in NAD.

## 2020-12-26 NOTE — Progress Notes (Signed)
ANTICOAGULATION CONSULT NOTE - Initial Consult  Pharmacy Consult for Heparin  Indication: chest pain/ACS  Allergies  Allergen Reactions   Aggrenox [Aspirin-Dipyridamole Er] Other (See Comments), Nausea Only and Nausea And Vomiting    Severe headache   Carvedilol Other (See Comments)    caused fatigue    Patient Measurements: Height: 5\' 6"  (167.6 cm) Weight: 100.7 kg (222 lb) IBW/kg (Calculated) : 59.3 Heparin Dosing Weight: 82.1 kg   Vital Signs: Temp: 98 F (36.7 C) (12/21 1949) Temp Source: Oral (12/21 1949) BP: 171/59 (12/21 2200) Pulse Rate: 44 (12/21 2200)  Labs: Recent Labs    12/26/20 1953 12/26/20 2152  HGB 12.8  --   HCT 38.7  --   PLT 241  --   LABPROT 13.0  --   INR 1.0  --   CREATININE 1.18*  --   TROPONINIHS 97* 400*    Estimated Creatinine Clearance: 53.9 mL/min (A) (by C-G formula based on SCr of 1.18 mg/dL (H)).   Medical History: Past Medical History:  Diagnosis Date   Abnormal CT scan, colon 05/21/2015   Anginal pain (HCC)    CAD S/P percutaneous coronary angioplasty 03/19/2013   s/p 2.25 mm x 15 mm (2.46) DES to mid AV Groove Circumflex   CKD (chronic kidney disease)    CVA (cerebral infarction)    Residual R leg weakness   Diabetes mellitus type 2 in obese (HCC)    Fibroid    GERD (gastroesophageal reflux disease)    Hyperlipidemia LDL goal <70    Hypertension    Nonsustained ventricular tachycardia    found on exam monitoring, asymptomatic   Obesity    Orthopnea    PVC's (premature ventricular contractions)    seen on telemetry during cardiac rehabilitation   Shortness of breath dyspnea    due to brilinta   ST elevation myocardial infarction (STEMI) of inferolateral wall, subsequent episode of care ([place]) 03/19/2013   s/p DES to mid AV Groove Circumflex; EF 50% on cath   Stroke [facility])    2001    Medications:  (Not in a hospital admission)   Assessment: Pharmacy consulted to dose heparin in this 70 year old female admitted  with ACS/NSTEMI.  No prior anticoag noted.   CrCl = 53.9 ml/min   Goal of Therapy:  Heparin level 0.3-0.7 units/ml Monitor platelets by anticoagulation protocol: Yes   Plan:  Give 4000 units bolus x 1 Start heparin infusion at 1000 units/hr Check anti-Xa level in 6 hours and daily while on heparin Continue to monitor H&H and platelets  Maycel Riffe D 12/26/2020,11:24 PM

## 2020-12-26 NOTE — ED Provider Notes (Signed)
[facility] Emergency Department Provider Note  Time seen: 9:56 PM  I have reviewed the triage vital signs and the nursing notes.   HISTORY  Chief Complaint Chest Pain   HPI Yvonne Shaw is a 70 y.o. female with a past medical history of angina, CAD status post stent 2015, CVA, CKD, hypertension, hyperlipidemia, presents to the emergency department for chest pain.  According to the patient they were going home from the store around 5:30 PM tonight when the patient developed mild to moderate central chest discomfort described more as a pressure feeling.  Patient took aspirin and nitroglycerin by EMS in route to the hospital.  States she was short of breath with the chest pain but denies any shortness of breath.  States mild chest pain currently.  No nausea or diaphoresis.  Past Medical History:  Diagnosis Date   Abnormal CT scan, colon 05/21/2015   Anginal pain (HCC)    CAD S/P percutaneous coronary angioplasty 03/19/2013   s/p 2.25 mm x 15 mm (2.46) DES to mid AV Groove Circumflex   CKD (chronic kidney disease)    CVA (cerebral infarction)    Residual R leg weakness   Diabetes mellitus type 2 in obese (HCC)    Fibroid    GERD (gastroesophageal reflux disease)    Hyperlipidemia LDL goal <70    Hypertension    Nonsustained ventricular tachycardia    found on exam monitoring, asymptomatic   Obesity    Orthopnea    PVC's (premature ventricular contractions)    seen on telemetry during cardiac rehabilitation   Shortness of breath dyspnea    due to brilinta   ST elevation myocardial infarction (STEMI) of inferolateral wall, subsequent episode of care ([place]) 03/19/2013   s/p DES to mid AV Groove Circumflex; EF 50% on cath   Stroke [facility])    2001    Patient Active Problem List   Diagnosis Date Noted   PVC's (premature ventricular contractions) 08/26/2013   Chest pain with moderate risk for cardiac etiology 05/28/2013   Sinus bradycardia 04/01/2013    Shortness of breath 03/29/2013   HTN (hypertension) 03/20/2013   Dyslipidemia, goal LDL below 70 03/20/2013   Diabetes mellitus ([place]) 03/20/2013   STEMI - s/p DES to mid AV groove Circ 03/19/13; EF 50% on cath 03/19/2013   ST elevation myocardial infarction (STEMI) of inferior wall ([place]) 03/19/2013   CAD S/P percutaneous coronary angioplasty 03/19/2013    Past Surgical History:  Procedure Laterality Date   ABDOMINAL HYSTERECTOMY     CATARACT EXTRACTION W/PHACO Right 09/05/2014   Procedure: CATARACT EXTRACTION PHACO AND INTRAOCULAR LENS PLACEMENT ([place]);  Surgeon: Birder Robson, MD;  Location: [facility] ORS;  Service: Ophthalmology;  Laterality: Right;  [place]: 01:12.2 AP%: 20.1 CDE: 14.45 Lot # [id number]   CHOLECYSTECTOMY     COLONOSCOPY WITH PROPOFOL N/A 07/02/2015   Procedure: COLONOSCOPY WITH PROPOFOL;  Surgeon: Manya Silvas, MD;  Location: [facility] ENDOSCOPY;  Service: Endoscopy;  Laterality: N/A;   COLONOSCOPY WITH PROPOFOL N/A 06/27/2020   Procedure: COLONOSCOPY WITH PROPOFOL;  Surgeon: Robert Bellow, MD;  Location: [facility] ENDOSCOPY;  Service: Endoscopy;  Laterality: N/A;  DM, TAKES PLAVIX   LEFT HEART CATHETERIZATION WITH CORONARY ANGIOGRAM N/A 03/19/2013   Procedure: LEFT HEART CATHETERIZATION WITH CORONARY ANGIOGRAM;  Surgeon: Lorretta Harp, MD;  Location: [facility] CATH LAB;  Service: Cardiovascular;  Laterality: N/A;   PERCUTANEOUS CORONARY STENT INTERVENTION (PCI-S)  03/19/2013   PCI to mCx - 90% lesion: 2.25 mm x  15 mm Xience Alpine DES (2.46 mm post-dilation); EF ~50%    Prior to Admission medications   Medication Sig Start Date End Date Taking? Authorizing Provider  amLODipine (NORVASC) 10 MG tablet Take 1 tablet by mouth daily. 09/15/18   [provider]  aspirin 81 MG tablet Take 81 mg by mouth daily.    [provider]  atorvastatin (LIPITOR) 80 MG tablet Take 1 tablet (80 mg total) by mouth daily at 6 PM. 08/10/13   Rosita Fire, Wilmer Floor, PA-C  Blood Glucose  Monitoring Suppl (ONE TOUCH ULTRA SYSTEM KIT) W/DEVICE KIT 1 kit by Does not apply route once. 03/21/13   Consuelo Pandy, PA-C  Cholecalciferol (VITAMIN D) 50 MCG (2000 UT) CAPS Take 6,000 Units by mouth daily.    [provider]  clopidogrel (PLAVIX) 75 MG tablet Take 1 tablet (75 mg total) by mouth daily. 05/24/14   Lorretta Harp, MD  ergocalciferol (VITAMIN D2) 1.25 MG (50000 UT) capsule Take 50,000 Units by mouth once a week. Patient not taking: Reported on 11/13/2020    [provider]  ezetimibe (ZETIA) 10 MG tablet Take 1 tablet (10 mg total) by mouth daily. 02/13/20 11/13/20  Hilty, Nadean Corwin, MD  GLIPIZIDE XL 5 MG 24 hr tablet Take 1 tablet by mouth daily. 05/28/16   [provider]  hydrochlorothiazide (MICROZIDE) 12.5 MG capsule Take 1 capsule (12.5 mg total) by mouth daily. 08/10/13   Lyda Jester M, PA-C  lisinopril (PRINIVIL,ZESTRIL) 20 MG tablet Take 20 mg by mouth daily.    [provider]  metoprolol tartrate (LOPRESSOR) 25 MG tablet Take 25 mg by mouth daily.    [provider]  nitroGLYCERIN (NITROSTAT) 0.4 MG SL tablet Place 1 tablet (0.4 mg total) under the tongue every 5 (five) minutes x 3 doses as needed for chest pain. 03/21/13   Lyda Jester M, PA-C  omeprazole (PRILOSEC) 40 MG capsule Take by mouth. Take 1 capsules by mouth once daily in morning Patient not taking: Reported on 11/13/2020 04/12/20 05/27/20  [provider]    Allergies  Allergen Reactions   Aggrenox [Aspirin-Dipyridamole Er] Other (See Comments), Nausea Only and Nausea And Vomiting    Severe headache   Carvedilol Other (See Comments)    caused fatigue    Family History  Problem Relation Age of Onset   Cancer Mother    Diabetes Mother    Hypertension Mother    Hypertension Father    Cancer Father    Diabetes Father    Hypertension Sister    Diabetes Sister    Heart failure Brother    Diabetes Brother    Heart attack Maternal  Grandfather    Breast cancer Neg Hx     Social History Social History   Tobacco Use   Smoking status: Never   Smokeless tobacco: Never  Vaping Use   Vaping Use: Never used  Substance Use Topics   Alcohol use: No   Drug use: No    Review of Systems Constitutional: Negative for fever. Cardiovascular: Moderate chest pain around 5:30 PM, very slight chest pain currently. Respiratory: Mild shortness of breath around 5:30 PM when the chest pain began, none currently.  No cough. Gastrointestinal: Negative for abdominal pain, vomiting  Musculoskeletal: Negative for musculoskeletal complaints Neurological: Negative for headache All other ROS negative  ____________________________________________   PHYSICAL EXAM:  VITAL SIGNS: ED Triage Vitals  Enc Vitals Group     BP 12/26/20 1949 (!) 180/84  Pulse Rate 12/26/20 1949 64     Resp 12/26/20 1949 16     Temp 12/26/20 1949 98 F (36.7 C)     Temp Source 12/26/20 1949 Oral     SpO2 12/26/20 1949 100 %     Weight 12/26/20 1950 222 lb (100.7 kg)     Height 12/26/20 1950 $RemoveBefor'5\' 6"'[id number]$  (1.676 m)     Head Circumference --      Peak Flow --      Pain Score 12/26/20 1950 10     Pain Loc --      Pain Edu? --      Excl. in [place]? --    Constitutional: Alert and oriented. Well appearing and in no distress. Eyes: Normal exam ENT      Head: Normocephalic and atraumatic.      Mouth/Throat: Mucous membranes are moist. Cardiovascular: Normal rate, regular rhythm.  Respiratory: Normal respiratory effort without tachypnea nor retractions. Breath sounds are clear  Gastrointestinal: Soft and nontender. No distention. Musculoskeletal: Nontender with normal range of motion in all extremities. Neurologic:  Normal speech and language. No gross focal neurologic deficits  Skin:  Skin is warm, dry and intact.  Psychiatric: Mood and affect are normal.  ____________________________________________    EKG  EKG viewed and interpreted by myself shows a  normal sinus rhythm at 62 bpm with a narrow QRS, normal axis, normal intervals.  Reassuring ST-T segments.  ____________________________________________    RADIOLOGY  Chest x-ray is negative for acute abnormality.  ____________________________________________   INITIAL IMPRESSION / ASSESSMENT AND PLAN / ED COURSE  Pertinent labs & imaging results that were available during my care of the patient were reviewed by me and considered in my medical decision making (see chart for details).   Patient presents emergency department for cute onset of chest pain around 5:30 PM.  Patient does have a prior stent placed in 2015, follows up with cardiology in [place].  Patient states pain is moderate central chest pressure, now very slight chest discomfort.  Patient states she has had mild congestion recently but denies any cough or fever.  Initial troponin is somewhat elevated at 97.  Repeat troponin is pending.  EKG is reassuring, chest x-ray is reassuring.  We will test for COVID/flu as well.  Patient is repeat troponin is elevated to 400.  We will order heparin for her NSTEMI.  Patient states very minimal chest discomfort 1 or 2/10 currently.  Spoke to the hospitalist regarding admission, we will obtain a CTA of the chest as well as a precaution.  Patient agreeable to plan of care for admission and cardiology consultation.  Given the patient's minimal chest discomfort with reassuring EKG I believe patient is stable for admission on heparin infusion.  [name] was evaluated in Emergency Department on 12/26/2020 for the symptoms described in the history of present illness. She was evaluated in the context of the global COVID-19 pandemic, which necessitated consideration that the patient might be at risk for infection with the SARS-CoV-2 virus that causes COVID-19. Institutional protocols and algorithms that pertain to the evaluation of patients at risk for COVID-19 are in a state of rapid change based  on information released by regulatory bodies including the CDC and federal and state organizations. These policies and algorithms were followed during the patient's care in the ED.  CRITICAL CARE Performed by: Harvest Dark   Total critical care time: 30 minutes  Critical care time was exclusive of separately billable procedures and treating  other patients.  Critical care was necessary to treat or prevent imminent or life-threatening deterioration.  Critical care was time spent personally by me on the following activities: development of treatment plan with patient and/or surrogate as well as nursing, discussions with consultants, evaluation of patient's response to treatment, examination of patient, obtaining history from patient or surrogate, ordering and performing treatments and interventions, ordering and review of laboratory studies, ordering and review of radiographic studies, pulse oximetry and re-evaluation of patient's condition.  ____________________________________________   FINAL CLINICAL IMPRESSION(S) / ED DIAGNOSES  Chest pain NSTEMI   Harvest Dark, MD 12/26/20 2323

## 2020-12-26 NOTE — ED Triage Notes (Signed)
EMS brings pt in from home for c/o CP since 530pm; 4 ASA taken at home

## 2020-12-27 ENCOUNTER — Other Ambulatory Visit: Payer: Self-pay | Admitting: Cardiovascular Disease

## 2020-12-27 DIAGNOSIS — I214 Non-ST elevation (NSTEMI) myocardial infarction: Secondary | ICD-10-CM

## 2020-12-27 DIAGNOSIS — E782 Mixed hyperlipidemia: Secondary | ICD-10-CM

## 2020-12-27 DIAGNOSIS — I200 Unstable angina: Secondary | ICD-10-CM

## 2020-12-27 DIAGNOSIS — I10 Essential (primary) hypertension: Secondary | ICD-10-CM

## 2020-12-27 LAB — TROPONIN I (HIGH SENSITIVITY)
Troponin I (High Sensitivity): 1669 ng/L (ref <18)
Troponin I (High Sensitivity): 1513 ng/L (ref <18)

## 2020-12-27 LAB — HIV ANTIBODY (ROUTINE TESTING W REFLEX): HIV Screen 4th Generation wRfx: NONREACTIVE

## 2020-12-27 LAB — HEPARIN LEVEL (UNFRACTIONATED)
Heparin Unfractionated: >1.1 IU/mL — ABNORMAL HIGH (ref 0.30–0.70)
Heparin Unfractionated: 0.2 IU/mL — ABNORMAL LOW (ref 0.30–0.70)

## 2020-12-27 MED ORDER — HEPARIN BOLUS VIA INFUSION
1200.0000 [IU] | Freq: Once | INTRAVENOUS | Status: AC
  Administered 2020-12-27: 19:00:00 1200 [IU] via INTRAVENOUS
  Filled 2020-12-27: qty 1200

## 2020-12-27 MED ORDER — ASPIRIN 81 MG PO CHEW
81.0000 mg | CHEWABLE_TABLET | ORAL | Status: AC
  Administered 2020-12-28: 05:00:00 81 mg via ORAL
  Filled 2020-12-27: qty 1

## 2020-12-27 MED ORDER — SODIUM CHLORIDE 0.9 % WEIGHT BASED INFUSION
1.0000 mL/kg/h | INTRAVENOUS | Status: DC
  Administered 2020-12-28: 07:00:00 1 mL/kg/h via INTRAVENOUS

## 2020-12-27 MED ORDER — SODIUM CHLORIDE 0.9 % WEIGHT BASED INFUSION: 3.0000 mL/kg/h | INTRAVENOUS | Status: DC

## 2020-12-27 MED ORDER — HEPARIN (PORCINE) 25000 UT/250ML-% IV SOLN
900.0000 [IU]/h | INTRAVENOUS | Status: DC
Start: 2020-12-27 — End: 2020-12-28
  Administered 2020-12-27: 11:00:00 750 [IU]/h via INTRAVENOUS
  Administered 2020-12-28: 07:00:00 900 [IU]/h via INTRAVENOUS
  Filled 2020-12-27: qty 250

## 2020-12-27 MED ORDER — ASPIRIN EC 81 MG PO TBEC: 81.0000 mg | DELAYED_RELEASE_TABLET | Freq: Every day | ORAL | Status: DC

## 2020-12-27 NOTE — H&P (View-Only) (Signed)
Cardiology Consultation:   Patient ID: Yvonne Shaw MRN: [id number]; DOB: 1950-06-26  Admit date: 12/26/2020 Date of Consult: 12/27/2020  PCP:  System, Provider Not In   [facility] Providers Cardiologist:  Quay Burow, MD   {   Patient Profile:   Yvonne Shaw is a 70 y.o. female with a hx of CVA, HTN, HLD, obesity, Inferior STEMI in 2015 s/p DES to Cx, DM2 who is being seen 12/27/2020 for the evaluation of chest pain at the request of Dr. Billie Ruddy.  History of Present Illness:   Yvonne Shaw is followed by Dr. Gwenlyn Found for the above cardiac issues. In 03/2013 she was admitted for STEMI. She was transferred from [facility] to [facility] for emergent cath. Cath showed high grade mid AV groove circumflex stenosis which was stented with DES. History of SOB on Brilinta changed to Plavix. She had subsequent chest pain and underwent Myoview lexiscan that was nonischemic. Noted to have frequent PVCs. 30 day event monitor showed short runs of NSVT.   The patient presented to [facility] ED 12/26/20 for chest pain. Pain started at around 5:30 in the afternoon. It was centralized and sharp, 10/10. She felt SOB. Did not have SL NITRO to take. Says the pain is similar to prior heart attack. No nausea or diaphoresis.   In the ER HS troponin was elevated 97>400. BP elevated 180/84, pulse 64, RR 16. EKG showed no acute changes. Respiratory panel negative. CTA chest negative for PE or other acute process. Started on IV heparin and admitted. She reports intermittent dull chest pain.   Past Medical History:  Diagnosis Date   Abnormal CT scan, colon 05/21/2015   Anginal pain (HCC)    CAD S/P percutaneous coronary angioplasty 03/19/2013   s/p 2.25 mm x 15 mm (2.46) DES to mid AV Groove Circumflex   CKD (chronic kidney disease)    CVA (cerebral infarction)    Residual R leg weakness   Diabetes mellitus type 2 in obese (HCC)    Fibroid    GERD (gastroesophageal reflux disease)    Hyperlipidemia LDL goal <70    Hypertension     Nonsustained ventricular tachycardia    found on exam monitoring, asymptomatic   Obesity    Orthopnea    PVC's (premature ventricular contractions)    seen on telemetry during cardiac rehabilitation   Shortness of breath dyspnea    due to brilinta   ST elevation myocardial infarction (STEMI) of inferolateral wall, subsequent episode of care ([place]) 03/19/2013   s/p DES to mid AV Groove Circumflex; EF 50% on cath   Stroke [facility])    2001    Past Surgical History:  Procedure Laterality Date   ABDOMINAL HYSTERECTOMY     CATARACT EXTRACTION W/PHACO Right 09/05/2014   Procedure: CATARACT EXTRACTION PHACO AND INTRAOCULAR LENS PLACEMENT ([place]);  Surgeon: Birder Robson, MD;  Location: [facility] ORS;  Service: Ophthalmology;  Laterality: Right;  [place]: 01:12.2 AP%: 20.1 CDE: 14.45 Lot # [id number]   CHOLECYSTECTOMY     COLONOSCOPY WITH PROPOFOL N/A 07/02/2015   Procedure: COLONOSCOPY WITH PROPOFOL;  Surgeon: Manya Silvas, MD;  Location: [facility] ENDOSCOPY;  Service: Endoscopy;  Laterality: N/A;   COLONOSCOPY WITH PROPOFOL N/A 06/27/2020   Procedure: COLONOSCOPY WITH PROPOFOL;  Surgeon: Robert Bellow, MD;  Location: [facility] ENDOSCOPY;  Service: Endoscopy;  Laterality: N/A;  DM, TAKES PLAVIX   LEFT HEART CATHETERIZATION WITH CORONARY ANGIOGRAM N/A 03/19/2013   Procedure: LEFT HEART CATHETERIZATION WITH CORONARY ANGIOGRAM;  Surgeon: Lorretta Harp, MD;  Location: [place] CATH LAB;  Service: Cardiovascular;  Laterality: N/A;   PERCUTANEOUS CORONARY STENT INTERVENTION (PCI-S)  03/19/2013   PCI to mCx - 90% lesion: 2.25 mm x 15 mm Xience Alpine DES (2.46 mm post-dilation); EF ~50%     Home Medications:  Prior to Admission medications   Medication Sig Start Date End Date Taking? Authorizing Provider  amLODipine (NORVASC) 10 MG tablet Take 1 tablet by mouth daily. 09/15/18  Yes [provider]  aspirin 81 MG tablet Take 81 mg by mouth daily.   Yes [provider]  atorvastatin (LIPITOR) 80 MG  tablet Take 1 tablet (80 mg total) by mouth daily at 6 PM. 08/10/13  Yes Lyda Jester M, PA-C  Cholecalciferol (VITAMIN D) 50 MCG (2000 UT) CAPS Take 6,000 Units by mouth daily.   Yes [provider]  clopidogrel (PLAVIX) 75 MG tablet Take 1 tablet (75 mg total) by mouth daily. 05/24/14  Yes Lorretta Harp, MD  glipiZIDE (GLUCOTROL XL) 2.5 MG 24 hr tablet Take 2.5 mg by mouth daily. 12/18/20  Yes [provider]  lisinopril (PRINIVIL,ZESTRIL) 20 MG tablet Take 20 mg by mouth daily.   Yes [provider]  metoprolol tartrate (LOPRESSOR) 25 MG tablet Take 25 mg by mouth daily.   Yes [provider]  nitroGLYCERIN (NITROSTAT) 0.4 MG SL tablet Place 1 tablet (0.4 mg total) under the tongue every 5 (five) minutes x 3 doses as needed for chest pain. 03/21/13  Yes Simmons, Brittainy M, PA-C  Blood Glucose Monitoring Suppl (ONE TOUCH ULTRA SYSTEM KIT) W/DEVICE KIT 1 kit by Does not apply route once. 03/21/13   Lyda Jester M, PA-C  ergocalciferol (VITAMIN D2) 1.25 MG (50000 UT) capsule Take 50,000 Units by mouth once a week. Patient not taking: Reported on 11/13/2020    [provider]    Inpatient Medications: Scheduled Meds:  amLODipine  10 mg Oral Daily   atorvastatin  80 mg Oral q1800   clopidogrel  75 mg Oral Daily   lisinopril  20 mg Oral Daily   metoprolol tartrate  25 mg Oral Daily   Continuous Infusions:  heparin 750 Units/hr (12/27/20 1109)   PRN Meds: acetaminophen, nitroGLYCERIN, ondansetron (ZOFRAN) IV  Allergies:    Allergies  Allergen Reactions   Aggrenox [Aspirin-Dipyridamole Er] Other (See Comments), Nausea Only and Nausea And Vomiting    Severe headache   Carvedilol Other (See Comments)    caused fatigue    Social History:   Social History   Socioeconomic History   Marital status: Married    Spouse name: Not on file   Number of children: Not on file   Years of education: Not on file   Highest education level:  Not on file  Occupational History   Not on file  Tobacco Use   Smoking status: Never   Smokeless tobacco: Never  Vaping Use   Vaping Use: Never used  Substance and Sexual Activity   Alcohol use: No   Drug use: No   Sexual activity: Not on file  Other Topics Concern   Not on file  Social History Narrative   Married, 2 healthy children. Lives in [place], [place]. Works at [employer].   Social Determinants of Health   Financial Resource Strain: Not on file  Food Insecurity: Not on file  Transportation Needs: Not on file  Physical Activity: Not on file  Stress: Not on file  Social Connections: Not on file  Intimate Partner Violence: Not on file  Family History:    Family History  Problem Relation Age of Onset   Cancer Mother    Diabetes Mother    Hypertension Mother    Hypertension Father    Cancer Father    Diabetes Father    Hypertension Sister    Diabetes Sister    Heart failure Brother    Diabetes Brother    Heart attack Maternal Grandfather    Breast cancer Neg Hx      ROS:  Please see the history of present illness.   All other ROS reviewed and negative.     Physical Exam/Data:   Vitals:   12/27/20 0907 12/27/20 0930 12/27/20 1000 12/27/20 1030  BP:  (!) 142/73 (!) 156/67 (!) 143/60  Pulse: (!) 46 (!) 49 (!) 54 (!) 53  Resp:  _0 Temp:      TempSrc:      SpO2:  99% 98% 99%  Weight:      Height:        Intake/Output Summary (Last 24 hours) at 12/27/2020 1241 Last data filed at 12/27/2020 0658 Gross per 24 hour  Intake 326.67 ml  Output --  Net 326.67 ml   Last 3 Weights 12/26/2020 11/13/2020 06/27/2020  Weight (lbs) 222 lb 222 lb 6.4 oz 240 lb  Weight (kg) 100.699 kg 100.88 kg 108.863 kg     Body mass index is 35.83 kg/m.  General:  Well nourished, well developed, in no acute distress HEENT: normal Neck: no JVD Vascular: No carotid bruits; Distal pulses 2+ bilaterally Cardiac:  normal S1, S2; RRR; no murmur  Lungs:  clear to auscultation  bilaterally, no wheezing, rhonchi or rales  Abd: soft, nontender, no hepatomegaly  Ext: no edema Musculoskeletal:  No deformities, BUE and BLE strength normal and equal Skin: warm and dry  Neuro:  CNs 2-12 intact, no focal abnormalities noted Psych:  Normal affect   EKG:  The EKG was personally reviewed and demonstrates:  NSR, 62bpm, poor baseline quality, no ST/T wave changes Telemetry:  Telemetry was personally reviewed and demonstrates:  NSR, HR 50-60s, frequent PVCs  Relevant CV Studies:  Echo ordered  Laboratory Data:  High Sensitivity Troponin:   Recent Labs  Lab 12/26/20 1953 12/26/20 2152  TROPONINIHS 97* 400*     Chemistry Recent Labs  Lab 12/26/20 1953  NA 142  K 3.5  CL 110  CO2 27  GLUCOSE 80  BUN 22  CREATININE 1.18*  CALCIUM 9.1  GFRNONAA 50*  ANIONGAP 5    No results for input(s): PROT, ALBUMIN, AST, ALT, ALKPHOS, BILITOT in the last 168 hours. Lipids No results for input(s): CHOL, TRIG, HDL, LABVLDL, LDLCALC, CHOLHDL in the last 168 hours.  Hematology Recent Labs  Lab 12/26/20 1953  WBC 9.3  RBC 4.67  HGB 12.8  HCT 38.7  MCV 82.9  MCH 27.4  MCHC 33.1  RDW 14.6  PLT 241   Thyroid No results for input(s): TSH, FREET4 in the last 168 hours.  BNPNo results for input(s): BNP, PROBNP in the last 168 hours.  DDimer No results for input(s): DDIMER in the last 168 hours.   Radiology/Studies:  DG Chest 2 View  Result Date: 12/26/2020 CLINICAL DATA:  Chest pain EXAM: CHEST - 2 VIEW COMPARISON:  03/19/2013 FINDINGS: Cardiac shadow is mildly enlarged. Aortic calcifications are noted. Lungs are clear bilaterally. No focal infiltrate or effusion is seen. No bony abnormality is noted. IMPRESSION: No active cardiopulmonary disease. Electronically Signed   By: Elta Guadeloupe  Lukens M.D.   On: 12/26/2020 20:32   CT Angio Chest PE W and/or Wo Contrast  Result Date: 12/27/2020 CLINICAL DATA:  Chest pain. EXAM: CT ANGIOGRAPHY CHEST WITH CONTRAST TECHNIQUE:  Multidetector CT imaging of the chest was performed using the standard protocol during bolus administration of intravenous contrast. Multiplanar CT image reconstructions and MIPs were obtained to evaluate the vascular anatomy. CONTRAST:  18m OMNIPAQUE IOHEXOL 350 MG/ML SOLN COMPARISON:  None. FINDINGS: Cardiovascular: There is mild to moderate severity calcification of the aortic arch, without evidence of aortic aneurysm. Satisfactory opacification of the pulmonary arteries to the segmental level. No evidence of pulmonary embolism. There is mild cardiomegaly. No pericardial effusion. Mediastinum/Nodes: No enlarged mediastinal, hilar, or axillary lymph nodes. Thyroid gland, trachea, and esophagus demonstrate no significant findings. Lungs/Pleura: Very mild linear atelectasis is seen within the left lower lobe. There is no evidence of an acute infiltrate, pleural effusion or pneumothorax. Upper Abdomen: Multiple surgical clips are seen within the gallbladder fossa. A 3.1 cm x 2.7 cm cyst is seen within the anterior aspect of the right lobe of the liver. A 1.9 cm diameter simple cyst is noted within the upper pole of the left kidney. Musculoskeletal: No chest wall abnormality. No acute or significant osseous findings. Review of the MIP images confirms the above findings. IMPRESSION: 1. No evidence of pulmonary embolism or other acute intrathoracic process. 2. Mild cardiomegaly. 3. Hepatic and left renal cysts. 4. Aortic atherosclerosis. Aortic Atherosclerosis (ICD10-I70.0). Electronically Signed   By: TVirgina NorfolkM.D.   On: 12/27/2020 00:19     Assessment and Plan:   NSTEMI CAD with DES to Cx in 2015 - presents with chest pain similar to prior MI - HS trop 400. Will check another level - EKG without acute changes - continue IV heparin - order echo - continue Plavix, says she has allergy to ASA - continue Lipitor, Zetia, Lopressor - Plan for LHC, may be today, she is NPO Risks and benefits of  cardiac catheterization have been discussed with the patient.  These include bleeding, infection, kidney damage, stroke, heart attack, death.  The patient understands these risks and is willing to proceed.   Hypertension - Elevated SBP to 180s on arrival - Restarted on PTA amlodipine 175m lisinopril 2079maily, Lopressor 32m55mily - Bps improved - HR low at times, continue to monitor on telemetry  DM2 - SSI per IM  HLD - followed by lipid clinic - LDL 66 05/2020 - continue statin and ?Zetia (unsure if this was stopped)  For questions or updates, please contact [email or web address] consult [email or web address] for contact info under    Signed, Wilkins Elpers H FuNinfa Meeker-C  12/27/2020 12:41 PM

## 2020-12-27 NOTE — Consult Note (Signed)
Cardiology Consultation:   Patient ID: Yvonne Shaw MRN: [id number]; DOB: 1950-06-26  Admit date: 12/26/2020 Date of Consult: 12/27/2020  PCP:  System, Provider Not In   [facility] Providers Cardiologist:  Quay Burow, MD   {   Patient Profile:   Yvonne Shaw is a 70 y.o. female with a hx of CVA, HTN, HLD, obesity, Inferior STEMI in 2015 s/p DES to Cx, DM2 who is being seen 12/27/2020 for the evaluation of chest pain at the request of Dr. Billie Ruddy.  History of Present Illness:   [name] is followed by Dr. Gwenlyn Found for the above cardiac issues. In 03/2013 she was admitted for STEMI. She was transferred from [facility] to [facility] for emergent cath. Cath showed high grade mid AV groove circumflex stenosis which was stented with DES. History of SOB on Brilinta changed to Plavix. She had subsequent chest pain and underwent Myoview lexiscan that was nonischemic. Noted to have frequent PVCs. 30 day event monitor showed short runs of NSVT.   The patient presented to [facility] ED 12/26/20 for chest pain. Pain started at around 5:30 in the afternoon. It was centralized and sharp, 10/10. She felt SOB. Did not have SL NITRO to take. Says the pain is similar to prior heart attack. No nausea or diaphoresis.   In the ER HS troponin was elevated 97>400. BP elevated 180/84, pulse 64, RR 16. EKG showed no acute changes. Respiratory panel negative. CTA chest negative for PE or other acute process. Started on IV heparin and admitted. She reports intermittent dull chest pain.   Past Medical History:  Diagnosis Date   Abnormal CT scan, colon 05/21/2015   Anginal pain (HCC)    CAD S/P percutaneous coronary angioplasty 03/19/2013   s/p 2.25 mm x 15 mm (2.46) DES to mid AV Groove Circumflex   CKD (chronic kidney disease)    CVA (cerebral infarction)    Residual R leg weakness   Diabetes mellitus type 2 in obese (HCC)    Fibroid    GERD (gastroesophageal reflux disease)    Hyperlipidemia LDL goal <70    Hypertension     Nonsustained ventricular tachycardia    found on exam monitoring, asymptomatic   Obesity    Orthopnea    PVC's (premature ventricular contractions)    seen on telemetry during cardiac rehabilitation   Shortness of breath dyspnea    due to brilinta   ST elevation myocardial infarction (STEMI) of inferolateral wall, subsequent episode of care ([place]) 03/19/2013   s/p DES to mid AV Groove Circumflex; EF 50% on cath   Stroke [facility])    2001    Past Surgical History:  Procedure Laterality Date   ABDOMINAL HYSTERECTOMY     CATARACT EXTRACTION W/PHACO Right 09/05/2014   Procedure: CATARACT EXTRACTION PHACO AND INTRAOCULAR LENS PLACEMENT ([place]);  Surgeon: Birder Robson, MD;  Location: [facility] ORS;  Service: Ophthalmology;  Laterality: Right;  [place]: 01:12.2 AP%: 20.1 CDE: 14.45 Lot # [id number]   CHOLECYSTECTOMY     COLONOSCOPY WITH PROPOFOL N/A 07/02/2015   Procedure: COLONOSCOPY WITH PROPOFOL;  Surgeon: Manya Silvas, MD;  Location: [facility] ENDOSCOPY;  Service: Endoscopy;  Laterality: N/A;   COLONOSCOPY WITH PROPOFOL N/A 06/27/2020   Procedure: COLONOSCOPY WITH PROPOFOL;  Surgeon: Robert Bellow, MD;  Location: [facility] ENDOSCOPY;  Service: Endoscopy;  Laterality: N/A;  DM, TAKES PLAVIX   LEFT HEART CATHETERIZATION WITH CORONARY ANGIOGRAM N/A 03/19/2013   Procedure: LEFT HEART CATHETERIZATION WITH CORONARY ANGIOGRAM;  Surgeon: Lorretta Harp, MD;  Location: [place] CATH LAB;  Service: Cardiovascular;  Laterality: N/A;   PERCUTANEOUS CORONARY STENT INTERVENTION (PCI-S)  03/19/2013   PCI to mCx - 90% lesion: 2.25 mm x 15 mm Xience Alpine DES (2.46 mm post-dilation); EF ~50%     Home Medications:  Prior to Admission medications   Medication Sig Start Date End Date Taking? Authorizing Provider  amLODipine (NORVASC) 10 MG tablet Take 1 tablet by mouth daily. 09/15/18  Yes [provider]  aspirin 81 MG tablet Take 81 mg by mouth daily.   Yes [provider]  atorvastatin (LIPITOR) 80 MG  tablet Take 1 tablet (80 mg total) by mouth daily at 6 PM. 08/10/13  Yes Lyda Jester M, PA-C  Cholecalciferol (VITAMIN D) 50 MCG (2000 UT) CAPS Take 6,000 Units by mouth daily.   Yes [provider]  clopidogrel (PLAVIX) 75 MG tablet Take 1 tablet (75 mg total) by mouth daily. 05/24/14  Yes Lorretta Harp, MD  glipiZIDE (GLUCOTROL XL) 2.5 MG 24 hr tablet Take 2.5 mg by mouth daily. 12/18/20  Yes [provider]  lisinopril (PRINIVIL,ZESTRIL) 20 MG tablet Take 20 mg by mouth daily.   Yes [provider]  metoprolol tartrate (LOPRESSOR) 25 MG tablet Take 25 mg by mouth daily.   Yes [provider]  nitroGLYCERIN (NITROSTAT) 0.4 MG SL tablet Place 1 tablet (0.4 mg total) under the tongue every 5 (five) minutes x 3 doses as needed for chest pain. 03/21/13  Yes Simmons, Brittainy M, PA-C  Blood Glucose Monitoring Suppl (ONE TOUCH ULTRA SYSTEM KIT) W/DEVICE KIT 1 kit by Does not apply route once. 03/21/13   Lyda Jester M, PA-C  ergocalciferol (VITAMIN D2) 1.25 MG (50000 UT) capsule Take 50,000 Units by mouth once a week. Patient not taking: Reported on 11/13/2020    [provider]    Inpatient Medications: Scheduled Meds:  amLODipine  10 mg Oral Daily   atorvastatin  80 mg Oral q1800   clopidogrel  75 mg Oral Daily   lisinopril  20 mg Oral Daily   metoprolol tartrate  25 mg Oral Daily   Continuous Infusions:  heparin 750 Units/hr (12/27/20 1109)   PRN Meds: acetaminophen, nitroGLYCERIN, ondansetron (ZOFRAN) IV  Allergies:    Allergies  Allergen Reactions   Aggrenox [Aspirin-Dipyridamole Er] Other (See Comments), Nausea Only and Nausea And Vomiting    Severe headache   Carvedilol Other (See Comments)    caused fatigue    Social History:   Social History   Socioeconomic History   Marital status: Married    Spouse name: Not on file   Number of children: Not on file   Years of education: Not on file   Highest education level:  Not on file  Occupational History   Not on file  Tobacco Use   Smoking status: Never   Smokeless tobacco: Never  Vaping Use   Vaping Use: Never used  Substance and Sexual Activity   Alcohol use: No   Drug use: No   Sexual activity: Not on file  Other Topics Concern   Not on file  Social History Narrative   Married, 2 healthy children. Lives in [place], [place]. Works at [employer].   Social Determinants of Health   Financial Resource Strain: Not on file  Food Insecurity: Not on file  Transportation Needs: Not on file  Physical Activity: Not on file  Stress: Not on file  Social Connections: Not on file  Intimate Partner Violence: Not on file  Family History:    Family History  Problem Relation Age of Onset   Cancer Mother    Diabetes Mother    Hypertension Mother    Hypertension Father    Cancer Father    Diabetes Father    Hypertension Sister    Diabetes Sister    Heart failure Brother    Diabetes Brother    Heart attack Maternal Grandfather    Breast cancer Neg Hx      ROS:  Please see the history of present illness.   All other ROS reviewed and negative.     Physical Exam/Data:   Vitals:   12/27/20 0907 12/27/20 0930 12/27/20 1000 12/27/20 1030  BP:  (!) 142/73 (!) 156/67 (!) 143/60  Pulse: (!) 46 (!) 49 (!) 54 (!) 53  Resp:  _0 Temp:      TempSrc:      SpO2:  99% 98% 99%  Weight:      Height:        Intake/Output Summary (Last 24 hours) at 12/27/2020 1241 Last data filed at 12/27/2020 0658 Gross per 24 hour  Intake 326.67 ml  Output --  Net 326.67 ml   Last 3 Weights 12/26/2020 11/13/2020 06/27/2020  Weight (lbs) 222 lb 222 lb 6.4 oz 240 lb  Weight (kg) 100.699 kg 100.88 kg 108.863 kg     Body mass index is 35.83 kg/m.  General:  Well nourished, well developed, in no acute distress HEENT: normal Neck: no JVD Vascular: No carotid bruits; Distal pulses 2+ bilaterally Cardiac:  normal S1, S2; RRR; no murmur  Lungs:  clear to auscultation  bilaterally, no wheezing, rhonchi or rales  Abd: soft, nontender, no hepatomegaly  Ext: no edema Musculoskeletal:  No deformities, BUE and BLE strength normal and equal Skin: warm and dry  Neuro:  CNs 2-12 intact, no focal abnormalities noted Psych:  Normal affect   EKG:  The EKG was personally reviewed and demonstrates:  NSR, 62bpm, poor baseline quality, no ST/T wave changes Telemetry:  Telemetry was personally reviewed and demonstrates:  NSR, HR 50-60s, frequent PVCs  Relevant CV Studies:  Echo ordered  Laboratory Data:  High Sensitivity Troponin:   Recent Labs  Lab 12/26/20 1953 12/26/20 2152  TROPONINIHS 97* 400*     Chemistry Recent Labs  Lab 12/26/20 1953  NA 142  K 3.5  CL 110  CO2 27  GLUCOSE 80  BUN 22  CREATININE 1.18*  CALCIUM 9.1  GFRNONAA 50*  ANIONGAP 5    No results for input(s): PROT, ALBUMIN, AST, ALT, ALKPHOS, BILITOT in the last 168 hours. Lipids No results for input(s): CHOL, TRIG, HDL, LABVLDL, LDLCALC, CHOLHDL in the last 168 hours.  Hematology Recent Labs  Lab 12/26/20 1953  WBC 9.3  RBC 4.67  HGB 12.8  HCT 38.7  MCV 82.9  MCH 27.4  MCHC 33.1  RDW 14.6  PLT 241   Thyroid No results for input(s): TSH, FREET4 in the last 168 hours.  BNPNo results for input(s): BNP, PROBNP in the last 168 hours.  DDimer No results for input(s): DDIMER in the last 168 hours.   Radiology/Studies:  DG Chest 2 View  Result Date: 12/26/2020 CLINICAL DATA:  Chest pain EXAM: CHEST - 2 VIEW COMPARISON:  03/19/2013 FINDINGS: Cardiac shadow is mildly enlarged. Aortic calcifications are noted. Lungs are clear bilaterally. No focal infiltrate or effusion is seen. No bony abnormality is noted. IMPRESSION: No active cardiopulmonary disease. Electronically Signed   By: Elta Guadeloupe  Lukens M.D.   On: 12/26/2020 20:32   CT Angio Chest PE W and/or Wo Contrast  Result Date: 12/27/2020 CLINICAL DATA:  Chest pain. EXAM: CT ANGIOGRAPHY CHEST WITH CONTRAST TECHNIQUE:  Multidetector CT imaging of the chest was performed using the standard protocol during bolus administration of intravenous contrast. Multiplanar CT image reconstructions and MIPs were obtained to evaluate the vascular anatomy. CONTRAST:  18m OMNIPAQUE IOHEXOL 350 MG/ML SOLN COMPARISON:  None. FINDINGS: Cardiovascular: There is mild to moderate severity calcification of the aortic arch, without evidence of aortic aneurysm. Satisfactory opacification of the pulmonary arteries to the segmental level. No evidence of pulmonary embolism. There is mild cardiomegaly. No pericardial effusion. Mediastinum/Nodes: No enlarged mediastinal, hilar, or axillary lymph nodes. Thyroid gland, trachea, and esophagus demonstrate no significant findings. Lungs/Pleura: Very mild linear atelectasis is seen within the left lower lobe. There is no evidence of an acute infiltrate, pleural effusion or pneumothorax. Upper Abdomen: Multiple surgical clips are seen within the gallbladder fossa. A 3.1 cm x 2.7 cm cyst is seen within the anterior aspect of the right lobe of the liver. A 1.9 cm diameter simple cyst is noted within the upper pole of the left kidney. Musculoskeletal: No chest wall abnormality. No acute or significant osseous findings. Review of the MIP images confirms the above findings. IMPRESSION: 1. No evidence of pulmonary embolism or other acute intrathoracic process. 2. Mild cardiomegaly. 3. Hepatic and left renal cysts. 4. Aortic atherosclerosis. Aortic Atherosclerosis (ICD10-I70.0). Electronically Signed   By: TVirgina NorfolkM.D.   On: 12/27/2020 00:19     Assessment and Plan:   NSTEMI CAD with DES to Cx in 2015 - presents with chest pain similar to prior MI - HS trop 400. Will check another level - EKG without acute changes - continue IV heparin - order echo - continue Plavix, says she has allergy to ASA - continue Lipitor, Zetia, Lopressor - Plan for LHC, may be today, she is NPO Risks and benefits of  cardiac catheterization have been discussed with the patient.  These include bleeding, infection, kidney damage, stroke, heart attack, death.  The patient understands these risks and is willing to proceed.   Hypertension - Elevated SBP to 180s on arrival - Restarted on PTA amlodipine 175m lisinopril 2079maily, Lopressor 32m55mily - Bps improved - HR low at times, continue to monitor on telemetry  DM2 - SSI per IM  HLD - followed by lipid clinic - LDL 66 05/2020 - continue statin and ?Zetia (unsure if this was stopped)  For questions or updates, please contact [facility] consult [email or web address] for contact info under    Signed, Adriane Gabbert H FuNinfa Meeker-C  12/27/2020 12:41 PM

## 2020-12-27 NOTE — Progress Notes (Signed)
ANTICOAGULATION CONSULT NOTE  Pharmacy Consult for Heparin  Indication: chest pain/ACS  Allergies  Allergen Reactions   Aggrenox [Aspirin-Dipyridamole Er] Other (See Comments), Nausea Only and Nausea And Vomiting    Severe headache   Carvedilol Other (See Comments)    caused fatigue    Patient Measurements: Height: 5\' 6"  (167.6 cm) Weight: 100.7 kg (222 lb) IBW/kg (Calculated) : 59.3 Heparin Dosing Weight: 82.1 kg   Vital Signs: BP: 152/53 (12/22 1800) Pulse Rate: 55 (12/22 1800)  Labs: Recent Labs    12/26/20 1953 12/26/20 2152 12/27/20 0829 12/27/20 1313 12/27/20 1603 12/27/20 1741  HGB 12.8  --   --   --   --   --   HCT 38.7  --   --   --   --   --   PLT 241  --   --   --   --   --   APTT 31  --   --   --   --   --   LABPROT 13.0  --   --   --   --   --   INR 1.0  --   --   --   --   --   HEPARINUNFRC  --   --  >1.10*  --   --  0.20*  CREATININE 1.18*  --   --   --   --   --   TROPONINIHS 97* 400*  --  1,669* 1,513*  --      Estimated Creatinine Clearance: 53.9 mL/min (A) (by C-G formula based on SCr of 1.18 mg/dL (H)).   Medical History: Past Medical History:  Diagnosis Date   Abnormal CT scan, colon 05/21/2015   Anginal pain (HCC)    CAD S/P percutaneous coronary angioplasty 03/19/2013   s/p 2.25 mm x 15 mm (2.46) DES to mid AV Groove Circumflex   CKD (chronic kidney disease)    CVA (cerebral infarction)    Residual R leg weakness   Diabetes mellitus type 2 in obese (HCC)    Fibroid    GERD (gastroesophageal reflux disease)    Hyperlipidemia LDL goal <70    Hypertension    Nonsustained ventricular tachycardia    found on exam monitoring, asymptomatic   Obesity    Orthopnea    PVC's (premature ventricular contractions)    seen on telemetry during cardiac rehabilitation   Shortness of breath dyspnea    due to brilinta   ST elevation myocardial infarction (STEMI) of inferolateral wall, subsequent episode of care ([place]) 03/19/2013   s/p DES to  mid AV Groove Circumflex; EF 50% on cath   Stroke [facility])    2001     Assessment: Pharmacy consulted to dose heparin in this 70 year old female admitted with ACS/NSTEMI.  No prior anticoag noted.   CrCl = 53.9 ml/min   12/22 0829 HL > 1.10 1000 > 750 units/hr 12/22 1741 HL 0.20  Goal of Therapy:  Heparin level 0.3-0.7 units/ml Monitor platelets by anticoagulation protocol: Yes   Plan:  Heparin subtherapeutic Heparin 1200 unit bolus Increase heparin infusion to 900 units/hr Check HL 12/23 at 0200 Continue to monitor H&H and platelets  Jodell Weitman Burna Mortimer, PharmD, BCPS Clinical Pharmacist   12/27/2020,6:35 PM

## 2020-12-27 NOTE — Progress Notes (Signed)
ANTICOAGULATION CONSULT NOTE - Initial Consult  Pharmacy Consult for Heparin  Indication: chest pain/ACS  Allergies  Allergen Reactions   Aggrenox [Aspirin-Dipyridamole Er] Other (See Comments), Nausea Only and Nausea And Vomiting    Severe headache   Carvedilol Other (See Comments)    caused fatigue    Patient Measurements: Height: 5\' 6"  (167.6 cm) Weight: 100.7 kg (222 lb) IBW/kg (Calculated) : 59.3 Heparin Dosing Weight: 82.1 kg   Vital Signs: Temp: 98 F (36.7 C) (12/22 0500) BP: 176/66 (12/22 0800) Pulse Rate: 46 (12/22 0907)  Labs: Recent Labs    12/26/20 1953 12/26/20 2152 12/27/20 0829  HGB 12.8  --   --   HCT 38.7  --   --   PLT 241  --   --   APTT 31  --   --   LABPROT 13.0  --   --   INR 1.0  --   --   HEPARINUNFRC  --   --  >1.10*  CREATININE 1.18*  --   --   TROPONINIHS 97* 400*  --      Estimated Creatinine Clearance: 53.9 mL/min (A) (by C-G formula based on SCr of 1.18 mg/dL (H)).   Medical History: Past Medical History:  Diagnosis Date   Abnormal CT scan, colon 05/21/2015   Anginal pain (HCC)    CAD S/P percutaneous coronary angioplasty 03/19/2013   s/p 2.25 mm x 15 mm (2.46) DES to mid AV Groove Circumflex   CKD (chronic kidney disease)    CVA (cerebral infarction)    Residual R leg weakness   Diabetes mellitus type 2 in obese (HCC)    Fibroid    GERD (gastroesophageal reflux disease)    Hyperlipidemia LDL goal <70    Hypertension    Nonsustained ventricular tachycardia    found on exam monitoring, asymptomatic   Obesity    Orthopnea    PVC's (premature ventricular contractions)    seen on telemetry during cardiac rehabilitation   Shortness of breath dyspnea    due to brilinta   ST elevation myocardial infarction (STEMI) of inferolateral wall, subsequent episode of care ([place]) 03/19/2013   s/p DES to mid AV Groove Circumflex; EF 50% on cath   Stroke [facility])    2001    Medications:  (Not in a hospital  admission)  Assessment: Pharmacy consulted to dose heparin in this 70 year old female admitted with ACS/NSTEMI.  No prior anticoag noted.   CrCl = 53.9 ml/min   12/22 0829 HL > 1.10 1000 > 750 units/hr  Goal of Therapy:  Heparin level 0.3-0.7 units/ml Monitor platelets by anticoagulation protocol: Yes   Plan:  Heparin supratherapeutic Hold heparin infusion x 1 hour Resume heparin infusion at 750 units/hr Check anti-Xa level in 6 hours following restart Continue to monitor H&H and platelets  Dorothe Pea, PharmD, BCPS Clinical Pharmacist   12/27/2020,10:04 AM

## 2020-12-27 NOTE — Progress Notes (Signed)
PROGRESS NOTE    Yvonne Shaw  QJF:[id number] DOB: 1950/07/20 DOA: 12/26/2020 PCP: System, Provider Not In  ED31A/ED31A   Assessment & Plan:   Principal Problem:   Chest pain Active Problems:   HTN (hypertension)   Diabetes mellitus (HCC)    Yvonne Shaw is a 70 y.o.  female with medical history significant for CAD and NSTEMI s/p stent in 2015, DM2, CVA who presented with chest pain associated with dyspnea.     # NSTEMI --trop 97 and peaked at 1669 --started on heparin gtt on presentation. PLAN:  --cardiology consult today --plan for heart cath tomorrow --cont heparin gtt --Echo  # Hx of CAD s/p stent --cont home ASA and plavix --cont statin  # HTN --cont amlodipine, Lisinopril and Lopressor  # DM2 --last A1c 6.2 about 3 months ago --consider d/c home glipizide at discharge.   DVT prophylaxis: [id number] gtt Code Status: Full code  Family Communication:  Level of care: Progressive Dispo:   The patient is from: home Anticipated d/c is to: home Anticipated d/c date is: 1-2 days Patient currently is not medically ready to d/c due to: pending heart cath   Subjective and Interval History:  Pt reported chest pain and dyspnea resolved.     Objective: Vitals:   12/27/20 1930 12/27/20 2000 12/27/20 2030 12/27/20 2100  BP: (!) 125/54 (!) 142/58 (!) 149/87 (!) 119/53  Pulse: (!) 58 (!) 58 (!) 54 (!) 55  Resp: 16 16 15 17   Temp:      TempSrc:      SpO2: 94% 93% 99% 98%  Weight:      Height:        Intake/Output Summary (Last 24 hours) at 12/28/2020 0006 Last data filed at 12/27/2020 0658 Gross per 24 hour  Intake 326.67 ml  Output --  Net 326.67 ml   Filed Weights   12/26/20 1950  Weight: 100.7 kg    Examination:   Constitutional: NAD, AAOx3 HEENT: conjunctivae and lids normal, EOMI CV: No cyanosis.   RESP: normal respiratory effort, on RA Extremities: No effusions, edema in BLE SKIN: warm, dry Neuro: II - XII grossly intact.   Psych:  Normal mood and affect.  Appropriate judgement and reason   Data Reviewed: I have personally reviewed following labs and imaging studies  CBC: Recent Labs  Lab 12/26/20 1953  WBC 9.3  HGB 12.8  HCT 38.7  MCV 82.9  PLT 681   Basic Metabolic Panel: Recent Labs  Lab 12/26/20 1953  NA 142  K 3.5  CL 110  CO2 27  GLUCOSE 80  BUN 22  CREATININE 1.18*  CALCIUM 9.1   GFR: Estimated Creatinine Clearance: 53.9 mL/min (A) (by C-G formula based on SCr of 1.18 mg/dL (H)). Liver Function Tests: No results for input(s): AST, ALT, ALKPHOS, BILITOT, PROT, ALBUMIN in the last 168 hours. No results for input(s): LIPASE, AMYLASE in the last 168 hours. No results for input(s): AMMONIA in the last 168 hours. Coagulation Profile: Recent Labs  Lab 12/26/20 1953  INR 1.0   Cardiac Enzymes: No results for input(s): CKTOTAL, CKMB, CKMBINDEX, TROPONINI in the last 168 hours. BNP (last 3 results) No results for input(s): PROBNP in the last 8760 hours. HbA1C: No results for input(s): HGBA1C in the last 72 hours. CBG: No results for input(s): GLUCAP in the last 168 hours. Lipid Profile: No results for input(s): CHOL, HDL, LDLCALC, TRIG, CHOLHDL, LDLDIRECT in the last 72 hours. Thyroid Function Tests: No results for input(s):  TSH, T4TOTAL, FREET4, T3FREE, THYROIDAB in the last 72 hours. Anemia Panel: No results for input(s): VITAMINB12, FOLATE, FERRITIN, TIBC, IRON, RETICCTPCT in the last 72 hours. Sepsis Labs: No results for input(s): PROCALCITON, LATICACIDVEN in the last 168 hours.  Recent Results (from the past 240 hour(s))  Resp Panel by RT-PCR (Flu A&B, Covid) Nasopharyngeal Swab     Status: None   Collection Time: 12/26/20  9:57 PM   Specimen: Nasopharyngeal Swab; Nasopharyngeal(NP) swabs in vial transport medium  Result Value Ref Range Status   SARS Coronavirus 2 by RT PCR NEGATIVE NEGATIVE Final    Comment: (NOTE) SARS-CoV-2 target nucleic acids are NOT DETECTED.  The  SARS-CoV-2 RNA is generally detectable in upper respiratory specimens during the acute phase of infection. The lowest concentration of SARS-CoV-2 viral copies this assay can detect is 138 copies/mL. A negative result does not preclude SARS-Cov-2 infection and should not be used as the sole basis for treatment or other patient management decisions. A negative result may occur with  improper specimen collection/handling, submission of specimen other than nasopharyngeal swab, presence of viral mutation(s) within the areas targeted by this assay, and inadequate number of viral copies(<138 copies/mL). A negative result must be combined with clinical observations, patient history, and epidemiological information. The expected result is Negative.  Fact Sheet for Patients:  EntrepreneurPulse.com.au  Fact Sheet for Healthcare Providers:  IncredibleEmployment.be  This test is no t yet approved or cleared by the [place] FDA and  has been authorized for detection and/or diagnosis of SARS-CoV-2 by FDA under an Emergency Use Authorization (EUA). This EUA will remain  in effect (meaning this test can be used) for the duration of the COVID-19 declaration under Section 564(b)(1) of the Act, 21 U.S.C.section 360bbb-3(b)(1), unless the authorization is terminated  or revoked sooner.       Influenza A by PCR NEGATIVE NEGATIVE Final   Influenza B by PCR NEGATIVE NEGATIVE Final    Comment: (NOTE) The Xpert Xpress SARS-CoV-2/FLU/RSV plus assay is intended as an aid in the diagnosis of influenza from Nasopharyngeal swab specimens and should not be used as a sole basis for treatment. Nasal washings and aspirates are unacceptable for Xpert Xpress SARS-CoV-2/FLU/RSV testing.  Fact Sheet for Patients: EntrepreneurPulse.com.au  Fact Sheet for Healthcare Providers: IncredibleEmployment.be  This test is not yet approved or  cleared by the [place] FDA and has been authorized for detection and/or diagnosis of SARS-CoV-2 by FDA under an Emergency Use Authorization (EUA). This EUA will remain in effect (meaning this test can be used) for the duration of the COVID-19 declaration under Section 564(b)(1) of the Act, 21 U.S.C. section 360bbb-3(b)(1), unless the authorization is terminated or revoked.  Performed at [facility], [address] [place],  [address]       Radiology Studies: DG Chest 2 View  Result Date: 12/26/2020 CLINICAL DATA:  Chest pain EXAM: CHEST - 2 VIEW COMPARISON:  03/19/2013 FINDINGS: Cardiac shadow is mildly enlarged. Aortic calcifications are noted. Lungs are clear bilaterally. No focal infiltrate or effusion is seen. No bony abnormality is noted. IMPRESSION: No active cardiopulmonary disease. Electronically Signed   By: Inez Catalina M.D.   On: 12/26/2020 20:32   CT Angio Chest PE W and/or Wo Contrast  Result Date: 12/27/2020 CLINICAL DATA:  Chest pain. EXAM: CT ANGIOGRAPHY CHEST WITH CONTRAST TECHNIQUE: Multidetector CT imaging of the chest was performed using the standard protocol during bolus administration of intravenous contrast. Multiplanar CT image reconstructions and MIPs were obtained to  evaluate the vascular anatomy. CONTRAST:  37mL OMNIPAQUE IOHEXOL 350 MG/ML SOLN COMPARISON:  None. FINDINGS: Cardiovascular: There is mild to moderate severity calcification of the aortic arch, without evidence of aortic aneurysm. Satisfactory opacification of the pulmonary arteries to the segmental level. No evidence of pulmonary embolism. There is mild cardiomegaly. No pericardial effusion. Mediastinum/Nodes: No enlarged mediastinal, hilar, or axillary lymph nodes. Thyroid gland, trachea, and esophagus demonstrate no significant findings. Lungs/Pleura: Very mild linear atelectasis is seen within the left lower lobe. There is no evidence of an acute infiltrate, pleural effusion  or pneumothorax. Upper Abdomen: Multiple surgical clips are seen within the gallbladder fossa. A 3.1 cm x 2.7 cm cyst is seen within the anterior aspect of the right lobe of the liver. A 1.9 cm diameter simple cyst is noted within the upper pole of the left kidney. Musculoskeletal: No chest wall abnormality. No acute or significant osseous findings. Review of the MIP images confirms the above findings. IMPRESSION: 1. No evidence of pulmonary embolism or other acute intrathoracic process. 2. Mild cardiomegaly. 3. Hepatic and left renal cysts. 4. Aortic atherosclerosis. Aortic Atherosclerosis (ICD10-I70.0). Electronically Signed   By: Virgina Norfolk M.D.   On: 12/27/2020 00:19     Scheduled Meds:  amLODipine  10 mg Oral Daily   aspirin  81 mg Oral Pre-Cath   aspirin EC  81 mg Oral Daily   atorvastatin  80 mg Oral q1800   clopidogrel  75 mg Oral Daily   lisinopril  20 mg Oral Daily   metoprolol tartrate  25 mg Oral Daily   Continuous Infusions:  sodium chloride     Followed by   sodium chloride     heparin 900 Units/hr (12/27/20 1910)     LOS: 2 days     Enzo Bi, MD [facility] If 7PM-7AM, please contact night-coverage 12/28/2020, 12:06 AM

## 2020-12-28 ENCOUNTER — Other Ambulatory Visit: Payer: Self-pay

## 2020-12-28 ENCOUNTER — Encounter
Admission: EM | Disposition: A | Discharge status: Home / Self Care | Payer: Self-pay | Source: Home / Self Care | Attending: Hospitalist

## 2020-12-28 ENCOUNTER — Inpatient Hospital Stay (HOSPITAL_COMMUNITY)
Admit: 2020-12-28 | Discharge: 2020-12-28 | Disposition: A | Discharge status: Home / Self Care | Payer: Medicare HMO | Attending: Cardiovascular Disease | Admitting: Cardiovascular Disease

## 2020-12-28 DIAGNOSIS — I214 Non-ST elevation (NSTEMI) myocardial infarction: Secondary | ICD-10-CM

## 2020-12-28 DIAGNOSIS — I2510 Atherosclerotic heart disease of native coronary artery without angina pectoris: Secondary | ICD-10-CM

## 2020-12-28 HISTORY — PX: CORONARY STENT INTERVENTION: CATH118234

## 2020-12-28 HISTORY — PX: LEFT HEART CATH AND CORONARY ANGIOGRAPHY: CATH118249

## 2020-12-28 LAB — BASIC METABOLIC PANEL
Anion gap: 6 (ref 5–15)
BUN: 18 mg/dL (ref 8–23)
CO2: 24 mmol/L (ref 22–32)
Calcium: 9.3 mg/dL (ref 8.9–10.3)
Chloride: 110 mmol/L (ref 98–111)
Creatinine, Ser: 1.24 mg/dL — ABNORMAL HIGH (ref 0.44–1.00)
GFR, Estimated: 47 mL/min — ABNORMAL LOW (ref >60)
Glucose, Bld: 102 mg/dL — ABNORMAL HIGH (ref 70–99)
Potassium: 3.9 mmol/L (ref 3.5–5.1)
Sodium: 140 mmol/L (ref 135–145)

## 2020-12-28 LAB — CBC
HCT: 41.8 % (ref 36.0–46.0)
Hemoglobin: 13.9 g/dL (ref 12.0–15.0)
MCH: 27.3 pg (ref 26.0–34.0)
MCHC: 33.3 g/dL (ref 30.0–36.0)
MCV: 82.1 fL (ref 80.0–100.0)
Platelets: 267 10*3/uL (ref 150–400)
RBC: 5.09 MIL/uL (ref 3.87–5.11)
RDW: 14.6 % (ref 11.5–15.5)
WBC: 8 10*3/uL (ref 4.0–10.5)
nRBC: 0 % (ref 0.0–0.2)

## 2020-12-28 LAB — ECHOCARDIOGRAM COMPLETE
AR max vel: 2.31 cm2
AV Area VTI: 2.05 cm2
AV Area mean vel: 1.99 cm2
AV Mean grad: 3 mmHg
AV Peak grad: 4.8 mmHg
Ao pk vel: 1.1 m/s
Area-P 1/2: 3.02 cm2
Height: 66 in
MV VTI: 1.64 cm2
S' Lateral: 2.9 cm
Weight: 3552 oz

## 2020-12-28 LAB — GLUCOSE, CAPILLARY: Glucose-Capillary: 92 mg/dL (ref 70–99)

## 2020-12-28 LAB — HEPARIN LEVEL (UNFRACTIONATED): Heparin Unfractionated: 0.32 IU/mL (ref 0.30–0.70)

## 2020-12-28 LAB — POCT ACTIVATED CLOTTING TIME
Activated Clotting Time: 342 seconds
Activated Clotting Time: 293 seconds

## 2020-12-28 LAB — MAGNESIUM: Magnesium: 2.1 mg/dL (ref 1.7–2.4)

## 2020-12-28 SURGERY — LEFT HEART CATH AND CORONARY ANGIOGRAPHY
Anesthesia: Moderate Sedation

## 2020-12-28 MED ORDER — CLOPIDOGREL BISULFATE 75 MG PO TABS
ORAL_TABLET | ORAL | Status: AC
  Filled 2020-12-28: qty 4

## 2020-12-28 MED ORDER — CLOPIDOGREL BISULFATE 75 MG PO TABS
ORAL_TABLET | ORAL | Status: DC | PRN
  Administered 2020-12-28: 300 mg via ORAL

## 2020-12-28 MED ORDER — HEPARIN (PORCINE) IN NACL 1000-0.9 UT/500ML-% IV SOLN
INTRAVENOUS | Status: AC
  Filled 2020-12-28: qty 1000

## 2020-12-28 MED ORDER — HEPARIN (PORCINE) IN NACL 2000-0.9 UNIT/L-% IV SOLN
INTRAVENOUS | Status: DC | PRN
  Administered 2020-12-28: 1000 mL

## 2020-12-28 MED ORDER — SODIUM CHLORIDE 0.9 % IV SOLN: 250.0000 mL | INTRAVENOUS | Status: DC | PRN

## 2020-12-28 MED ORDER — CLOPIDOGREL BISULFATE 75 MG PO TABS
75.0000 mg | ORAL_TABLET | Freq: Every day | ORAL | 3 refills | Status: AC
Start: 2020-12-28 — End: 2021-12-28

## 2020-12-28 MED ORDER — FENTANYL CITRATE (PF) 100 MCG/2ML IJ SOLN
INTRAMUSCULAR | Status: DC | PRN
  Administered 2020-12-28 (×2): 25 ug via INTRAVENOUS

## 2020-12-28 MED ORDER — METOPROLOL TARTRATE 25 MG PO TABS: ORAL_TABLET | ORAL | Status: DC

## 2020-12-28 MED ORDER — MIDAZOLAM HCL 2 MG/2ML IJ SOLN
INTRAMUSCULAR | Status: DC | PRN
  Administered 2020-12-28 (×2): 1 mg via INTRAVENOUS

## 2020-12-28 MED ORDER — SODIUM CHLORIDE 0.9% FLUSH: 3.0000 mL | Freq: Two times a day (BID) | INTRAVENOUS | Status: DC

## 2020-12-28 MED ORDER — FENTANYL CITRATE (PF) 100 MCG/2ML IJ SOLN
INTRAMUSCULAR | Status: AC
  Filled 2020-12-28: qty 2

## 2020-12-28 MED ORDER — MIDAZOLAM HCL 2 MG/2ML IJ SOLN
INTRAMUSCULAR | Status: AC
  Filled 2020-12-28: qty 2

## 2020-12-28 MED ORDER — SODIUM CHLORIDE 0.9% FLUSH: 3.0000 mL | INTRAVENOUS | Status: DC | PRN

## 2020-12-28 MED ORDER — NITROGLYCERIN 1 MG/10 ML FOR IR/CATH LAB
INTRA_ARTERIAL | Status: DC | PRN
  Administered 2020-12-28: 200 ug via INTRACORONARY

## 2020-12-28 MED ORDER — VERAPAMIL HCL 2.5 MG/ML IV SOLN
INTRAVENOUS | Status: AC
  Filled 2020-12-28: qty 2

## 2020-12-28 MED ORDER — SODIUM CHLORIDE 0.9 % IV SOLN: INTRAVENOUS | Status: DC

## 2020-12-28 MED ORDER — HEPARIN SODIUM (PORCINE) 1000 UNIT/ML IJ SOLN
INTRAMUSCULAR | Status: AC
  Filled 2020-12-28: qty 10

## 2020-12-28 MED ORDER — VERAPAMIL HCL 2.5 MG/ML IV SOLN
INTRAVENOUS | Status: DC | PRN
  Administered 2020-12-28: 2.5 mg via INTRA_ARTERIAL

## 2020-12-28 MED ORDER — IOHEXOL 300 MG/ML  SOLN
INTRAMUSCULAR | Status: DC | PRN
  Administered 2020-12-28: 09:00:00 170 mL

## 2020-12-28 MED ORDER — LIDOCAINE HCL 1 % IJ SOLN
INTRAMUSCULAR | Status: AC
  Filled 2020-12-28: qty 20

## 2020-12-28 MED ORDER — HEPARIN SODIUM (PORCINE) 1000 UNIT/ML IJ SOLN
INTRAMUSCULAR | Status: DC | PRN
  Administered 2020-12-28 (×2): 5000 [IU] via INTRAVENOUS

## 2020-12-28 MED ORDER — LIDOCAINE HCL (PF) 1 % IJ SOLN
INTRAMUSCULAR | Status: DC | PRN
  Administered 2020-12-28: 2 mL

## 2020-12-28 SURGICAL SUPPLY — 22 items
BALLN EUPHORA RX 2.0X12 (BALLOONS) ×3
BALLN ~~LOC~~ TREK RX 2.5X12 (BALLOONS) ×3
BALLOON EUPHORA RX 2.0X12 (BALLOONS) IMPLANT
BALLOON ~~LOC~~ TREK RX 2.5X12 (BALLOONS) IMPLANT
CATH INFINITI 5FR JK (CATHETERS) ×2 IMPLANT
CATH LAUNCHER 6FR EBU3.5 (CATHETERS) ×2 IMPLANT
CATH LAUNCHER 6FR JL3.5 (CATHETERS) ×2 IMPLANT
DEVICE RAD TR BAND REGULAR (VASCULAR PRODUCTS) ×2 IMPLANT
DRAPE BRACHIAL (DRAPES) ×2 IMPLANT
GLIDESHEATH SLEND SS 6F .021 (SHEATH) ×2 IMPLANT
GUIDEWIRE INQWIRE 1.5J.035X260 (WIRE) IMPLANT
INQWIRE 1.5J .035X260CM (WIRE) ×3
KIT ENCORE 26 ADVANTAGE (KITS) ×2 IMPLANT
KIT SYRINGE INJ CVI SPIKEX1 (MISCELLANEOUS) ×2 IMPLANT
PACK CARDIAC CATH (CUSTOM PROCEDURE TRAY) ×3 IMPLANT
PANNUS RETENTION SYSTEM 2 PAD (MISCELLANEOUS) ×2 IMPLANT
PROTECTION STATION PRESSURIZED (MISCELLANEOUS) ×3
SET ATX SIMPLICITY (MISCELLANEOUS) ×2 IMPLANT
STATION PROTECTION PRESSURIZED (MISCELLANEOUS) IMPLANT
STENT ONYX FRONTIER 2.5X18 (Permanent Stent) ×2 IMPLANT
TUBING CIL FLEX 10 FLL-RA (TUBING) ×2 IMPLANT
WIRE RUNTHROUGH .014X180CM (WIRE) ×2 IMPLANT

## 2020-12-28 NOTE — Progress Notes (Signed)
*  PRELIMINARY RESULTS* Echocardiogram 2D Echocardiogram has been performed.  Yvonne Shaw 12/28/2020, 12:06 PM

## 2020-12-28 NOTE — Progress Notes (Signed)
ANTICOAGULATION CONSULT NOTE  Pharmacy Consult for Heparin  Indication: chest pain/ACS  Allergies  Allergen Reactions   Aggrenox [Aspirin-Dipyridamole Er] Other (See Comments), Nausea Only and Nausea And Vomiting    Severe headache   Carvedilol Other (See Comments)    caused fatigue    Patient Measurements: Height: 5\' 6"  (167.6 cm) Weight: 100.7 kg (222 lb) IBW/kg (Calculated) : 59.3 Heparin Dosing Weight: 82.1 kg   Vital Signs: BP: 133/52 (12/23 0130) Pulse Rate: 58 (12/23 0130)  Labs: Recent Labs    12/26/20 1953 12/26/20 2152 12/27/20 0829 12/27/20 1313 12/27/20 1603 12/27/20 1741 12/28/20 0322  HGB 12.8  --   --   --   --   --   --   HCT 38.7  --   --   --   --   --   --   PLT 241  --   --   --   --   --   --   APTT 31  --   --   --   --   --   --   LABPROT 13.0  --   --   --   --   --   --   INR 1.0  --   --   --   --   --   --   HEPARINUNFRC  --   --  >1.10*  --   --  0.20* 0.32  CREATININE 1.18*  --   --   --   --   --   --   TROPONINIHS 97* 400*  --  1,669* 1,513*  --   --      Estimated Creatinine Clearance: 53.9 mL/min (A) (by C-G formula based on SCr of 1.18 mg/dL (H)).   Medical History: Past Medical History:  Diagnosis Date   Abnormal CT scan, colon 05/21/2015   Anginal pain (HCC)    CAD S/P percutaneous coronary angioplasty 03/19/2013   s/p 2.25 mm x 15 mm (2.46) DES to mid AV Groove Circumflex   CKD (chronic kidney disease)    CVA (cerebral infarction)    Residual R leg weakness   Diabetes mellitus type 2 in obese (HCC)    Fibroid    GERD (gastroesophageal reflux disease)    Hyperlipidemia LDL goal <70    Hypertension    Nonsustained ventricular tachycardia    found on exam monitoring, asymptomatic   Obesity    Orthopnea    PVC's (premature ventricular contractions)    seen on telemetry during cardiac rehabilitation   Shortness of breath dyspnea    due to brilinta   ST elevation myocardial infarction (STEMI) of inferolateral wall,  subsequent episode of care ([facility]) 03/19/2013   s/p DES to mid AV Groove Circumflex; EF 50% on cath   Stroke [facility])    2001     Assessment: Pharmacy consulted to dose heparin in this 70 year old female admitted with ACS/NSTEMI.  No prior anticoag noted.   CrCl = 53.9 ml/min   12/22 0829 HL > 1.10 1000 > 750 units/hr 12/22 1741 HL 0.20 12/23 0322 HL 0.32, therapeutic X 1   Goal of Therapy:  Heparin level 0.3-0.7 units/ml Monitor platelets by anticoagulation protocol: Yes   Plan:  12/23:  HL @ 0322 = 0.32, therapeutic X 1  Will continue pt on current rate and recheck HL on 12/23 @ 0900.   Orene Desanctis, PharmD Clinical Pharmacist   12/28/2020,4:03 AM

## 2020-12-28 NOTE — Progress Notes (Signed)
°  Transition of Care [facility]) Screening Note   Patient Details  Name: Yvonne Shaw Date of Birth: [birth year]   Transition of Care [facility]) CM/SW Contact:    Alberteen Sam, LCSW Phone Number: 12/28/2020, 12:58 PM    Transition of Care Department [facility]) has reviewed patient and no TOC needs have been identified at this time. We will continue to monitor patient advancement through interdisciplinary progression rounds. If new patient transition needs arise, please place a TOC consult.

## 2020-12-28 NOTE — CV Procedure (Signed)
Cardiac catheterization done by the right radial artery.  It showed 99% proximal edge restenosis in the previously placed left circumflex stent with no other obstructive disease.  This was treated successfully with PCI and drug-eluting stent placement to overlap the previously placed stent. The patient is already on dual antiplatelet therapy. Continue aggressive treatment of risk factors. The patient can be discharged home in the afternoon after 3 PM if she remains stable.  Following cath report to follow.

## 2020-12-28 NOTE — Interval H&P Note (Signed)
Cath Lab Visit (complete for each Cath Lab visit)  Clinical Evaluation Leading to the Procedure:   ACS: Yes.    Non-ACS:  n/a   History and Physical Interval Note:  12/28/2020 7:55 AM  Yvonne Shaw  has presented today for surgery, with the diagnosis of Unstable angina, non-STEMI.  The various methods of treatment have been discussed with the patient and family. After consideration of risks, benefits and other options for treatment, the patient has consented to  Procedure(s): LEFT HEART CATH AND CORONARY ANGIOGRAPHY possible percutaneous intervention (N/A) as a surgical intervention.  The patient's history has been reviewed, patient examined, no change in status, stable for surgery.  I have reviewed the patient's chart and labs.  Questions were answered to the patient's satisfaction.     Kathlyn [place]

## 2020-12-28 NOTE — Discharge Summary (Addendum)
Physician Discharge Summary   Yvonne Shaw  female DOB: 1950/06/06  SAY:[id number]  PCP: System, Provider Not In  Admit date: 12/26/2020 Discharge date: 12/28/2020  Admitted From: home Disposition:  home CODE STATUS: Full code  Discharge Instructions     AMB Referral to Cardiac Rehabilitation - Phase II   Complete by: As directed    Diagnosis:  Coronary Stents NSTEMI     After initial evaluation and assessments completed: Virtual Based Care may be provided alone or in conjunction with Phase 2 Cardiac Rehab based on patient barriers.: Yes   Discharge instructions   Complete by: As directed    Your previous cardiac stent had blockage, and now it's opened up with another stent.  You will need to take aspirin 81 mg and plavix together for another year.  Please hold your metoprolol until followup with outpatient cardiology because your heart rate has been low.  I have stopped your glipizide, because your A1c is 6.2 and you don't need to take this medication anymore.   Dr. Enzo Bi [facility] Course:  For full details, please see H&P, progress notes, consult notes and ancillary notes.  Briefly,  Yvonne Shaw is a 70 y.o.  female with medical history significant for CAD and NSTEMI s/p stent in 2015, DM2, CVA who presented with chest pain associated with dyspnea.     # NSTEMI --trop 97 and peaked at 1669 --started on heparin gtt on presentation. --heart cath showed 99% proximal edge restenosis in the previously placed left circumflex stent with no other obstructive disease.  This was treated successfully with PCI and drug-eluting stent placement to overlap the previously placed stent. --cont ASA and plavix for another year.   # Hx of CAD s/p stent --cont home ASA and plavix for another year --cont statin   # HTN --cont amlodipine, Lisinopril  --metop held due to low HR   # DM2 --last A1c 6.2 about 3 months ago --home glipizide d/c'ed at discharge.  #  CKD 3a   Discharge Diagnoses:  Principal Problem:   Chest pain Active Problems:   NSTEMI (non-ST elevated myocardial infarction) ([place])   HTN (hypertension)   Diabetes mellitus ([place])   30 Day Unplanned Readmission Risk Score    Flowsheet Row ED to Hosp-Admission (Current) from 12/26/2020 in [facility] CATH LAB  30 Day Unplanned Readmission Risk Score (%) 9.91 Filed at 12/28/2020 0801       This score is the patient's risk of an unplanned readmission within 30 days of being discharged (0 -100%). The score is based on dignosis, age, lab data, medications, orders, and past utilization.   Low:  0-14.9   Medium: 15-21.9   High: 22-29.9   Extreme: 30 and above         Discharge Instructions:  Allergies as of 12/28/2020       Reactions   Aggrenox [aspirin-dipyridamole Er] Other (See Comments), Nausea Only, Nausea And Vomiting   Severe headache   Carvedilol Other (See Comments)   caused fatigue        Medication List     STOP taking these medications    ergocalciferol 1.25 MG (50000 UT) capsule Commonly known as: VITAMIN D2   glipiZIDE 2.5 MG 24 hr tablet Commonly known as: GLUCOTROL XL       TAKE these medications    amLODipine 10 MG tablet Commonly known as: NORVASC Take 1 tablet by mouth  daily.   aspirin 81 MG tablet Take 81 mg by mouth daily.   atorvastatin 80 MG tablet Commonly known as: LIPITOR Take 1 tablet (80 mg total) by mouth daily at 6 PM.   clopidogrel 75 MG tablet Commonly known as: PLAVIX Take 1 tablet (75 mg total) by mouth daily.   lisinopril 20 MG tablet Commonly known as: ZESTRIL Take 20 mg by mouth daily.   metoprolol tartrate 25 MG tablet Commonly known as: LOPRESSOR Hold due to low heart rate. What changed:  how much to take how to take this when to take this additional instructions   nitroGLYCERIN 0.4 MG SL tablet Commonly known as: NITROSTAT Place 1 tablet (0.4 mg total) under the tongue  every 5 (five) minutes x 3 doses as needed for chest pain.   ONE TOUCH ULTRA SYSTEM KIT w/Device Kit 1 kit by Does not apply route once.   Vitamin D 50 MCG (2000 UT) Caps Take 6,000 Units by mouth daily.         Follow-up Information     Lorretta Harp, MD Follow up in 1 week(s).   Specialties: Cardiology, Radiology Contact information: [address] [place] [address] [phone]         Minna Merritts, MD Follow up in 2 week(s).   Specialty: Cardiology Contact information: [place] [address] [phone]                 Allergies  Allergen Reactions   Aggrenox [Aspirin-Dipyridamole Er] Other (See Comments), Nausea Only and Nausea And Vomiting    Severe headache   Carvedilol Other (See Comments)    caused fatigue     The results of significant diagnostics from this hospitalization (including imaging, microbiology, ancillary and laboratory) are listed below for reference.   Consultations:   Procedures/Studies: DG Chest 2 View  Result Date: 12/26/2020 CLINICAL DATA:  Chest pain EXAM: CHEST - 2 VIEW COMPARISON:  03/19/2013 FINDINGS: Cardiac shadow is mildly enlarged. Aortic calcifications are noted. Lungs are clear bilaterally. No focal infiltrate or effusion is seen. No bony abnormality is noted. IMPRESSION: No active cardiopulmonary disease. Electronically Signed   By: Inez Catalina M.D.   On: 12/26/2020 20:32   CT Angio Chest PE W and/or Wo Contrast  Result Date: 12/27/2020 CLINICAL DATA:  Chest pain. EXAM: CT ANGIOGRAPHY CHEST WITH CONTRAST TECHNIQUE: Multidetector CT imaging of the chest was performed using the standard protocol during bolus administration of intravenous contrast. Multiplanar CT image reconstructions and MIPs were obtained to evaluate the vascular anatomy. CONTRAST:  3m OMNIPAQUE IOHEXOL 350 MG/ML SOLN COMPARISON:  None. FINDINGS: Cardiovascular: There is mild to moderate  severity calcification of the aortic arch, without evidence of aortic aneurysm. Satisfactory opacification of the pulmonary arteries to the segmental level. No evidence of pulmonary embolism. There is mild cardiomegaly. No pericardial effusion. Mediastinum/Nodes: No enlarged mediastinal, hilar, or axillary lymph nodes. Thyroid gland, trachea, and esophagus demonstrate no significant findings. Lungs/Pleura: Very mild linear atelectasis is seen within the left lower lobe. There is no evidence of an acute infiltrate, pleural effusion or pneumothorax. Upper Abdomen: Multiple surgical clips are seen within the gallbladder fossa. A 3.1 cm x 2.7 cm cyst is seen within the anterior aspect of the right lobe of the liver. A 1.9 cm diameter simple cyst is noted within the upper pole of the left kidney. Musculoskeletal: No chest wall abnormality. No acute or significant osseous findings. Review of the MIP images  confirms the above findings. IMPRESSION: 1. No evidence of pulmonary embolism or other acute intrathoracic process. 2. Mild cardiomegaly. 3. Hepatic and left renal cysts. 4. Aortic atherosclerosis. Aortic Atherosclerosis (ICD10-I70.0). Electronically Signed   By: Virgina Norfolk M.D.   On: 12/27/2020 00:19   CARDIAC CATHETERIZATION  Result Date: 12/28/2020   Prox Cx to Mid Cx lesion is 99% stenosed.   Prox RCA lesion is 20% stenosed.   Previously placed Mid Cx stent (unknown type) is  widely patent.   A drug-eluting stent was successfully placed using a STENT ONYX FRONTIER 2.5X18.   Post intervention, there is a 0% residual stenosis.   The left ventricular systolic function is normal.   LV end diastolic pressure is mildly elevated.   The left ventricular ejection fraction is 55-65% by visual estimate. 1.  Significant one-vessel coronary artery disease with severe proximal edge restenosis of the previously placed left circumflex stent.  No other obstructive disease. 2.  Normal LV systolic function with mildly  elevated left ventricular end-diastolic pressure at 15 mmHg. 3.  Successful balloon angioplasty and drug-eluting stent placement to overlap with the previously placed stent. Recommendations: Continue dual antiplatelet therapy. Continue aggressive treatment of risk factors. The patient is a candidate for discharge in the afternoon if no issues.      Labs: BNP (last 3 results) No results for input(s): BNP in the last 8760 hours. Basic Metabolic Panel: Recent Labs  Lab 12/26/20 1953 12/28/20 0634  NA 142 140  K 3.5 3.9  CL 110 110  CO2 27 24  GLUCOSE 80 102*  BUN 22 18  CREATININE 1.18* 1.24*  CALCIUM 9.1 9.3  MG  --  2.1   Liver Function Tests: No results for input(s): AST, ALT, ALKPHOS, BILITOT, PROT, ALBUMIN in the last 168 hours. No results for input(s): LIPASE, AMYLASE in the last 168 hours. No results for input(s): AMMONIA in the last 168 hours. CBC: Recent Labs  Lab 12/26/20 1953 12/28/20 0634  WBC 9.3 8.0  HGB 12.8 13.9  HCT 38.7 41.8  MCV 82.9 82.1  PLT 241 267   Cardiac Enzymes: No results for input(s): CKTOTAL, CKMB, CKMBINDEX, TROPONINI in the last 168 hours. BNP: Invalid input(s): POCBNP CBG: Recent Labs  Lab 12/28/20 0724  GLUCAP 92   D-Dimer No results for input(s): DDIMER in the last 72 hours. Hgb A1c No results for input(s): HGBA1C in the last 72 hours. Lipid Profile No results for input(s): CHOL, HDL, LDLCALC, TRIG, CHOLHDL, LDLDIRECT in the last 72 hours. Thyroid function studies No results for input(s): TSH, T4TOTAL, T3FREE, THYROIDAB in the last 72 hours.  Invalid input(s): FREET3 Anemia work up No results for input(s): VITAMINB12, FOLATE, FERRITIN, TIBC, IRON, RETICCTPCT in the last 72 hours. Urinalysis    Component Value Date/Time   COLORURINE YELLOW (A) 04/21/2016 1643   APPEARANCEUR CLEAR (A) 04/21/2016 1643   LABSPEC 1.014 04/21/2016 1643   PHURINE 5.0 04/21/2016 1643   GLUCOSEU NEGATIVE 04/21/2016 1643   HGBUR NEGATIVE  04/21/2016 1643   BILIRUBINUR NEGATIVE 04/21/2016 1643   KETONESUR NEGATIVE 04/21/2016 1643   PROTEINUR NEGATIVE 04/21/2016 1643   NITRITE NEGATIVE 04/21/2016 1643   LEUKOCYTESUR NEGATIVE 04/21/2016 1643   Sepsis Labs Invalid input(s): PROCALCITONIN,  WBC,  LACTICIDVEN Microbiology Recent Results (from the past 240 hour(s))  Resp Panel by RT-PCR (Flu A&B, Covid) Nasopharyngeal Swab     Status: None   Collection Time: 12/26/20  9:57 PM   Specimen: Nasopharyngeal Swab; Nasopharyngeal(NP) swabs in vial transport medium  Result Value Ref Range Status   SARS Coronavirus 2 by RT PCR NEGATIVE NEGATIVE Final    Comment: (NOTE) SARS-CoV-2 target nucleic acids are NOT DETECTED.  The SARS-CoV-2 RNA is generally detectable in upper respiratory specimens during the acute phase of infection. The lowest concentration of SARS-CoV-2 viral copies this assay can detect is 138 copies/mL. A negative result does not preclude SARS-Cov-2 infection and should not be used as the sole basis for treatment or other patient management decisions. A negative result may occur with  improper specimen collection/handling, submission of specimen other than nasopharyngeal swab, presence of viral mutation(s) within the areas targeted by this assay, and inadequate number of viral copies(<138 copies/mL). A negative result must be combined with clinical observations, patient history, and epidemiological information. The expected result is Negative.  Fact Sheet for Patients:  EntrepreneurPulse.com.au  Fact Sheet for Healthcare Providers:  IncredibleEmployment.be  This test is no t yet approved or cleared by the [place] FDA and  has been authorized for detection and/or diagnosis of SARS-CoV-2 by FDA under an Emergency Use Authorization (EUA). This EUA will remain  in effect (meaning this test can be used) for the duration of the COVID-19 declaration under Section 564(b)(1) of  the Act, 21 U.S.C.section 360bbb-3(b)(1), unless the authorization is terminated  or revoked sooner.       Influenza A by PCR NEGATIVE NEGATIVE Final   Influenza B by PCR NEGATIVE NEGATIVE Final    Comment: (NOTE) The Xpert Xpress SARS-CoV-2/FLU/RSV plus assay is intended as an aid in the diagnosis of influenza from Nasopharyngeal swab specimens and should not be used as a sole basis for treatment. Nasal washings and aspirates are unacceptable for Xpert Xpress SARS-CoV-2/FLU/RSV testing.  Fact Sheet for Patients: EntrepreneurPulse.com.au  Fact Sheet for Healthcare Providers: IncredibleEmployment.be  This test is not yet approved or cleared by the [place] FDA and has been authorized for detection and/or diagnosis of SARS-CoV-2 by FDA under an Emergency Use Authorization (EUA). This EUA will remain in effect (meaning this test can be used) for the duration of the COVID-19 declaration under Section 564(b)(1) of the Act, 21 U.S.C. section 360bbb-3(b)(1), unless the authorization is terminated or revoked.  Performed at [facility], [address] [place], [place] [address]      Total time spend on discharging this patient, including the last patient exam, discussing the hospital stay, instructions for ongoing care as it relates to all pertinent caregivers, as well as preparing the medical discharge records, prescriptions, and/or referrals as applicable, is 30 minutes.    Enzo Bi, MD  [facility] 12/28/2020, 11:43 AM

## 2020-12-28 NOTE — Progress Notes (Signed)
PT ambulatory to restroom with steady gait. Radial site stable.

## 2020-12-28 NOTE — Progress Notes (Signed)
Post bleeding event, pt complained of some numbness at the base of the her right thumb. No hematoma noted. Post bleeding, TR band was manipulated and am now able to feel pulse. Pulse in R radial is 2+. MD made aware of this and he acknowledged this information. Approx 10 min after initial complaint pt states numbness is decreasing. Site still stable at this time.

## 2021-01-01 ENCOUNTER — Encounter: Payer: Self-pay | Admitting: Cardiovascular Disease

## 2021-01-01 ENCOUNTER — Other Ambulatory Visit: Payer: Self-pay | Admitting: *Deleted

## 2021-01-01 DIAGNOSIS — Z955 Presence of coronary angioplasty implant and graft: Secondary | ICD-10-CM

## 2021-01-01 DIAGNOSIS — I214 Non-ST elevation (NSTEMI) myocardial infarction: Secondary | ICD-10-CM

## 2021-01-04 NOTE — Progress Notes (Deleted)
Cardiology Office Note:    Date:  01/04/2021   ID:  Yvonne Shaw, DOB 1950/04/17, MRN [id number]  PCP:  System, Provider Not In  Cardiologist:  Quay Burow, MD  Electrophysiologist:  None   Referring MD: No ref. provider found   Chief Complaint: ***  History of Present Illness:    Yvonne Shaw is a 71 y.o. female with a history of CAD with STEMI in 03/2013 s/p DES to mid LCX and recent NSTEMI in 12/2020 s/p DES to proximal to mid LCX, prior CVA with residual right leg weakness, hypertension, hyperlipidemia, type 2 diabetes mellitus, CKD stage III, and GERD who is followed by Dr. Gwenlyn Found and presents today for hospital follow-up of NSTEMI.  Patient was admitted in 03/2013 with acute STEMI and underwent successful PCI with DES to mid LCX. Patient had done well overall until recently when she was admitted from 12/26/2020 to 12/28/2020 for a NSTEMI after presenting with chest pain with associated dyspnea. High-sensitivity troponin peaked at 1,669. Cardiac catheterization showed 99% stenosis of proximal to mid LCX just proximally to prior LCX stent which was patent. Otherwise, only had minimal disease of the RCA. Patient underwent successful balloon angioplasty and DES to LCX lesion which overlapped previously placed stent. LVEDP was mildly elevated at 15 mmHg. Echo showed LVEF of 60-65% with no regional wall motion abnormalities, mild LVH, grade 1 diastolic dysfunction, and no significant valvular disease. He was started on DAPT with Aspirin and Plavix.  Patient presents today for follow-up. ***  CAD with Recent NSTEMI Patient has a history of CAD with STEMI in 03/2013 s/p DES to mid LCX. He was recently admitted with NSTEMI and underwent balloon angioplasty and DES to LCX lesion which overlapped previously placed stent. - No angina.  - Continue DAPT with Aspirin 81mg  daily and Plavix 75mg  daily. - Continue beta-blocker and high-intensity statin.  Hypertension BP well controlled. - Continue  Lopressor 25mg  twice daily *** and Lisinopril 20mg  daily.   Hyperlipidemia Lipid panel in 09/2020: Total Cholesterol 204, Triglycerides 79, HDL 50, LDL 138. *** LDL goal <55. - Continue Lipitor 80mg  daily. Previously on Zetia 10mg  daily. ***  Type 2 Diabetes Mellitus  Hemoglobin A1c 6.2 in 09/2020. - Glipizide stopped at recent discharge. - Management per PCP.  CKD Stage III Baseline creatinine around 1.1 to 1.4. Stable at 1.24 on recent 12/28/2020.   Past Medical History:  Diagnosis Date   Abnormal CT scan, colon 05/21/2015   Anginal pain (HCC)    CAD S/P percutaneous coronary angioplasty 03/19/2013   s/p 2.25 mm x 15 mm (2.46) DES to mid AV Groove Circumflex   CKD (chronic kidney disease)    CVA (cerebral infarction)    Residual R leg weakness   Diabetes mellitus type 2 in obese (HCC)    Fibroid    GERD (gastroesophageal reflux disease)    Hyperlipidemia LDL goal <70    Hypertension    Nonsustained ventricular tachycardia    found on exam monitoring, asymptomatic   Obesity    Orthopnea    PVC's (premature ventricular contractions)    seen on telemetry during cardiac rehabilitation   Shortness of breath dyspnea    due to brilinta   ST elevation myocardial infarction (STEMI) of inferolateral wall, subsequent episode of care ([place]) 03/19/2013   s/p DES to mid AV Groove Circumflex; EF 50% on cath   Stroke [facility])    2001    Past Surgical History:  Procedure Laterality Date   ABDOMINAL HYSTERECTOMY  CATARACT EXTRACTION W/PHACO Right 09/05/2014   Procedure: CATARACT EXTRACTION PHACO AND INTRAOCULAR LENS PLACEMENT (IOC);  Surgeon: Birder Robson, MD;  Location: [facility] ORS;  Service: Ophthalmology;  Laterality: Right;  [place]: 01:12.2 AP%: 20.1 CDE: 14.45 Lot # [id number]   CHOLECYSTECTOMY     COLONOSCOPY WITH PROPOFOL N/A 07/02/2015   Procedure: COLONOSCOPY WITH PROPOFOL;  Surgeon: Manya Silvas, MD;  Location: [facility] ENDOSCOPY;  Service: Endoscopy;  Laterality: N/A;    COLONOSCOPY WITH PROPOFOL N/A 06/27/2020   Procedure: COLONOSCOPY WITH PROPOFOL;  Surgeon: Robert Bellow, MD;  Location: [facility] ENDOSCOPY;  Service: Endoscopy;  Laterality: N/A;  DM, TAKES PLAVIX   CORONARY STENT INTERVENTION N/A 12/28/2020   Procedure: CORONARY STENT INTERVENTION;  Surgeon: Wellington Hampshire, MD;  Location: [place] CV LAB;  Service: Cardiovascular;  Laterality: N/A;   LEFT HEART CATH AND CORONARY ANGIOGRAPHY N/A 12/28/2020   Procedure: LEFT HEART CATH AND CORONARY ANGIOGRAPHY possible percutaneous intervention;  Surgeon: Wellington Hampshire, MD;  Location: [place] CV LAB;  Service: Cardiovascular;  Laterality: N/A;   LEFT HEART CATHETERIZATION WITH CORONARY ANGIOGRAM N/A 03/19/2013   Procedure: LEFT HEART CATHETERIZATION WITH CORONARY ANGIOGRAM;  Surgeon: Lorretta Harp, MD;  Location: [facility] CATH LAB;  Service: Cardiovascular;  Laterality: N/A;   PERCUTANEOUS CORONARY STENT INTERVENTION (PCI-S)  03/19/2013   PCI to mCx - 90% lesion: 2.25 mm x 15 mm Xience [place] DES (2.46 mm post-dilation); EF ~50%    Current Medications: No outpatient medications have been marked as taking for the 01/08/21 encounter (Appointment) with Darreld Mclean, PA-C.     Allergies:   Aggrenox [aspirin-dipyridamole er] and Carvedilol   Social History   Socioeconomic History   Marital status: Married    Spouse name: Not on file   Number of children: Not on file   Years of education: Not on file   Highest education level: Not on file  Occupational History   Not on file  Tobacco Use   Smoking status: Never   Smokeless tobacco: Never  Vaping Use   Vaping Use: Never used  Substance and Sexual Activity   Alcohol use: No   Drug use: No   Sexual activity: Not on file  Other Topics Concern   Not on file  Social History Narrative   Married, 2 healthy children. Lives in [place], [place]. Works at [employer].   Social Determinants of Health   Financial Resource Strain: Not on file  Food  Insecurity: Not on file  Transportation Needs: Not on file  Physical Activity: Not on file  Stress: Not on file  Social Connections: Not on file     Family History: The patient's family history includes Cancer in her father and mother; Diabetes in her brother, father, mother, and sister; Heart attack in her maternal grandfather; Heart failure in her brother; Hypertension in her father, mother, and sister. There is no history of Breast cancer.  ROS:   Please see the history of present illness.     EKGs/Labs/Other Studies Reviewed:    The following studies were reviewed today:  Left Cardiac Catheterization 12/28/2020:   Prox Cx to Mid Cx lesion is 99% stenosed.   Prox RCA lesion is 20% stenosed.   Previously placed Mid Cx stent (unknown type) is  widely patent.   A drug-eluting stent was successfully placed using a STENT ONYX FRONTIER 2.5X18.   Post intervention, there is a 0% residual stenosis.   The left ventricular systolic function is normal.   LV end diastolic  pressure is mildly elevated.   The left ventricular ejection fraction is 55-65% by visual estimate.   1.  Significant one-vessel coronary artery disease with severe proximal edge restenosis of the previously placed left circumflex stent.  No other obstructive disease. 2.  Normal LV systolic function with mildly elevated left ventricular end-diastolic pressure at 15 mmHg. 3.  Successful balloon angioplasty and drug-eluting stent placement to overlap with the previously placed stent.   Recommendations: Continue dual antiplatelet therapy. Continue aggressive treatment of risk factors. The patient is a candidate for discharge in the afternoon if no issues.  Diagnostic Dominance: Right Intervention    Echocardiogram 12/28/2020: Impressions: 1. Left ventricular ejection fraction, by estimation, is 60 to 65%. The  left ventricle has normal function. The left ventricle has no regional  wall motion abnormalities. There  is mild left ventricular hypertrophy.  Left ventricular diastolic parameters  are consistent with Grade I diastolic dysfunction (impaired relaxation).   2. Right ventricular systolic function is normal. The right ventricular  size is normal.   3. The mitral valve is normal in structure. No evidence of mitral valve  regurgitation. No evidence of mitral stenosis.   4. The aortic valve was not well visualized. Aortic valve regurgitation  is not visualized. No aortic stenosis is present.   5. The inferior vena cava is normal in size with greater than 50%  respiratory variability, suggesting right atrial pressure of 3 mmHg.  EKG:  EKG ordered today. EKG personally reviewed and demonstrates ***.  Recent Labs: 12/28/2020: BUN 18; Creatinine, Ser 1.24; Hemoglobin 13.9; Magnesium 2.1; Platelets 267; Potassium 3.9; Sodium 140  Recent Lipid Panel    Component Value Date/Time   CHOL 138 05/11/2020 1558   TRIG 137 05/11/2020 1558   HDL 48 05/11/2020 1558   CHOLHDL 2.9 05/11/2020 1558   CHOLHDL 2.9 06/17/2013 1403   VLDL 16 06/17/2013 1403   LDLCALC 66 05/11/2020 1558    Physical Exam:    Vital Signs: There were no vitals taken for this visit.    Wt Readings from Last 3 Encounters:  12/26/20 222 lb (100.7 kg)  11/13/20 222 lb 6.4 oz (100.9 kg)  06/27/20 240 lb (108.9 kg)     General: 70 y.o. female in no acute distress. HEENT: Normocephalic and atraumatic. Sclera clear. EOMs intact. Neck: Supple. No carotid bruits. No JVD. Heart: *** RRR. Distinct S1 and S2. No murmurs, gallops, or rubs. Radial and distal pedal pulses 2+ and equal bilaterally. Lungs: No increased work of breathing. Clear to ausculation bilaterally. No wheezes, rhonchi, or rales.  Abdomen: Soft, non-distended, and non-tender to palpation. Bowel sounds present in all 4 quadrants.  MSK: Normal strength and tone for age. *** Extremities: No lower extremity edema.    Skin: Warm and dry. Neuro: Alert and oriented x3. No  focal deficits. Psych: Normal affect. Responds appropriately.   Assessment:    No diagnosis found.  Plan:     Disposition: Follow up in ***   Medication Adjustments/Labs and Tests Ordered: Current medicines are reviewed at length with the patient today.  Concerns regarding medicines are outlined above.  No orders of the defined types were placed in this encounter.  No orders of the defined types were placed in this encounter.   There are no Patient Instructions on file for this visit.   Signed, Darreld Mclean, PA-C  01/04/2021 4:35 PM    [facility]

## 2021-01-07 NOTE — Progress Notes (Signed)
Office Visit    Patient Name: Yvonne Shaw Date of Encounter: 01/08/2021  Primary Care Provider:  System, Provider Not In Primary Cardiologist:  Quay Burow, MD  Chief Complaint   71 year old female with a history of CAD (STEMI in 2015 s/p DES-LCx, NSTEMI 12/2020 s/p DES-LCx overlapping previously placed stent), hypertension, hyperlipidemia, type 2 diabetes, and obesity who presents for follow-up post hospitalization related to NSTEMI s/p DES.   Past Medical History    Past Medical History:  Diagnosis Date   Abnormal CT scan, colon 05/21/2015   Anginal pain (HCC)    CAD S/P percutaneous coronary angioplasty 03/19/2013   s/p 2.25 mm x 15 mm (2.46) DES to mid AV Groove Circumflex   CKD (chronic kidney disease)    CVA (cerebral infarction)    Residual R leg weakness   Diabetes mellitus type 2 in obese (HCC)    Fibroid    GERD (gastroesophageal reflux disease)    Hyperlipidemia LDL goal <70    Hypertension    Nonsustained ventricular tachycardia    found on exam monitoring, asymptomatic   Obesity    Orthopnea    PVC's (premature ventricular contractions)    seen on telemetry during cardiac rehabilitation   Shortness of breath dyspnea    due to brilinta   ST elevation myocardial infarction (STEMI) of inferolateral wall, subsequent episode of care ([place]) 03/19/2013   s/p DES to mid AV Groove Circumflex; EF 50% on cath   Stroke [facility])    2001   Past Surgical History:  Procedure Laterality Date   ABDOMINAL HYSTERECTOMY     CATARACT EXTRACTION W/PHACO Right 09/05/2014   Procedure: CATARACT EXTRACTION PHACO AND INTRAOCULAR LENS PLACEMENT ([place]);  Surgeon: Birder Robson, MD;  Location: [facility] ORS;  Service: Ophthalmology;  Laterality: Right;  [place]: 01:12.2 AP%: 20.1 CDE: 14.45 Lot # [id number]   CHOLECYSTECTOMY     COLONOSCOPY WITH PROPOFOL N/A 07/02/2015   Procedure: COLONOSCOPY WITH PROPOFOL;  Surgeon: Manya Silvas, MD;  Location: [facility] ENDOSCOPY;  Service: Endoscopy;   Laterality: N/A;   COLONOSCOPY WITH PROPOFOL N/A 06/27/2020   Procedure: COLONOSCOPY WITH PROPOFOL;  Surgeon: Robert Bellow, MD;  Location: [facility] ENDOSCOPY;  Service: Endoscopy;  Laterality: N/A;  DM, TAKES PLAVIX   CORONARY STENT INTERVENTION N/A 12/28/2020   Procedure: CORONARY STENT INTERVENTION;  Surgeon: Wellington Hampshire, MD;  Location: [place] CV LAB;  Service: Cardiovascular;  Laterality: N/A;   LEFT HEART CATH AND CORONARY ANGIOGRAPHY N/A 12/28/2020   Procedure: LEFT HEART CATH AND CORONARY ANGIOGRAPHY possible percutaneous intervention;  Surgeon: Wellington Hampshire, MD;  Location: [place] CV LAB;  Service: Cardiovascular;  Laterality: N/A;   LEFT HEART CATHETERIZATION WITH CORONARY ANGIOGRAM N/A 03/19/2013   Procedure: LEFT HEART CATHETERIZATION WITH CORONARY ANGIOGRAM;  Surgeon: Lorretta Harp, MD;  Location: [facility] CATH LAB;  Service: Cardiovascular;  Laterality: N/A;   PERCUTANEOUS CORONARY STENT INTERVENTION (PCI-S)  03/19/2013   PCI to mCx - 90% lesion: 2.25 mm x 15 mm Xience Alpine DES (2.46 mm post-dilation); EF ~50%    Allergies  Allergies  Allergen Reactions   Aggrenox [Aspirin-Dipyridamole Er] Other (See Comments), Nausea Only and Nausea And Vomiting    Severe headache   Carvedilol Other (See Comments)    caused fatigue    History of Present Illness    71 year old female with the above past medical history including CAD (STEMI in 2015 s/p DES-LCx, NSTEMI 12/2020 s/p DES-LCx overlapping previously placed stent), hypertension, hyperlipidemia, type 2  diabetes, and obesity.   She was last seen in clinic on 11/13/2020 for routine follow-up and was doing well from a cardiac standpoint. Unfortunately, she later presented to the ED on 12/27/2020 with acute chest pain, similar to prior anginal equivalent. Troponin was elevated (peak 1669 ng/L). She was hospitalized from 12/26/20-12/28/20 in the setting of NSTEMI. Cardiac catheterization showed significant one-vessel  coronary artery disease with severe proximal edge restenosis of the previously placed left circumflex stent, no other obstructive disease (pRCA 20%), normal EF, mildly elevated LVEDP at 89mmHg. She underwent successful balloon angioplasty and DES-LCx which overlaps the previously placed stent. Echo showed EF 60-65%, mild LVH, GIDD. She was discharged home in stable condition on ASA and Plavix.    Since her discharge, she reports feeling well from a cardiac standpoint.  She denies chest pain, palpitations, dizziness. She did report 1 episode of dark stools approximately 1 week ago. He has a history of bloody stools in the past and was seen by GI who did a colonoscopy and told her that there was no concern or need for further intervention.  There has been no recurrence.  Overall, she feels back to her baseline and has no other concerns today.  Home Medications    Current Outpatient Medications  Medication Sig Dispense Refill   amLODipine (NORVASC) 10 MG tablet Take 1 tablet by mouth daily.     aspirin 81 MG tablet Take 81 mg by mouth daily.     atorvastatin (LIPITOR) 80 MG tablet Take 1 tablet (80 mg total) by mouth daily at 6 PM. 30 tablet 5   Blood Glucose Monitoring Suppl (ONE TOUCH ULTRA SYSTEM KIT) W/DEVICE KIT 1 kit by Does not apply route once. 1 each 0   Cholecalciferol (VITAMIN D) 50 MCG (2000 UT) CAPS Take 6,000 Units by mouth daily.     clopidogrel (PLAVIX) 75 MG tablet Take 1 tablet (75 mg total) by mouth daily. 90 tablet 3   ezetimibe (ZETIA) 10 MG tablet Take 10 mg by mouth daily.     lisinopril (PRINIVIL,ZESTRIL) 20 MG tablet Take 20 mg by mouth daily.     metoprolol tartrate (LOPRESSOR) 25 MG tablet Hold due to low heart rate.     nitroGLYCERIN (NITROSTAT) 0.4 MG SL tablet Place 1 tablet (0.4 mg total) under the tongue every 5 (five) minutes x 3 doses as needed for chest pain. 25 tablet 2   No current facility-administered medications for this visit.     Review of Systems    She  denies chest pain, palpitations, dyspnea, pnd, orthopnea, n, v, dizziness, syncope, edema, weight gain, or early satiety. All other systems reviewed and are otherwise negative except as noted above.    Cardiac Rehabilitation Eligibility Assessment  The patient is ready to start cardiac rehabilitation from a cardiac standpoint.   Physical Exam    VS:  BP 116/62 (BP Location: Left Arm, Patient Position: Sitting, Cuff Size: Normal)    Pulse (!) 52    Resp 20    Ht $R'5\' 6"'Bc$  (1.676 m)    Wt 218 lb 3.2 oz (99 kg)    BMI 35.22 kg/m  GEN: Well nourished, well developed, in no acute distress. HEENT: normal. Neck: Supple, no JVD, carotid bruits, or masses. Cardiac: Bradycardia, no murmurs, rubs, or gallops. No clubbing, cyanosis, edema.  Radials/DP/PT 2+ and equal bilaterally. R radial cath site without bruising, bleeding or hematoma.  Respiratory:  Respirations regular and unlabored, clear to auscultation bilaterally. GI: Soft, nontender, nondistended, BS +  x 4. MS: no deformity or atrophy. Skin: warm and dry, no rash. Neuro:  Strength and sensation are intact. Psych: Normal affect.  Accessory Clinical Findings    ECG personally reviewed by me today - Sinus bradycardia with sinus arrhythmia,  52 bpm- no acute changes.  Lab Results  Component Value Date   WBC 8.0 12/28/2020   HGB 13.9 12/28/2020   HCT 41.8 12/28/2020   MCV 82.1 12/28/2020   PLT 267 12/28/2020   Lab Results  Component Value Date   CREATININE 1.24 (H) 12/28/2020   BUN 18 12/28/2020   NA 140 12/28/2020   K 3.9 12/28/2020   CL 110 12/28/2020   CO2 24 12/28/2020   Lab Results  Component Value Date   ALT 23 04/21/2016   AST 21 04/21/2016   ALKPHOS 73 04/21/2016   BILITOT 0.5 04/21/2016   Lab Results  Component Value Date   CHOL 138 05/11/2020   HDL 48 05/11/2020   LDLCALC 66 05/11/2020   TRIG 137 05/11/2020   CHOLHDL 2.9 05/11/2020    Lab Results  Component Value Date   HGBA1C 6.5 (H) 03/19/2013     Assessment & Plan   1. NSTEMI s/p DES/chest pain: Cath on 12/28/2020 showed severe proximal edge restenosis of the previously placed left circumflex stent, no other obstructive disease (pRCA 20%), normal EF, mildly elevated LVEDP at 42mmHg. S/p balloon angioplasty and DES-LCx which overlaps the previously placed stent. Echo showed EF 60-65%, mild LVH, GIDD. Stable with no anginal symptoms. She is bradycardic at baseline, HR 52 bpm, asymptomatic.  Her metoprolol was decreased to 25 mg daily in the hospital and she seems to be tolerating this well. We discussed cardiac rehab, I encouraged her to participate, however she is not yet sure if she will attend.  1 episode of dark stool on Plavix.  I encouraged her to continue to monitor and report any further bloody stools.  Continue ASA, Plavix, Lipitor, Zetia, lisinopril, and metoprolol tartrate.   2. Chronic diastolic heart failure: Recent echo as above. Euvolemic and well compensated on exam. Continue lisinopril, metoprolol.   3. Hypertension: BP well controlled, 116/62. Continue current antihypertensive regimen.   4. Hyperlipidemia: Follows with Dr. Debara Pickett in the lipid clinic.  LDL in May 2022 was 66, however there is a LDL of 138 from 09/06/2020 from [place], I am not sure if this was fasting or not. Will arrange for repeat lipid panel prior to follow-up appointment with Dr. Debara Pickett in March 2023. Continue atorvastatin 80 mg daily and Zetia 10 mg daily. Encouraged lifestyle modifications with diet and exercise.  5.  Disposition: Follow-up in 4 to 6 months.  Lenna Sciara, NP 01/08/2021, 1:20 PM

## 2021-01-08 ENCOUNTER — Ambulatory Visit: Payer: Medicare HMO | Admitting: Nurse Practitioner

## 2021-01-08 ENCOUNTER — Other Ambulatory Visit: Payer: Self-pay

## 2021-01-08 ENCOUNTER — Encounter: Payer: Self-pay | Admitting: Student

## 2021-01-08 VITALS — BP 116/62 | HR 52 | Resp 20 | Ht 66.0 in | Wt 218.2 lb

## 2021-01-08 DIAGNOSIS — I10 Essential (primary) hypertension: Secondary | ICD-10-CM | POA: Diagnosis not present

## 2021-01-08 DIAGNOSIS — I5032 Chronic diastolic (congestive) heart failure: Secondary | ICD-10-CM | POA: Diagnosis not present

## 2021-01-08 DIAGNOSIS — I2510 Atherosclerotic heart disease of native coronary artery without angina pectoris: Secondary | ICD-10-CM

## 2021-01-08 DIAGNOSIS — I214 Non-ST elevation (NSTEMI) myocardial infarction: Secondary | ICD-10-CM | POA: Diagnosis not present

## 2021-01-08 DIAGNOSIS — E785 Hyperlipidemia, unspecified: Secondary | ICD-10-CM

## 2021-01-08 MED ORDER — NITROGLYCERIN 0.4 MG SL SUBL: 0.4000 mg | SUBLINGUAL_TABLET | SUBLINGUAL | 2 refills | Status: DC | PRN

## 2021-01-08 NOTE — Patient Instructions (Addendum)
Medication Instructions:  Your physician recommends that you continue on your current medications as directed. Please refer to the Current Medication list given to you today.  *If you need a refill on your cardiac medications before your next appointment, please call your pharmacy*  Lab Work: Your physician recommends that you return for lab work 1-2 days prior to appointment with Dr. Debara Pickett:  Fasting Lipid Panel-DO NOT EAT OR DRINK PAST MIDNIGHT. OKAY HAVE WATER. Hepatic (Liver) Function Test   If you have labs (blood work) drawn today and your tests are completely normal, you will receive your results only by: MyChart Message (if you have MyChart) OR A paper copy in the mail If you have any lab test that is abnormal or we need to change your treatment, we will call you to review the results.  Testing/Procedures: NONE ordered at this time of appointment   Follow-Up: At [facility], you and your health needs are our priority.  As part of our continuing mission to provide you with exceptional heart care, we have created designated Provider Care Teams.  These Care Teams include your primary Cardiologist (physician) and Advanced Practice Providers (APPs -  Physician Assistants and Nurse Practitioners) who all work together to provide you with the care you need, when you need it.  Your next appointment:   4-6 month(s)  The format for your next appointment:   In Person  Provider:   Quay Burow, MD  or  APP        Other Instructions

## 2021-01-08 NOTE — Addendum Note (Signed)
Addended by: Jacqulynn Cadet on: 01/08/2021 05:14 PM   Modules accepted: Orders

## 2021-03-02 LAB — LIPID PANEL
Chol/HDL Ratio: 2.8 ratio (ref 0.0–4.4)
Chol/HDL Ratio: 2.7 ratio (ref 0.0–4.4)
Cholesterol, Total: 133 mg/dL (ref 100–199)
Cholesterol, Total: 138 mg/dL (ref 100–199)
HDL: 48 mg/dL (ref >39)
HDL: 52 mg/dL (ref >39)
LDL Chol Calc (NIH): 65 mg/dL (ref 0–99)
LDL Chol Calc (NIH): 65 mg/dL (ref 0–99)
Triglycerides: 113 mg/dL (ref 0–149)
Triglycerides: 114 mg/dL (ref 0–149)
VLDL Cholesterol Cal: 20 mg/dL (ref 5–40)
VLDL Cholesterol Cal: 21 mg/dL (ref 5–40)

## 2021-03-02 LAB — HEPATIC FUNCTION PANEL
ALT: 21 IU/L (ref 0–32)
AST: 21 IU/L (ref 0–40)
Albumin: 4.4 g/dL (ref 3.8–4.8)
Alkaline Phosphatase: 92 IU/L (ref 44–121)
Bilirubin Total: 0.4 mg/dL (ref 0.0–1.2)
Bilirubin, Direct: 0.12 mg/dL (ref 0.00–0.40)
Total Protein: 6.7 g/dL (ref 6.0–8.5)

## 2021-03-06 ENCOUNTER — Encounter: Payer: Self-pay | Admitting: Internal Medicine

## 2021-03-06 ENCOUNTER — Other Ambulatory Visit: Payer: Self-pay

## 2021-03-06 ENCOUNTER — Telehealth: Payer: Self-pay | Admitting: Internal Medicine

## 2021-03-06 ENCOUNTER — Telehealth (INDEPENDENT_AMBULATORY_CARE_PROVIDER_SITE_OTHER): Payer: Medicare HMO | Admitting: Internal Medicine

## 2021-03-06 VITALS — BP 136/79 | Wt 216.0 lb

## 2021-03-06 DIAGNOSIS — I214 Non-ST elevation (NSTEMI) myocardial infarction: Secondary | ICD-10-CM | POA: Diagnosis not present

## 2021-03-06 DIAGNOSIS — I2510 Atherosclerotic heart disease of native coronary artery without angina pectoris: Secondary | ICD-10-CM

## 2021-03-06 DIAGNOSIS — E785 Hyperlipidemia, unspecified: Secondary | ICD-10-CM

## 2021-03-06 MED ORDER — REPATHA SURECLICK 140 MG/ML ~~LOC~~ SOAJ: 1.0000 | SUBCUTANEOUS | 11 refills | Status: DC

## 2021-03-06 NOTE — Patient Instructions (Signed)
Medication Instructions:  ?Dr. Debara Pickett recommends Repatha 140mg /mL (PCSK9). This is an injectable cholesterol medication self-administered once every 14 days. This medication will likely need prior approval with your insurance company, which we will work on. If the medication is not approved initially, we may need to do an appeal with your insurance.  ? ?Administer medication in area of fatty tissue such as abdomen, outer thigh, back of upper arm - and rotate site with each injection ?Store medication in refrigerator until ready to administer - allow to sit at room temp for 30 mins - 1 hour prior to injection ?Dispose of medication in a SHARPS container - your pharmacy should be able to direct you on this and proper disposal  ? ?Patient Assistance:   ?The [employer] Foundation: https://www.panfoundation.org/disease-funds/hypercholesterolemia/ ?-- can sign up for wait list ? ?The [employer] foundation offers assistance to help pay for medication copays.  They will cover copays for all cholesterol lowering meds, including statins, fibrates, omega-3 fish oils like Vascepa, ezetimibe, Repatha, Praluent, Nexletol, Nexlizet.  The cards are usually good for $2,500 or 12 months, whichever comes first. ?Go to healthwellfoundation.org ?Click on ?Apply Now? ?Answer questions as to whom is applying (patient or representative) ?Your disease fund will be ?hypercholesterolemia - Medicare access? ?They will ask questions about finances and which medications you are taking for cholesterol ?When you submit, the approval is usually within minutes.  You will need to print the card information from the site ?You will need to show this information to your pharmacy, they will bill your Medicare Part D plan first -then bill [employer] --for the copay.   ?You can also call them at [phone], although the hold times can be quite long.  ? ? ? ?*If you need a refill on your cardiac medications before your next appointment, please call your  pharmacy* ? ? ?Lab Work: ?FASTING lab work to check cholesterol in about 3-4 months ?-- complete about 1 week before next appointment with Dr. Debara Pickett  ? ?If you have labs (blood work) drawn today and your tests are completely normal, you will receive your results only by: ?MyChart Message (if you have MyChart) OR ?A paper copy in the mail ?If you have any lab test that is abnormal or we need to change your treatment, we will call you to review the results. ? ? ?Testing/Procedures: ?NONE ? ? ?Follow-Up: ?At [facility], you and your health needs are our priority.  As part of our continuing mission to provide you with exceptional heart care, we have created designated Provider Care Teams.  These Care Teams include your primary Cardiologist (physician) and Advanced Practice Providers (APPs -  Physician Assistants and Nurse Practitioners) who all work together to provide you with the care you need, when you need it. ? ?We recommend signing up for the patient portal called "MyChart".  Sign up information is provided on this After Visit Summary.  MyChart is used to connect with patients for Virtual Visits (Telemedicine).  Patients are able to view lab/test results, encounter notes, upcoming appointments, etc.  Non-urgent messages can be sent to your provider as well.   ?To learn more about what you can do with MyChart, go to [email or web address].   ? ?Your next appointment:   ? ?3-4 months -- lipid clinic -- Dr. Debara Pickett  ? ?

## 2021-03-06 NOTE — Telephone Encounter (Signed)
Medication approved: ?PA Case: [id number], Status: Approved, Coverage Starts on: 03/06/2021 12:00:00 AM, Coverage Ends on: 09/02/2021 12:00:00 AM ? ? ?Patient notified via [place] ?

## 2021-03-06 NOTE — Telephone Encounter (Signed)
PA for repatha submitted via CMM ?(Key: [id number]) ?

## 2021-03-06 NOTE — Progress Notes (Signed)
Virtual Visit via Telephone Note   This visit type was conducted due to national recommendations for restrictions regarding the COVID-19 Pandemic (e.g. social distancing) in an effort to limit this patient's exposure and mitigate transmission in our community.  Due to her co-morbid illnesses, this patient is at least at moderate risk for complications without adequate follow up.  This format is felt to be most appropriate for this patient at this time.  All issues noted in this document were discussed and addressed.  A limited physical exam was performed with this format.  Please refer to the patient's chart for her consent to telehealth for [facility].      Date:  03/06/2021   ID:  Yvonne Shaw, DOB [birth year], MRN [id number] The patient was identified using 2 identifiers.  Evaluation Performed:  Follow-Up Visit  Patient Location:  [place] [id number]  Provider location:   [address] [place], [place] [address]  PCP:  Elza Rafter, MD  Cardiologist:  Quay Burow, MD Electrophysiologist:  None   Chief Complaint:  Manage dyslipidemia  History of Present Illness:    Yvonne Shaw is a 71 y.o. female who presents via telephone conferencing for a telehealth visit today. This is a pleasant 71 year old female kindly referred by Dr. Gwenlyn Found for evaluation and management of dyslipidemia. She has a history of coronary artery disease with ST elevation MI in 2015 and is status post PCI to the circumflex artery at that time. Since then she is done well. Other risk factors include diabetes, hypertension and obesity. She does have a mixed dyslipidemia. Her last cholesterol numbers were in 7/21 through [facility], indicating total cholesterol 183, LDL 113 and HDL 50. Since then she says she is worked on her diet and is actively exercising more and working on weight loss. Her target LDL is less than 70. Current therapy includes atorvastatin 80 mg daily which she  is tolerating well and has been on since her ST elevation MI.  05/15/2020  Yvonne Shaw was seen today for telephone follow-up.  We could not connect via video chat for unknown reasons.  She did start taking ezetimibe after her last labs.  Repeat labs were performed about 4 days ago.  He has had an expected improvement in her lipids with total cholesterol down to 138, triglycerides 137, HDL 48 and LDL 66.  She seems to be tolerating ezetimibe well.  03/06/2021  Yvonne Shaw returns today for video follow-up.  Currently she is doing well.  She was just seen in follow-up from PCI in January.  In December she had a recurrent non-STEMI with troponin almost 2000 and underwent left heart catheterization.  She was found to have 99% stenosis of the proximal to mid circumflex at the edge of a previously placed mid circumflex stent.  This was successfully treated with PCI and an overlapping stent.  Since then she has had no further chest pain.  Lipids have been fairly stable on combination of ezetimibe and atorvastatin.  Recent total cholesterol 138, triglycerides 114 HDL 52 and LDL 65.  This is down from an LDL of 89 about a year ago before starting ezetimibe.  Although she has been losing weight she has not been able to get the LDL lower than this.  Based on new guidelines in October and the fact that she has had prior MI and a recurrent MI, she is considered very high risk and therefore her target LDL recommendation is now less than  55.  I think it is unlikely she will reach that just with diet alone and we cannot further uptitrate her current medications.  She is a good candidate therefore for PCSK9 inhibitor.  The patient does not have symptoms concerning for COVID-19 infection (fever, chills, cough, or new SHORTNESS OF BREATH).    Prior CV studies:   The following studies were reviewed today:  Chart reviewed, lab work  PMHx:  Past Medical History:  Diagnosis Date   Abnormal CT scan, colon 05/21/2015    Anginal pain (HCC)    CAD S/P percutaneous coronary angioplasty 03/19/2013   s/p 2.25 mm x 15 mm (2.46) DES to mid AV Groove Circumflex   CKD (chronic kidney disease)    CVA (cerebral infarction)    Residual R leg weakness   Diabetes mellitus type 2 in obese (HCC)    Fibroid    GERD (gastroesophageal reflux disease)    Hyperlipidemia LDL goal <70    Hypertension    Nonsustained ventricular tachycardia    found on exam monitoring, asymptomatic   Obesity    Orthopnea    PVC's (premature ventricular contractions)    seen on telemetry during cardiac rehabilitation   Shortness of breath dyspnea    due to brilinta   ST elevation myocardial infarction (STEMI) of inferolateral wall, subsequent episode of care ([place]) 03/19/2013   s/p DES to mid AV Groove Circumflex; EF 50% on cath   Stroke [facility])    2001    Past Surgical History:  Procedure Laterality Date   ABDOMINAL HYSTERECTOMY     CATARACT EXTRACTION W/PHACO Right 09/05/2014   Procedure: CATARACT EXTRACTION PHACO AND INTRAOCULAR LENS PLACEMENT ([place]);  Surgeon: Birder Robson, MD;  Location: [facility] ORS;  Service: Ophthalmology;  Laterality: Right;  [place]: 01:12.2 AP%: 20.1 CDE: 14.45 Lot # [id number]   CHOLECYSTECTOMY     COLONOSCOPY WITH PROPOFOL N/A 07/02/2015   Procedure: COLONOSCOPY WITH PROPOFOL;  Surgeon: Manya Silvas, MD;  Location: [facility] ENDOSCOPY;  Service: Endoscopy;  Laterality: N/A;   COLONOSCOPY WITH PROPOFOL N/A 06/27/2020   Procedure: COLONOSCOPY WITH PROPOFOL;  Surgeon: Robert Bellow, MD;  Location: [facility] ENDOSCOPY;  Service: Endoscopy;  Laterality: N/A;  DM, TAKES PLAVIX   CORONARY STENT INTERVENTION N/A 12/28/2020   Procedure: CORONARY STENT INTERVENTION;  Surgeon: Wellington Hampshire, MD;  Location: [place] CV LAB;  Service: Cardiovascular;  Laterality: N/A;   LEFT HEART CATH AND CORONARY ANGIOGRAPHY N/A 12/28/2020   Procedure: LEFT HEART CATH AND CORONARY ANGIOGRAPHY possible percutaneous intervention;   Surgeon: Wellington Hampshire, MD;  Location: [place] CV LAB;  Service: Cardiovascular;  Laterality: N/A;   LEFT HEART CATHETERIZATION WITH CORONARY ANGIOGRAM N/A 03/19/2013   Procedure: LEFT HEART CATHETERIZATION WITH CORONARY ANGIOGRAM;  Surgeon: Lorretta Harp, MD;  Location: [facility] CATH LAB;  Service: Cardiovascular;  Laterality: N/A;   PERCUTANEOUS CORONARY STENT INTERVENTION (PCI-S)  03/19/2013   PCI to mCx - 90% lesion: 2.25 mm x 15 mm Xience [place] DES (2.46 mm post-dilation); EF ~50%    FAMHx:  Family History  Problem Relation Age of Onset   Cancer Mother    Diabetes Mother    Hypertension Mother    Hypertension Father    Cancer Father    Diabetes Father    Hypertension Sister    Diabetes Sister    Heart failure Brother    Diabetes Brother    Heart attack Maternal Grandfather    Breast cancer Neg Hx     SOCHx:  reports that she has never smoked. She has never used smokeless tobacco. She reports that she does not drink alcohol and does not use drugs.  ALLERGIES:  Allergies  Allergen Reactions   Aggrenox [Aspirin-Dipyridamole Er] Other (See Comments), Nausea Only and Nausea And Vomiting    Severe headache   Carvedilol Other (See Comments)    caused fatigue    MEDS:  Current Meds  Medication Sig   amLODipine (NORVASC) 10 MG tablet Take 1 tablet by mouth daily.   aspirin 81 MG tablet Take 81 mg by mouth daily.   atorvastatin (LIPITOR) 80 MG tablet Take 1 tablet (80 mg total) by mouth daily at 6 PM.   Blood Glucose Monitoring Suppl (ONE TOUCH ULTRA SYSTEM KIT) W/DEVICE KIT 1 kit by Does not apply route once.   Cholecalciferol (VITAMIN D) 50 MCG (2000 UT) CAPS Take 6,000 Units by mouth daily.   clopidogrel (PLAVIX) 75 MG tablet Take 1 tablet (75 mg total) by mouth daily.   Evolocumab (REPATHA SURECLICK) 800 MG/ML SOAJ Inject 1 Dose into the skin every 14 (fourteen) days.   ezetimibe (ZETIA) 10 MG tablet Take 10 mg by mouth daily.   lisinopril (PRINIVIL,ZESTRIL) 20  MG tablet Take 20 mg by mouth daily.   metoprolol succinate (TOPROL-XL) 25 MG 24 hr tablet Take 25 mg by mouth daily.   nitroGLYCERIN (NITROSTAT) 0.4 MG SL tablet Place 1 tablet (0.4 mg total) under the tongue every 5 (five) minutes x 3 doses as needed for chest pain.   [DISCONTINUED] metoprolol tartrate (LOPRESSOR) 25 MG tablet Hold due to low heart rate.     ROS: Pertinent items noted in HPI and remainder of comprehensive ROS otherwise negative.  Labs/Other Tests and Data Reviewed:    Recent Labs: 12/28/2020: BUN 18; Creatinine, Ser 1.24; Hemoglobin 13.9; Magnesium 2.1; Platelets 267; Potassium 3.9; Sodium 140 03/01/2021: ALT 21   Recent Lipid Panel Lab Results  Component Value Date/Time   CHOL 133 03/01/2021 01:03 PM   CHOL 138 03/01/2021 01:03 PM   TRIG 113 03/01/2021 01:03 PM   TRIG 114 03/01/2021 01:03 PM   HDL 48 03/01/2021 01:03 PM   HDL 52 03/01/2021 01:03 PM   CHOLHDL 2.8 03/01/2021 01:03 PM   CHOLHDL 2.7 03/01/2021 01:03 PM   CHOLHDL 2.9 06/17/2013 02:03 PM   LDLCALC 65 03/01/2021 01:03 PM   LDLCALC 65 03/01/2021 01:03 PM    Wt Readings from Last 3 Encounters:  03/06/21 216 lb (98 kg)  01/08/21 218 lb 3.2 oz (99 kg)  12/26/20 222 lb (100.7 kg)     Exam:    Vital Signs:  BP 136/79    Wt 216 lb (98 kg)    BMI 34.86 kg/m    General appearance: alert and no distress Lungs: No visual respiratory difficulty Abdomen: Obese Extremities: extremities normal, atraumatic, no cyanosis or edema Skin: Skin color, texture, turgor normal. No rashes or lesions Neurologic: Grossly normal  ASSESSMENT & PLAN:    Recent MI with PCI to the circumflex at the site of the edge of a previously placed stent (12/2020) Mixed dyslipidemia, now considered very high risk, goal LDL less than 55 Coronary artery disease status post STEMI in 2015, status post PCI to the circumflex Type 2 diabetes Hypertension Morbid obesity  Yvonne Shaw had recurrent MI with PCI to the circumflex at  the leading edge of a prior stent to the circumflex.  Although her lipids have improved, she would be considered very high risk at this point with  a target LDL less than 55.  She has not been able to achieve that on combination atorvastatin and ezetimibe.  She is a good candidate for PCSK9 inhibitor.  Although she is hesitant to take additional medicines, she seemed agreeable to it.  Would advise starting Repatha 140 mg every 2 weeks.  Plan to repeat lipid NMR in about 3 months.  If her cholesterol is extremely low we may be able to eliminate her ezetimibe but would continue high intensity atorvastatin.  Follow-up with me afterwards.  COVID-19 Education: The signs and symptoms of COVID-19 were discussed with the patient and how to seek care for testing (follow up with PCP or arrange E-visit).  The importance of social distancing was discussed today.  Patient Risk:   After full review of this patients clinical status, I feel that they are at least moderate risk at this time.  Time:   Today, I have spent 25 minutes with the patient with telehealth technology discussing dyslipidemia, target LDL cholesterol, medication management..     Medication Adjustments/Labs and Tests Ordered: Current medicines are reviewed at length with the patient today.  Concerns regarding medicines are outlined above.   Tests Ordered: Orders Placed This Encounter  Procedures   NMR, lipoprofile    Medication Changes: Meds ordered this encounter  Medications   Evolocumab (REPATHA SURECLICK) 707 MG/ML SOAJ    Sig: Inject 1 Dose into the skin every 14 (fourteen) days.    Dispense:  2 mL    Refill:  11    Disposition:  in 6 month(s)  Pixie Casino, MD, [facility], [place] Director of the Advanced Lipid Disorders &  Cardiovascular Risk Reduction Clinic Diplomate of the American Board of Clinical Lipidology Attending Cardiologist  Direct Dial: [phone]   Fax: [phone]   Website:  [email or web address]  Pixie Casino, MD  03/06/2021 8:31 AM

## 2021-03-14 ENCOUNTER — Other Ambulatory Visit: Payer: Self-pay | Admitting: Unknown Physician Specialty

## 2021-03-14 DIAGNOSIS — J329 Chronic sinusitis, unspecified: Secondary | ICD-10-CM

## 2021-03-15 ENCOUNTER — Other Ambulatory Visit: Payer: Self-pay | Admitting: Internal Medicine

## 2021-03-27 ENCOUNTER — Other Ambulatory Visit: Payer: Self-pay | Admitting: Unknown Physician Specialty

## 2021-03-27 DIAGNOSIS — J329 Chronic sinusitis, unspecified: Secondary | ICD-10-CM

## 2021-04-01 ENCOUNTER — Ambulatory Visit
Admission: RE | Admit: 2021-04-01 | Discharge: 2021-04-01 | Disposition: A | Discharge status: Home / Self Care | Payer: Medicare HMO | Source: Ambulatory Visit | Attending: Unknown Physician Specialty | Admitting: Unknown Physician Specialty

## 2021-04-01 DIAGNOSIS — J329 Chronic sinusitis, unspecified: Secondary | ICD-10-CM

## 2021-05-08 ENCOUNTER — Encounter: Payer: Self-pay | Admitting: Cardiovascular Disease

## 2021-05-08 ENCOUNTER — Ambulatory Visit: Payer: Medicare HMO | Admitting: Cardiovascular Disease

## 2021-05-08 DIAGNOSIS — E785 Hyperlipidemia, unspecified: Secondary | ICD-10-CM | POA: Diagnosis not present

## 2021-05-08 DIAGNOSIS — I10 Essential (primary) hypertension: Secondary | ICD-10-CM

## 2021-05-08 DIAGNOSIS — I2121 ST elevation (STEMI) myocardial infarction involving left circumflex coronary artery: Secondary | ICD-10-CM

## 2021-05-08 NOTE — Assessment & Plan Note (Signed)
History of CAD status post inferior STEMI 03/19/2013 with stenting of her AV groove circumflex by myself.  She presented on 12/27/2020 with chest pain and had a non-STEMI.  She was catheterized the following day by Dr. Fletcher Anon revealed a patent circumflex stent with a 99% stenosis just proximal to this which was restented.  Otherwise she had nonobstructive CAD and normal LV function.  She remains on aspirin and clopidogrel and is asymptomatic. ?

## 2021-05-08 NOTE — Progress Notes (Signed)
? ? ? ?05/08/2021 ?Yvonne Shaw   ?11/23/1950  ?[id number] ? ?Primary Physician Jose-Mathews, Nada Boozer, MD ?Primary Cardiologist: Lorretta Harp MD Lupe Carney, [place] ? ?HPI:  Yvonne Shaw is a 71 y.o.  Married [place] American female mother of 2, grandmother and 6 grandchildren he worked as a Marine scientist at [employer], and retired in 2020.. I last saw her in the office 11/13/2020.Marland KitchenShe had no prior cardiac history, PMHx s/f h/o CVA, HTN, HLD and obesity who was admitted to [facility] on 03/19/13 with an inferior STEMI. She reports being in her USOH until ~ 9PM last night when she began experiencing intermittent substernal chest burning w/o radiation or associated symptoms. She was not initially concerned and attributed this to indigestion. She went to sleep and was awoken at 6AM by the same discomfort which had increased to a 9/10 in intensity and with associated nausea. She got up and tried to busy herself around the house, however the discomfort persisted. She alerted her husband who called 911. Serial EKG tracings in the field reveal fluctuating ST segments (65m elevation to isoelectric) in leads III, aVF. [facility]. She did receive ASA x 4 and heparin bolus. [facility]. 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 She was just been discharged 2 days later. Her only problems have been episodic shortness of breath probably related to the [place] did have an episode of chest pain in May [facility]. While performing cardiac rehabilitation care it was noted that she had frequent PVCs on telemetry although she was unaware of this.patient wore a 30 day event monitor which did show some short runs of nonsustained ventricular tachycardia. Since I saw her one year ago she has  remained stable. Her Brilenta was changed to Plavix because of shortness of breath . ?  ?Since I saw her in the office 6 months ago she did well until 12/27/2020 when she presented with chest pain and non-STEMI.  She underwent cardiac catheterization following day by Dr. AFletcher Anon revealing a patent mid AV groove circumflex stent with a high-grade lesion just proximal to this which was restented.  She otherwise had nonobstructive CAD and normal LV function.  She has been well since denying chest pain or shortness of breath on aspirin and clopidogrel.  She does see Dr. HDebara Pickettat my request for hyperlipidemia and is on high-dose atorvastatin, Zetia and Repatha with an excellent lipid profile. ? ? ?Current Meds  ?Medication Sig  ? amLODipine (NORVASC) 10 MG tablet Take 1 tablet by mouth daily.  ? aspirin 81 MG tablet Take 81 mg by mouth daily.  ? atorvastatin (LIPITOR) 80 MG tablet Take 1 tablet (80 mg total) by mouth daily at 6 PM.  ? Cholecalciferol (VITAMIN D) 50 MCG (2000 UT) CAPS Take 6,000 Units by mouth daily.  ? clopidogrel (PLAVIX) 75 MG tablet Take 1 tablet (75 mg total) by mouth daily.  ? Evolocumab (REPATHA SURECLICK) 1086MG/ML SOAJ Inject 1 Dose into the skin every 14 (fourteen) days.  ? ezetimibe (ZETIA) 10 MG tablet TAKE 1 TABLET EVERY DAY  ? lisinopril (PRINIVIL,ZESTRIL) 20 MG tablet Take 20 mg by mouth daily.  ? metoprolol succinate (TOPROL-XL) 25 MG 24 hr tablet Take 25 mg by mouth daily.  ? nitroGLYCERIN (NITROSTAT) 0.4 MG SL tablet  Place 1 tablet (0.4 mg total) under the tongue every 5 (five) minutes x 3 doses as needed for chest pain.  ?  ? ?Allergies  ?Allergen Reactions  ? Aggrenox [Aspirin-Dipyridamole Er] Other (See Comments), Nausea Only and Nausea And Vomiting  ?  Severe headache  ? Carvedilol Other (See Comments)  ?  caused fatigue  ? ? ?Social History  ? ?Socioeconomic History  ? Marital status: Married  ?  Spouse name: Not on file  ? Number of children: Not on file  ? Years of education: Not  on file  ? Highest education level: Not on file  ?Occupational History  ? Not on file  ?Tobacco Use  ? Smoking status: Never  ? Smokeless tobacco: Never  ?Vaping Use  ? Vaping Use: Never used  ?Substance and Sexual Activity  ? Alcohol use: No  ? Drug use: No  ? Sexual activity: Not on file  ?Other Topics Concern  ? Not on file  ?Social History Narrative  ? Married, 2 healthy children. Lives in [place], [place]. Works at [employer].  ? ?Social Determinants of Health  ? ?Financial Resource Strain: Not on file  ?Food Insecurity: Not on file  ?Transportation Needs: Not on file  ?Physical Activity: Not on file  ?Stress: Not on file  ?Social Connections: Not on file  ?Intimate Partner Violence: Not on file  ?  ? ?Review of Systems: ?General: negative for chills, fever, night sweats or weight changes.  ?Cardiovascular: negative for chest pain, dyspnea on exertion, edema, orthopnea, palpitations, paroxysmal nocturnal dyspnea or shortness of breath ?Dermatological: negative for rash ?Respiratory: negative for cough or wheezing ?Urologic: negative for hematuria ?Abdominal: negative for nausea, vomiting, diarrhea, bright red blood per rectum, melena, or hematemesis ?Neurologic: negative for visual changes, syncope, or dizziness ?All other systems reviewed and are otherwise negative except as noted above. ? ? ? ?Blood pressure 118/60, pulse 64, height '5\' 6"'$  (1.676 m), weight 219 lb 3.2 oz (99.4 kg), SpO2 100 %.  ?General appearance: alert and no distress ?Neck: no adenopathy, no carotid bruit, no JVD, supple, symmetrical, trachea midline, and thyroid not enlarged, symmetric, no tenderness/mass/nodules ?Lungs: clear to auscultation bilaterally ?Heart: regular rate and rhythm, S1, S2 normal, no murmur, click, rub or gallop ?Extremities: extremities normal, atraumatic, no cyanosis or edema ?Pulses: 2+ and symmetric ?Skin: Skin color, texture, turgor normal. No rashes or lesions ?Neurologic: Grossly normal ? ?EKG not performed  today ? ?ASSESSMENT AND PLAN:  ? ?HTN (hypertension) ?History of essential hypertension blood pressure measured today at 118/60.  She is on amlodipine, lisinopril and metoprolol. ? ?Dyslipidemia, goal LDL below 70 ?History of dyslipidemia on high-dose atorvastatin, Repatha and Zetia with lipid profile performed 02/25/2021 revealing total cholesterol of 138, LDL 65 and HDL 52.  She is followed by Dr. Debara Pickett in the lipid clinic. ? ?STEMI - s/p DES to mid AV groove Circ 03/19/13; EF 50% on cath ?History of CAD status post inferior STEMI 03/19/2013 with stenting of her AV groove circumflex by myself.  She presented on 12/27/2020 with chest pain and had a non-STEMI.  She was catheterized the following day by Dr. Fletcher Anon revealed a patent circumflex stent with a 99% stenosis just proximal to this which was restented.  Otherwise she had nonobstructive CAD and normal LV function.  She remains on aspirin and clopidogrel and is asymptomatic. ? ? ? ? ?Lorretta Harp MD FACP,FACC,FAHA, FSCAI ?05/08/2021 ?10:34 AM ?

## 2021-05-08 NOTE — Assessment & Plan Note (Signed)
History of essential hypertension blood pressure measured today at 118/60.  She is on amlodipine, lisinopril and metoprolol. ?

## 2021-05-08 NOTE — Patient Instructions (Signed)
?  Follow-Up: ?At [facility], you and your health needs are our priority.  As part of our continuing mission to provide you with exceptional heart care, we have created designated Provider Care Teams.  These Care Teams include your primary Cardiologist (physician) and Advanced Practice Providers (APPs -  Physician Assistants and Nurse Practitioners) who all work together to provide you with the care you need, when you need it. ? ?We recommend signing up for the patient portal called "MyChart".  Sign up information is provided on this After Visit Summary.  MyChart is used to connect with patients for Virtual Visits (Telemedicine).  Patients are able to view lab/test results, encounter notes, upcoming appointments, etc.  Non-urgent messages can be sent to your provider as well.   ?To learn more about what you can do with MyChart, go to [email or web address].   ? ?Your next appointment:   ?6 month(s) ? ?The format for your next appointment:   ?In Person ? ?Provider:   ?Coletta Memos, FNP or Sande Rives, PA-C    Then, Quay Burow, MD will plan to see you again in 12 month(s).  ? ? ? ? ?Important Information About Sugar ? ? ? ? ?  ?

## 2021-05-08 NOTE — Assessment & Plan Note (Signed)
History of dyslipidemia on high-dose atorvastatin, Repatha and Zetia with lipid profile performed 02/25/2021 revealing total cholesterol of 138, LDL 65 and HDL 52.  She is followed by Dr. Debara Pickett in the lipid clinic. ?

## 2021-08-21 ENCOUNTER — Telehealth: Payer: Self-pay | Admitting: Internal Medicine

## 2021-08-21 NOTE — Telephone Encounter (Signed)
Returned call to patient, patient reports lipid panel drawn by PCP 8/10 (available in epic).  NMR lipoprofile ordered by Dr. Debara Pickett prior to follow up.    Advised lab work by PCP ok, in the future prefer to do the lab work ordered by [employer] specific to lipids.     Advised scheduling team to reach out to arrange follow up.

## 2021-08-21 NOTE — Telephone Encounter (Signed)
Pt called stating she got a call from [place] about having some lab work done. However she got lab work done by her PCP 08/15/21.   She also wanted to discuss a virtual visit with Dr. Debara Pickett.

## 2021-08-23 ENCOUNTER — Other Ambulatory Visit: Payer: Self-pay | Admitting: Family Medicine

## 2021-08-23 DIAGNOSIS — Z1231 Encounter for screening mammogram for malignant neoplasm of breast: Secondary | ICD-10-CM

## 2021-09-18 ENCOUNTER — Ambulatory Visit: Payer: Medicare HMO | Attending: Internal Medicine | Admitting: Internal Medicine

## 2021-09-18 ENCOUNTER — Encounter: Payer: Self-pay | Admitting: Internal Medicine

## 2021-09-18 ENCOUNTER — Telehealth: Payer: Self-pay

## 2021-09-18 VITALS — BP 140/82 | Ht 66.0 in | Wt 215.0 lb

## 2021-09-18 DIAGNOSIS — I214 Non-ST elevation (NSTEMI) myocardial infarction: Secondary | ICD-10-CM | POA: Diagnosis not present

## 2021-09-18 DIAGNOSIS — I2510 Atherosclerotic heart disease of native coronary artery without angina pectoris: Secondary | ICD-10-CM | POA: Diagnosis not present

## 2021-09-18 DIAGNOSIS — Z9861 Coronary angioplasty status: Secondary | ICD-10-CM

## 2021-09-18 DIAGNOSIS — E785 Hyperlipidemia, unspecified: Secondary | ICD-10-CM | POA: Diagnosis not present

## 2021-09-18 DIAGNOSIS — I493 Ventricular premature depolarization: Secondary | ICD-10-CM

## 2021-09-18 MED ORDER — REPATHA SURECLICK 140 MG/ML ~~LOC~~ SOAJ: 1.0000 | SUBCUTANEOUS | 11 refills | Status: DC

## 2021-09-18 NOTE — Progress Notes (Signed)
Virtual Visit via Video Note   This visit type was conducted due to national recommendations for restrictions regarding the COVID-19 Pandemic (e.g. social distancing) in an effort to limit this patient's exposure and mitigate transmission in our community.  Due to her co-morbid illnesses, this patient is at least at moderate risk for complications without adequate follow up.  This format is felt to be most appropriate for this patient at this time.  All issues noted in this document were discussed and addressed.  A limited physical exam was performed with this format.  Please refer to the patient's chart for her consent to telehealth for [facility].      Date:  09/18/2021   ID:  Yvonne Shaw, DOB 07/11/1950, MRN [id number] The patient was identified using 2 identifiers.  Evaluation Performed:  Follow-Up Visit  Patient Location:  [place] [id number]  Provider location:   [address] [place], [place] [address]  PCP:  Elza Rafter, MD  Cardiologist:  Quay Burow, MD Electrophysiologist:  None   Chief Complaint:  Manage dyslipidemia  History of Present Illness:    Yvonne Shaw is a 71 y.o. female who presents via telephone conferencing for a telehealth visit today. This is a pleasant 71 year old female kindly referred by Dr. Gwenlyn Found for evaluation and management of dyslipidemia. She has a history of coronary artery disease with ST elevation MI in 2015 and is status post PCI to the circumflex artery at that time. Since then she is done well. Other risk factors include diabetes, hypertension and obesity. She does have a mixed dyslipidemia. Her last cholesterol numbers were in 7/21 through [facility], indicating total cholesterol 183, LDL 113 and HDL 50. Since then she says she is worked on her diet and is actively exercising more and working on weight loss. Her target LDL is less than 70. Current therapy includes atorvastatin 80 mg daily which she is  tolerating well and has been on since her ST elevation MI.  05/15/2020  Mrs. Broecker was seen today for telephone follow-up.  We could not connect via video chat for unknown reasons.  She did start taking ezetimibe after her last labs.  Repeat labs were performed about 4 days ago.  He has had an expected improvement in her lipids with total cholesterol down to 138, triglycerides 137, HDL 48 and LDL 66.  She seems to be tolerating ezetimibe well.  03/06/2021  Mrs. Bodley returns today for video follow-up.  Currently she is doing well.  She was just seen in follow-up from PCI in January.  In December she had a recurrent non-STEMI with troponin almost 2000 and underwent left heart catheterization.  She was found to have 99% stenosis of the proximal to mid circumflex at the edge of a previously placed mid circumflex stent.  This was successfully treated with PCI and an overlapping stent.  Since then she has had no further chest pain.  Lipids have been fairly stable on combination of ezetimibe and atorvastatin.  Recent total cholesterol 138, triglycerides 114 HDL 52 and LDL 65.  This is down from an LDL of 89 about a year ago before starting ezetimibe.  Although she has been losing weight she has not been able to get the LDL lower than this.  Based on new guidelines in October and the fact that she has had prior MI and a recurrent MI, she is considered very high risk and therefore her target LDL recommendation is now less than  55.  I think it is unlikely she will reach that just with diet alone and we cannot further uptitrate her current medications.  She is a good candidate therefore for PCSK9 inhibitor.  09/18/2021  Yvonne Shaw returns today for follow-up.  Given her recent acute coronary syndrome and revascularization she was considered at very high risk and I previously had target her LDL goal to be less than 55.  We therefore sought approval and started Repatha.  She has been taking that medicine now for  several months.  She says it is well-tolerated.  She did have repeat lipids however in August which surprisingly did not show a significant reduction in her cholesterol rather her LDL actually has increased.  The lab was drawn at [facility] however total cholesterol measured 137, LDL-C of 71, HDL-C of 54 and triglycerides were 59.  It is somewhat surprising that her LDL has actually gone up to 71 from 65.  She does not report any significant dietary changes or significant changes in her lifestyle.   Prior CV studies:   The following studies were reviewed today:  Chart reviewed, lab work  PMHx:  Past Medical History:  Diagnosis Date   Abnormal CT scan, colon 05/21/2015   Anginal pain (HCC)    CAD S/P percutaneous coronary angioplasty 03/19/2013   s/p 2.25 mm x 15 mm (2.46) DES to mid AV Groove Circumflex   CKD (chronic kidney disease)    CVA (cerebral infarction)    Residual R leg weakness   Diabetes mellitus type 2 in obese (HCC)    Fibroid    GERD (gastroesophageal reflux disease)    Hyperlipidemia LDL goal <70    Hypertension    Nonsustained ventricular tachycardia ([place])    found on exam monitoring, asymptomatic   Obesity    Orthopnea    PVC's (premature ventricular contractions)    seen on telemetry during cardiac rehabilitation   Shortness of breath dyspnea    due to brilinta   ST elevation myocardial infarction (STEMI) of inferolateral wall, subsequent episode of care ([place]) 03/19/2013   s/p DES to mid AV Groove Circumflex; EF 50% on cath   Stroke [facility])    2001    Past Surgical History:  Procedure Laterality Date   ABDOMINAL HYSTERECTOMY     CATARACT EXTRACTION W/PHACO Right 09/05/2014   Procedure: CATARACT EXTRACTION PHACO AND INTRAOCULAR LENS PLACEMENT ([place]);  Surgeon: Birder Robson, MD;  Location: [facility] ORS;  Service: Ophthalmology;  Laterality: Right;  [place]: 01:12.2 AP%: 20.1 CDE: 14.45 Lot # [id number]   CHOLECYSTECTOMY     COLONOSCOPY WITH PROPOFOL N/A 07/02/2015    Procedure: COLONOSCOPY WITH PROPOFOL;  Surgeon: Manya Silvas, MD;  Location: [facility] ENDOSCOPY;  Service: Endoscopy;  Laterality: N/A;   COLONOSCOPY WITH PROPOFOL N/A 06/27/2020   Procedure: COLONOSCOPY WITH PROPOFOL;  Surgeon: Robert Bellow, MD;  Location: [facility] ENDOSCOPY;  Service: Endoscopy;  Laterality: N/A;  DM, TAKES PLAVIX   CORONARY STENT INTERVENTION N/A 12/28/2020   Procedure: CORONARY STENT INTERVENTION;  Surgeon: Wellington Hampshire, MD;  Location: [place] CV LAB;  Service: Cardiovascular;  Laterality: N/A;   LEFT HEART CATH AND CORONARY ANGIOGRAPHY N/A 12/28/2020   Procedure: LEFT HEART CATH AND CORONARY ANGIOGRAPHY possible percutaneous intervention;  Surgeon: Wellington Hampshire, MD;  Location: [place] CV LAB;  Service: Cardiovascular;  Laterality: N/A;   LEFT HEART CATHETERIZATION WITH CORONARY ANGIOGRAM N/A 03/19/2013   Procedure: LEFT HEART CATHETERIZATION WITH CORONARY ANGIOGRAM;  Surgeon: Lorretta Harp, MD;  Location: [place] CATH LAB;  Service: Cardiovascular;  Laterality: N/A;   PERCUTANEOUS CORONARY STENT INTERVENTION (PCI-S)  03/19/2013   PCI to mCx - 90% lesion: 2.25 mm x 15 mm Xience Alpine DES (2.46 mm post-dilation); EF ~50%    FAMHx:  Family History  Problem Relation Age of Onset   Cancer Mother    Diabetes Mother    Hypertension Mother    Hypertension Father    Cancer Father    Diabetes Father    Hypertension Sister    Diabetes Sister    Heart failure Brother    Diabetes Brother    Heart attack Maternal Grandfather    Breast cancer Neg Hx     SOCHx:   reports that she has never smoked. She has never used smokeless tobacco. She reports that she does not drink alcohol and does not use drugs.  ALLERGIES:  Allergies  Allergen Reactions   Aggrenox [Aspirin-Dipyridamole Er] Other (See Comments), Nausea Only and Nausea And Vomiting    Severe headache   Carvedilol Other (See Comments)    caused fatigue    MEDS:  Current Meds  Medication Sig    amLODipine (NORVASC) 10 MG tablet Take 1 tablet by mouth daily.   aspirin 81 MG tablet Take 81 mg by mouth daily.   atorvastatin (LIPITOR) 80 MG tablet Take 1 tablet (80 mg total) by mouth daily at 6 PM.   azelastine (ASTELIN) 0.1 % nasal spray Place 1 spray into both nostrils 2 (two) times daily.   Blood Glucose Monitoring Suppl (ONE TOUCH ULTRA SYSTEM KIT) W/DEVICE KIT 1 kit by Does not apply route once.   Cetirizine HCl 10 MG CAPS Take by mouth.   Cholecalciferol (VITAMIN D) 50 MCG (2000 UT) CAPS Take 6,000 Units by mouth daily.   clopidogrel (PLAVIX) 75 MG tablet Take 1 tablet (75 mg total) by mouth daily.   ezetimibe (ZETIA) 10 MG tablet TAKE 1 TABLET EVERY DAY   lisinopril (PRINIVIL,ZESTRIL) 20 MG tablet Take 20 mg by mouth daily.   metoprolol succinate (TOPROL-XL) 25 MG 24 hr tablet Take 25 mg by mouth daily.   nitroGLYCERIN (NITROSTAT) 0.4 MG SL tablet Place 1 tablet (0.4 mg total) under the tongue every 5 (five) minutes x 3 doses as needed for chest pain.   OZEMPIC, 0.25 OR 0.5 MG/DOSE, 2 MG/3ML SOPN Inject into the skin.   [DISCONTINUED] Evolocumab (REPATHA SURECLICK) 578 MG/ML SOAJ Inject 1 Dose into the skin every 14 (fourteen) days.     ROS: Pertinent items noted in HPI and remainder of comprehensive ROS otherwise negative.  Labs/Other Tests and Data Reviewed:    Recent Labs: 12/28/2020: BUN 18; Creatinine, Ser 1.24; Hemoglobin 13.9; Magnesium 2.1; Platelets 267; Potassium 3.9; Sodium 140 03/01/2021: ALT 21   Recent Lipid Panel Lab Results  Component Value Date/Time   CHOL 133 03/01/2021 01:03 PM   CHOL 138 03/01/2021 01:03 PM   TRIG 113 03/01/2021 01:03 PM   TRIG 114 03/01/2021 01:03 PM   HDL 48 03/01/2021 01:03 PM   HDL 52 03/01/2021 01:03 PM   CHOLHDL 2.8 03/01/2021 01:03 PM   CHOLHDL 2.7 03/01/2021 01:03 PM   CHOLHDL 2.9 06/17/2013 02:03 PM   LDLCALC 65 03/01/2021 01:03 PM   LDLCALC 65 03/01/2021 01:03 PM    Wt Readings from Last 3 Encounters:  09/18/21  215 lb (97.5 kg)  05/08/21 219 lb 3.2 oz (99.4 kg)  03/06/21 216 lb (98 kg)     Exam:    Vital Signs:  BP (!) 140/82   Ht _0  (1.676 m)   Wt 215 lb (97.5 kg)   BMI 34.70 kg/m    General appearance: alert and no distress Lungs: No visual respiratory difficulty Abdomen: Obese Extremities: extremities normal, atraumatic, no cyanosis or edema Skin: Skin color, texture, turgor normal. No rashes or lesions Neurologic: Grossly normal  ASSESSMENT & PLAN:    Recent MI with PCI to the circumflex at the site of the edge of a previously placed stent (12/2020) Mixed dyslipidemia, now considered very high risk, goal LDL less than 55 Coronary artery disease status post STEMI in 2015, status post PCI to the circumflex Type 2 diabetes Hypertension Morbid obesity  Ms. Vlachos has had a surprising increase in her LDL cholesterol despite saying that she has been compliant with Repatha every 2 weeks for months.  This would be highly unusual.  We could see some resistance at her lipid profile possibly related to an LP(a) elevation which we should go ahead and check, however I would like for her to at least take 2 more months of the medication since she is due actually tomorrow for her next dose.  We will go ahead and repeat the labs in about a month and consider if we need to make any changes at that time.  COVID-19 Education: The signs and symptoms of COVID-19 were discussed with the patient and how to seek care for testing (follow up with PCP or arrange E-visit).  The importance of social distancing was discussed today.  Patient Risk:   After full review of this patients clinical status, I feel that they are at least moderate risk at this time.  Time:   Today, I have spent 25 minutes with the patient with telehealth technology discussing dyslipidemia, target LDL cholesterol, medication management..     Medication Adjustments/Labs and Tests Ordered: Current medicines are reviewed at length with  the patient today.  Concerns regarding medicines are outlined above.   Tests Ordered: Orders Placed This Encounter  Procedures   Lipoprotein A (LPA)   NMR, lipoprofile    Medication Changes: Meds ordered this encounter  Medications   Evolocumab (REPATHA SURECLICK) 400 MG/ML SOAJ    Sig: Inject 1 Dose into the skin every 14 (fourteen) days.    Dispense:  2 mL    Refill:  11    Disposition:  in 1 year(s)  Pixie Casino, MD, [facility], [place] Director of the Advanced Lipid Disorders &  Cardiovascular Risk Reduction Clinic Diplomate of the American Board of Clinical Lipidology Attending Cardiologist  Direct Dial: [phone]  Fax: [phone]  Website:  [email or web address]  Pixie Casino, MD  09/18/2021 7:36 PM

## 2021-09-18 NOTE — Telephone Encounter (Signed)
I connected with  Yvonne Shaw on 09/18/21 by a video enabled telemedicine application and verified that I am speaking with the correct person using two identifiers.   I discussed the limitations of evaluation and management by telemedicine. The patient expressed understanding and agreed to proceed.

## 2021-09-18 NOTE — Patient Instructions (Signed)
Medication Instructions:  Your physician recommends that you continue on your current medications as directed. Please refer to the Current Medication list given to you today.  *If you need a refill on your cardiac medications before your next appointment, please call your pharmacy*  Lab Work: Please return for FASTING NMR and Lipoprotein A Blood Work in 1 month. You can go any [facility] close to you. If you have any lab test that is abnormal or we need to change your treatment, we will call you to review the results.  Follow-Up: At [facility], you and your health needs are our priority.  As part of our continuing mission to provide you with exceptional heart care, we have created designated Provider Care Teams.  These Care Teams include your primary Cardiologist (physician) and Advanced Practice Providers (APPs -  Physician Assistants and Nurse Practitioners) who all work together to provide you with the care you need, when you need it.  Your next appointment:   12 month(s) with Dr. Debara Pickett.

## 2021-09-20 ENCOUNTER — Ambulatory Visit
Admission: RE | Admit: 2021-09-20 | Discharge: 2021-09-20 | Disposition: A | Discharge status: Home / Self Care | Payer: Medicare HMO | Source: Ambulatory Visit | Attending: Family Medicine | Admitting: Family Medicine

## 2021-09-20 DIAGNOSIS — Z1231 Encounter for screening mammogram for malignant neoplasm of breast: Secondary | ICD-10-CM

## 2021-12-31 NOTE — Progress Notes (Signed)
Cardiology Clinic Note   Patient Name: MATA ROWEN Date of Encounter: 12/31/2021  Primary Care Provider:  Margarita Rana, MD Primary Cardiologist:  Quay Burow, MD  Patient Profile    [name] 71 year old female presents to the clinic today for follow-up evaluation of her coronary artery disease and hypertension.  Past Medical History    Past Medical History:  Diagnosis Date   Abnormal CT scan, colon 05/21/2015   Anginal pain (HCC)    CAD S/P percutaneous coronary angioplasty 03/19/2013   s/p 2.25 mm x 15 mm (2.46) DES to mid AV Groove Circumflex   CKD (chronic kidney disease)    CVA (cerebral infarction)    Residual R leg weakness   Diabetes mellitus type 2 in obese (HCC)    Fibroid    GERD (gastroesophageal reflux disease)    Hyperlipidemia LDL goal <70    Hypertension    Nonsustained ventricular tachycardia ([place])    found on exam monitoring, asymptomatic   Obesity    Orthopnea    PVC's (premature ventricular contractions)    seen on telemetry during cardiac rehabilitation   Shortness of breath dyspnea    due to brilinta   ST elevation myocardial infarction (STEMI) of inferolateral wall, subsequent episode of care ([place]) 03/19/2013   s/p DES to mid AV Groove Circumflex; EF 50% on cath   Stroke [facility])    2001   Past Surgical History:  Procedure Laterality Date   ABDOMINAL HYSTERECTOMY     CATARACT EXTRACTION W/PHACO Right 09/05/2014   Procedure: CATARACT EXTRACTION PHACO AND INTRAOCULAR LENS PLACEMENT ([place]);  Surgeon: Birder Robson, MD;  Location: [facility] ORS;  Service: Ophthalmology;  Laterality: Right;  [place]: 01:12.2 AP%: 20.1 CDE: 14.45 Lot # [id number]   CHOLECYSTECTOMY     COLONOSCOPY WITH PROPOFOL N/A 07/02/2015   Procedure: COLONOSCOPY WITH PROPOFOL;  Surgeon: Manya Silvas, MD;  Location: [facility] ENDOSCOPY;  Service: Endoscopy;  Laterality: N/A;   COLONOSCOPY WITH PROPOFOL N/A 06/27/2020   Procedure: COLONOSCOPY WITH PROPOFOL;  Surgeon: Robert Bellow, MD;  Location: [facility] ENDOSCOPY;  Service: Endoscopy;  Laterality: N/A;  DM, TAKES PLAVIX   CORONARY STENT INTERVENTION N/A 12/28/2020   Procedure: CORONARY STENT INTERVENTION;  Surgeon: Wellington Hampshire, MD;  Location: [place] CV LAB;  Service: Cardiovascular;  Laterality: N/A;   LEFT HEART CATH AND CORONARY ANGIOGRAPHY N/A 12/28/2020   Procedure: LEFT HEART CATH AND CORONARY ANGIOGRAPHY possible percutaneous intervention;  Surgeon: Wellington Hampshire, MD;  Location: [place] CV LAB;  Service: Cardiovascular;  Laterality: N/A;   LEFT HEART CATHETERIZATION WITH CORONARY ANGIOGRAM N/A 03/19/2013   Procedure: LEFT HEART CATHETERIZATION WITH CORONARY ANGIOGRAM;  Surgeon: Lorretta Harp, MD;  Location: [facility] CATH LAB;  Service: Cardiovascular;  Laterality: N/A;   PERCUTANEOUS CORONARY STENT INTERVENTION (PCI-S)  03/19/2013   PCI to mCx - 90% lesion: 2.25 mm x 15 mm Xience Alpine DES (2.46 mm post-dilation); EF ~50%    Allergies  Allergies  Allergen Reactions   Aggrenox [Aspirin-Dipyridamole Er] Other (See Comments), Nausea Only and Nausea And Vomiting    Severe headache   Carvedilol Other (See Comments)    caused fatigue    History of Present Illness    KOLBEE STALLMAN has a PMH of CVA, HTN, HLD, CAD, obesity and 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  She retired in 2020.  She was admitted to [facility] 03/19/2013 with inferior STEMI.  She was in her usual state of health  until 9 PM the previous night.  She began to experience intermittent substernal burning with no radiation or associated symptoms.  She was concerned for indigestion.  She ended up going to sleep and woke up at 6 AM with the same discomfort which had increased to 9 out of 10 in intensity.  She reported associated nausea.  She tried to do activities around her house but her discomfort continued.  She notified her husband and called 911.  Her EKG showed fluctuating ST segment elevation in leads III and aVF.  She was  transported to [facility] ED.  Code STEMI was called.  She received aspirin and a heparin bolus.  She was diverted to [facility] emergency department.  Her cardiac catheterization showed high-grade mid AV groove circumflex stenosis which was stented with DES.  She wore a cardiac event monitor after having frequent PVCs in cardiac rehab.  It showed short runs of NSVT.  After 1 year her Brilinta was transitioned to Plavix due to her shortness of breath.  She underwent repeat cardiac catheterization 12/27/2020 in the setting of chest pain with NSTEMI.  She was noted to have patent mid AV groove circumflex with high-grade lesion just proximal to the previously stented area.  She underwent restenting.  She was noted to have nonobstructive CAD and normal LV function.  She followed up with Dr. Gwenlyn Found on 05/08/2021.  During that time she denied chest pain and shortness of breath.  Her aspirin and Plavix were continued.  She had been seeing Dr. Debara Pickett in the lipid clinic and was continued on atorvastatin, ezetimibe, aspirin, Repatha.  She presents to the clinic today for follow-up evaluation and states***  *** denies chest pain, shortness of breath, lower extremity edema, fatigue, palpitations, melena, hematuria, hemoptysis, diaphoresis, weakness, presyncope, syncope, orthopnea, and PND.  Coronary artery disease-no chest pain today.  Denies recent episodes of arm neck back or chest discomfort.  Underwent cardiac catheterization in the setting of STEMI 2015 and underwent repeat cardiac catheterization 12/22.  She underwent stenting distal to her previous stent (mid AV groove circumflex). Continue ezetimibe, Repatha, atorvastatin Heart healthy low-sodium diet-salty 6 given Increase physical activity as tolerated  Hyperlipidemia-LDL*** Continue ezetimibe, atorvastatin, Repatha, aspirin Heart healthy low-sodium high-fiber diet Increase physical activity as tolerated   Essential hypertension-BP today*** Continue  metoprolol, lisinopril, amlodipine Heart healthy low-sodium diet-salty 6 given Increase physical activity as tolerated  Disposition: Follow-up with Dr.Berry or me in 6-9 months.  Home Medications    Prior to Admission medications   Medication Sig Start Date End Date Taking? Authorizing Provider  amLODipine (NORVASC) 10 MG tablet Take 1 tablet by mouth daily. 09/15/18   [provider]  aspirin 81 MG tablet Take 81 mg by mouth daily.    [provider]  atorvastatin (LIPITOR) 80 MG tablet Take 1 tablet (80 mg total) by mouth daily at 6 PM. 08/10/13   Consuelo Pandy, PA-C  azelastine (ASTELIN) 0.1 % nasal spray Place 1 spray into both nostrils 2 (two) times daily. 08/15/21   [provider]  Blood Glucose Monitoring Suppl (ONE TOUCH ULTRA SYSTEM KIT) W/DEVICE KIT 1 kit by Does not apply route once. 03/21/13   Lyda Jester M, PA-C  Cetirizine HCl 10 MG CAPS Take by mouth. 08/15/21 08/15/22  [provider]  Cholecalciferol (VITAMIN D) 50 MCG (2000 UT) CAPS Take 6,000 Units by mouth daily.    [provider]  Evolocumab (REPATHA SURECLICK) 646 MG/ML SOAJ Inject 1 Dose into the skin every  14 (fourteen) days. 09/18/21   Pixie Casino, MD  ezetimibe (ZETIA) 10 MG tablet TAKE 1 TABLET EVERY DAY 03/15/21   Hilty, Nadean Corwin, MD  lisinopril (PRINIVIL,ZESTRIL) 20 MG tablet Take 20 mg by mouth daily.    [provider]  metoprolol succinate (TOPROL-XL) 25 MG 24 hr tablet Take 25 mg by mouth daily.    [provider]  nitroGLYCERIN (NITROSTAT) 0.4 MG SL tablet Place 1 tablet (0.4 mg total) under the tongue every 5 (five) minutes x 3 doses as needed for chest pain. 01/08/21   Monge, Helane Gunther, NP  OZEMPIC, 0.25 OR 0.5 MG/DOSE, 2 MG/3ML SOPN Inject into the skin. 08/17/21   [provider]    Family History    Family History  Problem Relation Age of Onset   Cancer Mother    Diabetes Mother    Hypertension Mother    Hypertension  Father    Cancer Father    Diabetes Father    Hypertension Sister    Diabetes Sister    Heart failure Brother    Diabetes Brother    Heart attack Maternal Grandfather    Breast cancer Neg Hx    She indicated that her mother is deceased. She indicated that her father is deceased. She indicated that her sister is alive. She indicated that her brother is deceased. She indicated that her maternal grandmother is deceased. She indicated that her maternal grandfather is deceased. She indicated that her paternal grandmother is deceased. She indicated that her paternal grandfather is deceased. She indicated that the status of her neg hx is unknown.  Social History    Social History   Socioeconomic History   Marital status: Married    Spouse name: Not on file   Number of children: Not on file   Years of education: Not on file   Highest education level: Not on file  Occupational History   Not on file  Tobacco Use   Smoking status: Never   Smokeless tobacco: Never  Vaping Use   Vaping Use: Never used  Substance and Sexual Activity   Alcohol use: No   Drug use: No   Sexual activity: Not on file  Other Topics Concern   Not on file  Social History Narrative   Married, 2 healthy children. Lives in [place], [place]. Works at [employer].   Social Determinants of Health   Financial Resource Strain: Not on file  Food Insecurity: Not on file  Transportation Needs: Not on file  Physical Activity: Not on file  Stress: Not on file  Social Connections: Not on file  Intimate Partner Violence: Not on file     Review of Systems    General:  No chills, fever, night sweats or weight changes.  Cardiovascular:  No chest pain, dyspnea on exertion, edema, orthopnea, palpitations, paroxysmal nocturnal dyspnea. Dermatological: No rash, lesions/masses Respiratory: No cough, dyspnea Urologic: No hematuria, dysuria Abdominal:   No nausea, vomiting, diarrhea, bright red blood per rectum, melena, or  hematemesis Neurologic:  No visual changes, wkns, changes in mental status. All other systems reviewed and are otherwise negative except as noted above.  Physical Exam    VS:  There were no vitals taken for this visit. , BMI There is no height or weight on file to calculate BMI. GEN: Well nourished, well developed, in no acute distress. HEENT: normal. Neck: Supple, no JVD, carotid bruits, or masses. Cardiac: RRR, no murmurs, rubs, or gallops. No clubbing, cyanosis, edema.  Radials/DP/PT 2+  and equal bilaterally.  Respiratory:  Respirations regular and unlabored, clear to auscultation bilaterally. GI: Soft, nontender, nondistended, BS + x 4. MS: no deformity or atrophy. Skin: warm and dry, no rash. Neuro:  Strength and sensation are intact. Psych: Normal affect.  Accessory Clinical Findings    Recent Labs: 03/01/2021: ALT 21   Recent Lipid Panel    Component Value Date/Time   CHOL 133 03/01/2021 1303   CHOL 138 03/01/2021 1303   TRIG 113 03/01/2021 1303   TRIG 114 03/01/2021 1303   HDL 48 03/01/2021 1303   HDL 52 03/01/2021 1303   CHOLHDL 2.8 03/01/2021 1303   CHOLHDL 2.7 03/01/2021 1303   CHOLHDL 2.9 06/17/2013 1403   VLDL 16 06/17/2013 1403   LDLCALC 65 03/01/2021 1303   LDLCALC 65 03/01/2021 1303    No BP recorded.  {Refresh Note OR Click here to enter BP  :1}***    ECG personally reviewed by me today- *** - No acute changes  Echocardiogram 12/28/2020 IMPRESSIONS     1. Left ventricular ejection fraction, by estimation, is 60 to 65%. The  left ventricle has normal function. The left ventricle has no regional  wall motion abnormalities. There is mild left ventricular hypertrophy.  Left ventricular diastolic parameters  are consistent with Grade I diastolic dysfunction (impaired relaxation).   2. Right ventricular systolic function is normal. The right ventricular  size is normal.   3. The mitral valve is normal in structure. No evidence of mitral valve   regurgitation. No evidence of mitral stenosis.   4. The aortic valve was not well visualized. Aortic valve regurgitation  is not visualized. No aortic stenosis is present.   5. The inferior vena cava is normal in size with greater than 50%  respiratory variability, suggesting right atrial pressure of 3 mmHg.   FINDINGS   Left Ventricle: Left ventricular ejection fraction, by estimation, is 60  to 65%. The left ventricle has normal function. The left ventricle has no  regional wall motion abnormalities. The left ventricular internal cavity  size was normal in size. There is   mild left ventricular hypertrophy. Left ventricular diastolic parameters  are consistent with Grade I diastolic dysfunction (impaired relaxation).   Right Ventricle: The right ventricular size is normal. No increase in  right ventricular wall thickness. Right ventricular systolic function is  normal.   Left Atrium: Left atrial size was normal in size.   Right Atrium: Right atrial size was normal in size.   Pericardium: There is no evidence of pericardial effusion.   Mitral Valve: The mitral valve is normal in structure. No evidence of  mitral valve regurgitation. No evidence of mitral valve stenosis. MV peak  gradient, 4.2 mmHg. The mean mitral valve gradient is 1.0 mmHg.   Tricuspid Valve: The tricuspid valve is normal in structure. Tricuspid  valve regurgitation is not demonstrated. No evidence of tricuspid  stenosis.   Aortic Valve: The aortic valve was not well visualized. Aortic valve  regurgitation is not visualized. No aortic stenosis is present. Aortic  valve mean gradient measures 3.0 mmHg. Aortic valve peak gradient measures  4.8 mmHg. Aortic valve area, by VTI  measures 2.05 cm.   Pulmonic Valve: The pulmonic valve was normal in structure. Pulmonic valve  regurgitation is not visualized. No evidence of pulmonic stenosis.   Aorta: The aortic root is normal in size and structure.   Venous:  The inferior vena cava is normal in size with greater than 50%  respiratory variability,  suggesting right atrial pressure of 3 mmHg.   IAS/Shunts: No atrial level shunt detected by color flow Doppler.   Assessment & Plan   1.  ***   Jossie Ng. Naveena Eyman NP-C     12/31/2021, 7:31 AM [place] [address] Office [phone] Fax [phone]    I spent***minutes examining this patient, reviewing medications, and using patient centered shared decision making involving her cardiac care.  Prior to her visit I spent greater than 20 minutes reviewing her past medical history,  medications, and prior cardiac tests.

## 2022-01-09 ENCOUNTER — Ambulatory Visit: Payer: Medicare HMO | Attending: General Practice | Admitting: General Practice

## 2022-01-09 ENCOUNTER — Encounter: Payer: Self-pay | Admitting: General Practice

## 2022-01-09 VITALS — BP 128/64 | HR 51 | Ht 66.0 in | Wt 210.2 lb

## 2022-01-09 DIAGNOSIS — Z9861 Coronary angioplasty status: Secondary | ICD-10-CM | POA: Diagnosis not present

## 2022-01-09 DIAGNOSIS — E785 Hyperlipidemia, unspecified: Secondary | ICD-10-CM

## 2022-01-09 DIAGNOSIS — I10 Essential (primary) hypertension: Secondary | ICD-10-CM

## 2022-01-09 DIAGNOSIS — I2510 Atherosclerotic heart disease of native coronary artery without angina pectoris: Secondary | ICD-10-CM

## 2022-01-09 NOTE — Patient Instructions (Signed)
Medication Instructions:  The current medical regimen is effective;  continue present plan and medications as directed. Please refer to the Current Medication list given to you today.  *If you need a refill on your cardiac medications before your next appointment, please call your pharmacy*  Lab Work: NONE If you have labs (blood work) drawn today and your tests are completely normal, you will receive your results only by: [place] (if you have MyChart) OR  A paper copy in the mail If you have any lab test that is abnormal or we need to change your treatment, we will call you to review the results.  Testing/Procedures: NONE  Follow-Up: At [facility], you and your health needs are our priority.  As part of our continuing mission to provide you with exceptional heart care, we have created designated Provider Care Teams.  These Care Teams include your primary Cardiologist (physician) and Advanced Practice Providers (APPs -  Physician Assistants and Nurse Practitioners) who all work together to provide you with the care you need, when you need it.  We recommend signing up for the patient portal called "MyChart".  Sign up information is provided on this After Visit Summary.  MyChart is used to connect with patients for Virtual Visits (Telemedicine).  Patients are able to view lab/test results, encounter notes, upcoming appointments, etc.  Non-urgent messages can be sent to your provider as well.   To learn more about what you can do with MyChart, go to [email or web address].    Your next appointment:   6-8 month(s)  The format for your next appointment:   In Person  Provider:   Quay Burow, MD  or Coletta Memos, FNP      Other Instructions PLEASE READ AND FOLLOW ATTACHED  SALTY 6  CONTINUE BLOOD PRESSURE LOG WITH BP AND PULSE, 1-2 HOURS AFTER TAKING YOUR MEDICATION  MAINTAIN YOUR PHYSICAL ACTIVITY  Important Information About Sugar

## 2022-01-16 ENCOUNTER — Encounter: Payer: Self-pay | Admitting: Internal Medicine

## 2022-04-10 ENCOUNTER — Other Ambulatory Visit: Payer: Self-pay | Admitting: Internal Medicine

## 2022-06-27 ENCOUNTER — Ambulatory Visit: Payer: Medicare HMO | Attending: Cardiovascular Disease | Admitting: Cardiovascular Disease

## 2022-06-27 ENCOUNTER — Encounter: Payer: Self-pay | Admitting: Cardiovascular Disease

## 2022-06-27 VITALS — BP 158/70 | HR 58 | Ht 66.0 in | Wt 216.4 lb

## 2022-06-27 DIAGNOSIS — R001 Bradycardia, unspecified: Secondary | ICD-10-CM | POA: Diagnosis not present

## 2022-06-27 DIAGNOSIS — I2121 ST elevation (STEMI) myocardial infarction involving left circumflex coronary artery: Secondary | ICD-10-CM

## 2022-06-27 DIAGNOSIS — I493 Ventricular premature depolarization: Secondary | ICD-10-CM | POA: Diagnosis not present

## 2022-06-27 DIAGNOSIS — I10 Essential (primary) hypertension: Secondary | ICD-10-CM | POA: Diagnosis not present

## 2022-06-27 DIAGNOSIS — E785 Hyperlipidemia, unspecified: Secondary | ICD-10-CM

## 2022-06-27 MED ORDER — NITROGLYCERIN 0.4 MG SL SUBL: 0.4000 mg | SUBLINGUAL_TABLET | SUBLINGUAL | 3 refills | Status: AC | PRN

## 2022-06-27 NOTE — Assessment & Plan Note (Signed)
History of dyslipidemia on high-dose atorvastatin, Zetia and Repatha with a lipid profile performed 10 months ago revealing total cholesterol 137, LDL 71 and HDL 54.

## 2022-06-27 NOTE — Progress Notes (Signed)
06/27/2022 Yvonne Shaw   [birth year]  [id number]  Primary Physician Dan Humphreys, MD Primary Cardiologist: Runell Gess MD Milagros Loll, Liborio Negrin Torres, [place]  HPI:  Yvonne Shaw is a 72 y.o.  Married [place] American female mother of 2, grandmother and 6 grandchildren he worked as a Engineer, civil (consulting) at [employer], and retired in 2020.. I last saw her in the office 05/08/2021.Marland KitchenShe had no prior cardiac history, PMHx s/f h/o CVA, HTN, HLD and obesity who was admitted to [facility] on 03/19/13 with an inferior STEMI. She reports being in her USOH until ~ 9PM last night when she began experiencing intermittent substernal chest burning w/o radiation or associated symptoms. She was not initially concerned and attributed this to indigestion. She went to sleep and was awoken at 6AM by the same discomfort which had increased to a 9/10 in intensity and with associated nausea. She got up and tried to busy herself around the house, however the discomfort persisted. She alerted her husband who called 911. Serial EKG tracings in the field reveal fluctuating ST segments (2mm elevation to isoelectric) in leads III, aVF. She was transported initially to [facility] ED. Code STEMI was called. She did receive ASA x 4 and heparin bolus. She was diverted to [facility] ED for emergent cardiac catheterization. 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 She was just been discharged 2 days later. Her only problems have been episodic shortness of breath probably related to the Reynolds.she did have an episode of chest pain in May underwent Myoview stress testing at [facility] which was nonischemic. While performing cardiac rehabilitation care it was noted that she had frequent PVCs on telemetry although she was unaware of this.patient wore a 30 day event monitor which did show some short runs of nonsustained ventricular tachycardia. Since I saw her one year ago she has  remained stable. Her Brilenta was changed to Plavix because of shortness of breath .   She  did well until 12/27/2020 when she presented with chest pain and non-STEMI.  She underwent cardiac catheterization following day by Dr. Kirke Corin  revealing a patent mid AV groove circumflex stent with a high-grade lesion just proximal to this which was restented.  She otherwise had nonobstructive CAD and normal LV function.    She does see Dr. Rennis Golden at my request for hyperlipidemia and is on high-dose atorvastatin, Zetia and Repatha with an excellent lipid profile.  Since I saw her a year ago she is remained stable.  Her blood pressures have been trending up and her primary care doctor has adjusted her lisinopril.  She did have 1 episode of nitrate responsive chest pain last weekend however her submental nitroglycerin had expired.   Current Meds  Medication Sig   amLODipine (NORVASC) 10 MG tablet Take 1 tablet by mouth daily.   aspirin 81 MG tablet Take 81 mg by mouth daily.   atorvastatin (LIPITOR) 80 MG tablet Take 1 tablet (80 mg total) by mouth daily at 6 PM.   azelastine (ASTELIN) 0.1 % nasal spray Place 1 spray into both nostrils 2 (two) times daily.   Blood Glucose Monitoring Suppl (ONE TOUCH ULTRA SYSTEM KIT) W/DEVICE KIT 1 kit by Does not apply route once.   Cetirizine HCl 10 MG CAPS Take by mouth.   Cholecalciferol (VITAMIN D) 50 MCG (2000 UT) CAPS Take 6,000 Units by mouth daily.   clopidogrel (PLAVIX) 75 MG tablet Take 75  mg by mouth daily.   Evolocumab (REPATHA SURECLICK) 140 MG/ML SOAJ Inject 1 Dose into the skin every 14 (fourteen) days.   ezetimibe (ZETIA) 10 MG tablet TAKE 1 TABLET EVERY DAY   lisinopril (PRINIVIL,ZESTRIL) 20 MG tablet Take 20 mg by mouth daily.   metoprolol succinate (TOPROL-XL) 25 MG 24 hr tablet Take 25 mg by mouth daily.   nitroGLYCERIN (NITROSTAT) 0.4 MG SL tablet Place 1 tablet (0.4 mg total) under the tongue every 5 (five) minutes x 3 doses as needed for chest pain.      Allergies  Allergen Reactions   Aggrenox [Aspirin-Dipyridamole Er] Other (See Comments), Nausea Only and Nausea And Vomiting    Severe headache   Carvedilol Other (See Comments)    caused fatigue    Social History   Socioeconomic History   Marital status: Married    Spouse name: Not on file   Number of children: Not on file   Years of education: Not on file   Highest education level: Not on file  Occupational History   Not on file  Tobacco Use   Smoking status: Never   Smokeless tobacco: Never  Vaping Use   Vaping Use: Never used  Substance and Sexual Activity   Alcohol use: No   Drug use: No   Sexual activity: Not on file  Other Topics Concern   Not on file  Social History Narrative   Married, 2 healthy children. Lives in [place], [place]. Works at [employer].   Social Determinants of Health   Financial Resource Strain: Not on file  Food Insecurity: Not on file  Transportation Needs: Not on file  Physical Activity: Not on file  Stress: Not on file  Social Connections: Not on file  Intimate Partner Violence: Not on file     Review of Systems: General: negative for chills, fever, night sweats or weight changes.  Cardiovascular: negative for chest pain, dyspnea on exertion, edema, orthopnea, palpitations, paroxysmal nocturnal dyspnea or shortness of breath Dermatological: negative for rash Respiratory: negative for cough or wheezing Urologic: negative for hematuria Abdominal: negative for nausea, vomiting, diarrhea, bright red blood per rectum, melena, or hematemesis Neurologic: negative for visual changes, syncope, or dizziness All other systems reviewed and are otherwise negative except as noted above.    Blood pressure (!) 158/70, pulse (!) 58, height 5\' 6"  (1.676 m), weight 216 lb 6.4 oz (98.2 kg), SpO2 100 %.  General appearance: alert and no distress Neck: no adenopathy, no carotid bruit, no JVD, supple, symmetrical, trachea midline, and thyroid not enlarged,  symmetric, no tenderness/mass/nodules Lungs: clear to auscultation bilaterally Heart: regular rate and rhythm, S1, S2 normal, no murmur, click, rub or gallop Extremities: extremities normal, atraumatic, no cyanosis or edema Pulses: 2+ and symmetric Skin: Skin color, texture, turgor normal. No rashes or lesions Neurologic: Grossly normal  EKG EKG Interpretation  Date/Time:  Friday June 27 2022 11:16:38 EDT Ventricular Rate:  58 PR Interval:  178 QRS Duration: 94 QT Interval:  442 QTC Calculation: 433 R Axis:   8 Text Interpretation: Sinus bradycardia When compared with ECG of 28-Dec-2020 09:10, PREVIOUS ECG IS PRESENT Confirmed by Nanetta Batty [phone]) on 06/27/2022 11:47:10 AM    ASSESSMENT AND PLAN:   HTN (hypertension) History of essential hypertension blood pressure measured today at 158/70.  She says at home it is much lower than this.  Her primary care doctor just increased her lisinopril dose.  She is on amlodipine, Toprol and lisinopril.  I am going to have  her keep a 30 of blood pressure log and come in to see a Pharm.D. in 4 weeks to review.  Dyslipidemia, goal LDL below 70 History of dyslipidemia on high-dose atorvastatin, Zetia and Repatha with a lipid profile performed 10 months ago revealing total cholesterol 137, LDL 71 and HDL 54.  STEMI - s/p DES to mid AV groove Circ 03/19/13; EF 50% on cath History of CAD status post inferior STEMI 03/19/2013.  She was awoken at 6 AM, called 911 and transported to [facility] where she was noted to have ST segment elevation in 3 and F.  I catheterized her revealing high-grade mid AV groove circumflex stenosis which I stented using a drug-eluting stent.  She was discharged home 2 days later.  She had a non-STEMI 12/27/2020 cardiac catheterization performed by Dr. Kirke Corin revealed a patent mid AV circumflex stent with high-grade lesion just proximal to this which was restented.  She had nonobstructive disease in her coronary circulation otherwise  and normal LV function.  She has remained on dual antiplatelet therapy.  She did have an episode of Maitri responsive chest pain that last weekend but otherwise has been pain-free.     Runell Gess MD FACP,FACC,FAHA, [facility] 06/27/2022 11:56 AM

## 2022-06-27 NOTE — Assessment & Plan Note (Signed)
History of CAD status post inferior STEMI 03/19/2013.  She was awoken at 6 AM, called 911 and transported to [facility] where she was noted to have ST segment elevation in 3 and F.  I catheterized her revealing high-grade mid AV groove circumflex stenosis which I stented using a drug-eluting stent.  She was discharged home 2 days later.  She had a non-STEMI 12/27/2020 cardiac catheterization performed by Dr. Kirke Corin revealed a patent mid AV circumflex stent with high-grade lesion just proximal to this which was restented.  She had nonobstructive disease in her coronary circulation otherwise and normal LV function.  She has remained on dual antiplatelet therapy.  She did have an episode of Maitri responsive chest pain that last weekend but otherwise has been pain-free.

## 2022-06-27 NOTE — Assessment & Plan Note (Signed)
History of essential hypertension blood pressure measured today at 158/70.  She says at home it is much lower than this.  Her primary care doctor just increased her lisinopril dose.  She is on amlodipine, Toprol and lisinopril.  I am going to have her keep a 30 of blood pressure log and come in to see a Pharm.D. in 4 weeks to review.

## 2022-06-27 NOTE — Patient Instructions (Signed)
Medication Instructions:  Your physician recommends that you continue on your current medications as directed. Please refer to the Current Medication list given to you today.  *If you need a refill on your cardiac medications before your next appointment, please call your pharmacy*   Follow-Up: At [facility], you and your health needs are our priority.  As part of our continuing mission to provide you with exceptional heart care, we have created designated Provider Care Teams.  These Care Teams include your primary Cardiologist (physician) and Advanced Practice Providers (APPs -  Physician Assistants and Nurse Practitioners) who all work together to provide you with the care you need, when you need it.  We recommend signing up for the patient portal called "MyChart".  Sign up information is provided on this After Visit Summary.  MyChart is used to connect with patients for Virtual Visits (Telemedicine).  Patients are able to view lab/test results, encounter notes, upcoming appointments, etc.  Non-urgent messages can be sent to your provider as well.   To learn more about what you can do with MyChart, go to [email or web address].    Your next appointment:   3 month(s)  Provider:   Edd Fabian, FNP      Then, Nanetta Batty, MD will plan to see you again in 12 month(s).    Other Instructions Dr. Allyson Sabal has requested that you schedule an appointment with one of our clinical pharmacists for a blood pressure check appointment within the next 4 weeks.  If you monitor your blood pressure (BP) at home, please bring your BP cuff and your BP readings with you to this appointment  HOW TO TAKE YOUR BLOOD PRESSURE: Rest 5 minutes before taking your blood pressure. Don't smoke or drink caffeinated beverages for at least 30 minutes before. Take your blood pressure before (not after) you eat. Sit comfortably with your back supported and both feet on the floor (don't cross your  legs). Elevate your arm to heart level on a table or a desk. Use the proper sized cuff. It should fit smoothly and snugly around your bare upper arm. There should be enough room to slip a fingertip under the cuff. The bottom edge of the cuff should be 1 inch above the crease of the elbow. Ideally, take 3 measurements at one sitting and record the average.

## 2022-07-22 ENCOUNTER — Other Ambulatory Visit: Payer: Self-pay | Admitting: Internal Medicine

## 2022-07-22 DIAGNOSIS — E785 Hyperlipidemia, unspecified: Secondary | ICD-10-CM

## 2022-07-24 ENCOUNTER — Ambulatory Visit: Payer: Medicare HMO | Attending: Internal Medicine | Admitting: Student

## 2022-07-24 ENCOUNTER — Encounter: Payer: Self-pay | Admitting: Student

## 2022-07-24 VITALS — BP 132/74 | HR 56

## 2022-07-24 DIAGNOSIS — I10 Essential (primary) hypertension: Secondary | ICD-10-CM | POA: Diagnosis not present

## 2022-07-24 MED ORDER — LISINOPRIL 40 MG PO TABS: 40.0000 mg | ORAL_TABLET | Freq: Every day | ORAL | Status: AC

## 2022-07-24 NOTE — Assessment & Plan Note (Signed)
Assessment: In office BP 158/68 heart rate 57 upon repeating it went down to 132/75 heart rate 57(goal <130/80). Home BP on manual cuff ~135-140/ 75-83 range  Tolerates current  BP medications well without any side effects Denies SOB, palpitation, chest pain, headaches,or swelling   Plan:  Given near goal BP in office not making any medication changes  In future can consider switching hydrochlorothiazide to chlorthalidone to get better BP lowering effect Continue taking amlodipine 10 mg daily, lisinopril 40 mg daily, metoprolol ER 25 mg daily, hydrochlorothiazide 25 mg daily.  Patient to keep record of BP readings with heart rate and report to [place] at the next visit Patient to see PharmD in 4 weeks for follow up  Bring home cuff for validation at the next OV Follow up lab(s):none

## 2022-07-24 NOTE — Patient Instructions (Addendum)
Changes made by your pharmacist Carmela Hurt, PharmD at today's visit:    Instructions/Changes  (what do you need to do) Your Notes  (what you did and when you did it)  Continue taking current BP medications    Cut back on salt intake    Exercise regularly     Bring all of your meds, your BP cuff and your record of home blood pressures to your next appointment.    HOW TO TAKE YOUR BLOOD PRESSURE AT HOME  Rest 5 minutes before taking your blood pressure.  Don't smoke or drink caffeinated beverages for at least 30 minutes before. Take your blood pressure before (not after) you eat. Sit comfortably with your back supported and both feet on the floor (don't cross your legs). Elevate your arm to heart level on a table or a desk. Use the proper sized cuff. It should fit smoothly and snugly around your bare upper arm. There should be enough room to slip a fingertip under the cuff. The bottom edge of the cuff should be 1 inch above the crease of the elbow. Ideally, take 3 measurements at one sitting and record the average.  Important lifestyle changes to control high blood pressure  Intervention  Effect on the BP  Lose extra pounds and watch your waistline Weight loss is one of the most effective lifestyle changes for controlling blood pressure. If you're overweight or obese, losing even a small amount of weight can help reduce blood pressure. Blood pressure might go down by about 1 millimeter of mercury (mm Hg) with each kilogram (about 2.2 pounds) of weight lost.  Exercise regularly As a general goal, aim for at least 30 minutes of moderate physical activity every day. Regular physical activity can lower high blood pressure by about 5 to 8 mm Hg.  Eat a healthy diet Eating a diet rich in whole grains, fruits, vegetables, and low-fat dairy products and low in saturated fat and cholesterol. A healthy diet can lower high blood pressure by up to 11 mm Hg.  Reduce salt (sodium) in your diet  Even a small reduction of sodium in the diet can improve heart health and reduce high blood pressure by about 5 to 6 mm Hg.  Limit alcohol One drink equals 12 ounces of beer, 5 ounces of wine, or 1.5 ounces of 80-proof liquor.  Limiting alcohol to less than one drink a day for women or two drinks a day for men can help lower blood pressure by about 4 mm Hg.   If you have any questions or concerns please use My Chart to send questions or call the office at [phone]

## 2022-07-24 NOTE — Progress Notes (Signed)
Patient ID: Yvonne Shaw                 DOB: [birth year]                      MRN: [id number]      HPI: Yvonne Shaw is a 72 y.o. female referred by Dr. Allyson Sabal  to HTN clinic. PMH is significant forh/o CVA, HTN, HLD and obesity who was admitted to [facility] on 03/19/13 with an inferior STEMI. 12/27/2020 non-STEMI.  Her blood pressures have been trending up and her primary care doctor has adjusted her lisinopril hydrochlorothiazide 20/25 to lisinopril 40 mg.   Patient saw Dr. Allyson Sabal on 06/18/2022 her BP was elevated, patient was advised to keep log of BP readings at home and to see PharmD in 4 weeks  Today patient presented for HTN clinic. Her home BP ~ 135-140/ 75-83 range, her husband checks it on manual cuff. She hardly eats any salt  but there are some food she likes with salt however she adds very small amount. During summer months she does not go out for walk. But she does chair yoga and other starches regularly.   Current HTN meds: amlodipine 10 mg daily, lisinopril 40 mg daily, metoprolol ER 25 mg daily, hydrochlorothiazide 25 mg daily.  Previously tried: carvedilol- fatigue BP goal: <130/80  Family History:  Relation Problem Comments  Mother (Deceased) Cancer   Diabetes   Hypertension     Father (Deceased) Cancer   Diabetes   Hypertension     Sister Metallurgist) Diabetes   Hypertension     Brother (Deceased) Diabetes   Heart failure     Maternal Grandmother (Deceased)   Maternal Grandfather (Deceased) Heart attack     Paternal Grandmother (Deceased)   Paternal Grandfather (Deceased)     Social History:  Alcohol: none Smoking: never   Diet: low salt diet, does not cook food with salt  Eat out once or twice week   Exercise: once or twice week - chair yoga    Home BP readings: 135-140/ 75-83   Wt Readings from Last 3 Encounters:  06/27/22 216 lb 6.4 oz (98.2 kg)  01/09/22 210 lb 3.2 oz (95.3 kg)  09/18/21 215 lb (97.5 kg)   BP Readings from Last 3 Encounters:   07/24/22 132/74  06/27/22 (!) 158/70  01/09/22 128/64   Pulse Readings from Last 3 Encounters:  07/24/22 (!) 56  06/27/22 (!) 58  01/09/22 (!) 51    Renal function: CrCl cannot be calculated (Patient's most recent lab result is older than the maximum 21 days allowed.).  Past Medical History:  Diagnosis Date   Abnormal CT scan, colon 05/21/2015   Anginal pain (HCC)    CAD S/P percutaneous coronary angioplasty 03/19/2013   s/p 2.25 mm x 15 mm (2.46) DES to mid AV Groove Circumflex   CKD (chronic kidney disease)    CVA (cerebral infarction)    Residual R leg weakness   Diabetes mellitus type 2 in obese    Fibroid    GERD (gastroesophageal reflux disease)    Hyperlipidemia LDL goal <70    Hypertension    Nonsustained ventricular tachycardia (HCC)    found on exam monitoring, asymptomatic   Obesity    Orthopnea    PVC's (premature ventricular contractions)    seen on telemetry during cardiac rehabilitation   Shortness of breath dyspnea    due to brilinta   ST elevation myocardial infarction (STEMI)  of inferolateral wall, subsequent episode of care (HCC) 03/19/2013   s/p DES to mid AV Groove Circumflex; EF 50% on cath   Stroke [facility])    2001    Current Outpatient Medications on File Prior to Visit  Medication Sig Dispense Refill   amLODipine (NORVASC) 10 MG tablet Take 1 tablet by mouth daily.     aspirin 81 MG tablet Take 81 mg by mouth daily.     atorvastatin (LIPITOR) 80 MG tablet Take 1 tablet (80 mg total) by mouth daily at 6 PM. 30 tablet 5   azelastine (ASTELIN) 0.1 % nasal spray Place 1 spray into both nostrils 2 (two) times daily.     Blood Glucose Monitoring Suppl (ONE TOUCH ULTRA SYSTEM KIT) W/DEVICE KIT 1 kit by Does not apply route once. 1 each 0   Cetirizine HCl 10 MG CAPS Take by mouth.     Cholecalciferol (VITAMIN D) 50 MCG (2000 UT) CAPS Take 6,000 Units by mouth daily.     clopidogrel (PLAVIX) 75 MG tablet Take 75 mg by mouth daily.     Evolocumab  (REPATHA SURECLICK) 140 MG/ML SOAJ Inject 1 Dose into the skin every 14 (fourteen) days. 2 mL 11   ezetimibe (ZETIA) 10 MG tablet TAKE 1 TABLET EVERY DAY 90 tablet 3   metoprolol succinate (TOPROL-XL) 25 MG 24 hr tablet Take 25 mg by mouth daily.     nitroGLYCERIN (NITROSTAT) 0.4 MG SL tablet Place 1 tablet (0.4 mg total) under the tongue every 5 (five) minutes x 3 doses as needed for chest pain. 25 tablet 3   No current facility-administered medications on file prior to visit.    Allergies  Allergen Reactions   Aggrenox [Aspirin-Dipyridamole Er] Other (See Comments), Nausea Only and Nausea And Vomiting    Severe headache   Carvedilol Other (See Comments)    caused fatigue    Blood pressure 132/74, pulse (!) 56, SpO2 100%.   Assessment/Plan:  1. Hypertension -  HTN (hypertension) Assessment: In office BP 158/68 heart rate 57 upon repeating it went down to 132/75 heart rate 57(goal <130/80). Home BP on manual cuff ~135-140/ 75-83 range  Tolerates current  BP medications well without any side effects Denies SOB, palpitation, chest pain, headaches,or swelling   Plan:  Given near goal BP in office not making any medication changes  In future can consider switching hydrochlorothiazide to chlorthalidone to get better BP lowering effect Continue taking amlodipine 10 mg daily, lisinopril 40 mg daily, metoprolol ER 25 mg daily, hydrochlorothiazide 25 mg daily.  Patient to keep record of BP readings with heart rate and report to [place] at the next visit Patient to see PharmD in 4 weeks for follow up  Bring home cuff for validation at the next OV Follow up lab(s):none    Thank you  Carmela Hurt, Pharm.D [facility] of [facility] [address], [place], [place] [address]  Phone: [phone]; Fax: [phone]

## 2022-08-18 ENCOUNTER — Encounter: Payer: Self-pay | Admitting: Student

## 2022-08-18 ENCOUNTER — Ambulatory Visit: Payer: Medicare HMO | Attending: Cardiology | Admitting: Student

## 2022-08-18 VITALS — BP 151/73 | HR 54

## 2022-08-18 DIAGNOSIS — I10 Essential (primary) hypertension: Secondary | ICD-10-CM

## 2022-08-18 MED ORDER — REPATHA SURECLICK 140 MG/ML ~~LOC~~ SOAJ: 140.0000 mg | SUBCUTANEOUS | 3 refills | Status: AC

## 2022-08-18 MED ORDER — CHLORTHALIDONE 25 MG PO TABS: 25.0000 mg | ORAL_TABLET | Freq: Every day | ORAL | 3 refills | Status: AC

## 2022-08-18 NOTE — Patient Instructions (Addendum)
Changes made by your pharmacist Carmela Hurt, PharmD at today's visit:    Instructions/Changes  (what do you need to do) Your Notes  (what you did and when you did it)  Stop taking hydrochlorothiazide 25 mg daily start taking chlorthalidone 25 mg daily    Continue taking amlodipine 10 mg daily, lisinopril 40 mg daily, metoprolol ER 25 mg daily    Keep up with your good work with Low salt intake and regular exercise    Bring all of your meds, your BP cuff and your record of home blood pressures to your next appointment.    HOW TO TAKE YOUR BLOOD PRESSURE AT HOME  Rest 5 minutes before taking your blood pressure.  Don't smoke or drink caffeinated beverages for at least 30 minutes before. Take your blood pressure before (not after) you eat. Sit comfortably with your back supported and both feet on the floor (don't cross your legs). Elevate your arm to heart level on a table or a desk. Use the proper sized cuff. It should fit smoothly and snugly around your bare upper arm. There should be enough room to slip a fingertip under the cuff. The bottom edge of the cuff should be 1 inch above the crease of the elbow. Ideally, take 3 measurements at one sitting and record the average.  Important lifestyle changes to control high blood pressure  Intervention  Effect on the BP  Lose extra pounds and watch your waistline Weight loss is one of the most effective lifestyle changes for controlling blood pressure. If you're overweight or obese, losing even a small amount of weight can help reduce blood pressure. Blood pressure might go down by about 1 millimeter of mercury (mm Hg) with each kilogram (about 2.2 pounds) of weight lost.  Exercise regularly As a general goal, aim for at least 30 minutes of moderate physical activity every day. Regular physical activity can lower high blood pressure by about 5 to 8 mm Hg.  Eat a healthy diet Eating a diet rich in whole grains, fruits, vegetables, and  low-fat dairy products and low in saturated fat and cholesterol. A healthy diet can lower high blood pressure by up to 11 mm Hg.  Reduce salt (sodium) in your diet Even a small reduction of sodium in the diet can improve heart health and reduce high blood pressure by about 5 to 6 mm Hg.  Limit alcohol One drink equals 12 ounces of beer, 5 ounces of wine, or 1.5 ounces of 80-proof liquor.  Limiting alcohol to less than one drink a day for women or two drinks a day for men can help lower blood pressure by about 4 mm Hg.   If you have any questions or concerns please use My Chart to send questions or call the office at [phone]

## 2022-08-18 NOTE — Progress Notes (Signed)
Patient ID: Yvonne Shaw                 DOB: Dec 24, 1950                      MRN: [id number]      HPI: Yvonne Shaw is a 72 y.o. female referred by Dr. Allyson Sabal  to HTN clinic. PMH is significant forh/o CVA, HTN, HLD and obesity who was admitted to [facility] on 03/19/13 with an inferior STEMI. 12/27/2020 non-STEMI.  Her blood pressures have been trending up and her primary care doctor has adjusted her lisinopril hydrochlorothiazide 20/25 to lisinopril 40 mg.   Patient saw Dr. Allyson Sabal on 06/18/2022 her BP was elevated, patient was advised to keep log of BP readings at home and to see PharmD in 4 weeks. At last visit with PharmD BP was controlled so no medications were changed.  Today patient presented in good spirit for follow up her Home BP ~ 128/78 range most days however there were some readings ~ 135/80 range. Patient still uses manual cuff - husband was paramedics so home readings are accurate per patient. She is still exercising regularly and  following low salt diet.    Current HTN meds: amlodipine 10 mg daily, lisinopril 40 mg daily, metoprolol ER 25 mg daily, hydrochlorothiazide 25 mg daily.  Previously tried: carvedilol- fatigue BP goal: <130/80  Family History:  Relation Problem Comments  Mother (Deceased) Cancer   Diabetes   Hypertension     Father (Deceased) Cancer   Diabetes   Hypertension     Sister Metallurgist) Diabetes   Hypertension     Brother (Deceased) Diabetes   Heart failure     Maternal Grandmother (Deceased)   Maternal Grandfather (Deceased) Heart attack     Paternal Grandmother (Deceased)   Paternal Grandfather (Deceased)     Social History:  Alcohol: none Smoking: never   Diet: low salt diet, does not cook food with salt  Eat out once or twice week   Exercise: once or twice week - chair yoga    Home BP readings: 135-140/ 75-83   Wt Readings from Last 3 Encounters:  06/27/22 216 lb 6.4 oz (98.2 kg)  01/09/22 210 lb 3.2 oz (95.3 kg)  09/18/21 215  lb (97.5 kg)   BP Readings from Last 3 Encounters:  07/24/22 132/74  06/27/22 (!) 158/70  01/09/22 128/64   Pulse Readings from Last 3 Encounters:  07/24/22 (!) 56  06/27/22 (!) 58  01/09/22 (!) 51    Renal function: CrCl cannot be calculated (Patient's most recent lab result is older than the maximum 21 days allowed.).  Past Medical History:  Diagnosis Date   Abnormal CT scan, colon 05/21/2015   Anginal pain (HCC)    CAD S/P percutaneous coronary angioplasty 03/19/2013   s/p 2.25 mm x 15 mm (2.46) DES to mid AV Groove Circumflex   CKD (chronic kidney disease)    CVA (cerebral infarction)    Residual R leg weakness   Diabetes mellitus type 2 in obese    Fibroid    GERD (gastroesophageal reflux disease)    Hyperlipidemia LDL goal <70    Hypertension    Nonsustained ventricular tachycardia (HCC)    found on exam monitoring, asymptomatic   Obesity    Orthopnea    PVC's (premature ventricular contractions)    seen on telemetry during cardiac rehabilitation   Shortness of breath dyspnea    due to brilinta  ST elevation myocardial infarction (STEMI) of inferolateral wall, subsequent episode of care (HCC) 03/19/2013   s/p DES to mid AV Groove Circumflex; EF 50% on cath   Stroke [facility])    2001    Current Outpatient Medications on File Prior to Visit  Medication Sig Dispense Refill   amLODipine (NORVASC) 10 MG tablet Take 1 tablet by mouth daily.     aspirin 81 MG tablet Take 81 mg by mouth daily.     atorvastatin (LIPITOR) 80 MG tablet Take 1 tablet (80 mg total) by mouth daily at 6 PM. 30 tablet 5   azelastine (ASTELIN) 0.1 % nasal spray Place 1 spray into both nostrils 2 (two) times daily.     Blood Glucose Monitoring Suppl (ONE TOUCH ULTRA SYSTEM KIT) W/DEVICE KIT 1 kit by Does not apply route once. 1 each 0   Cetirizine HCl 10 MG CAPS Take by mouth.     Cholecalciferol (VITAMIN D) 50 MCG (2000 UT) CAPS Take 6,000 Units by mouth daily.     clopidogrel (PLAVIX) 75 MG  tablet Take 75 mg by mouth daily.     Evolocumab (REPATHA SURECLICK) 140 MG/ML SOAJ Inject 1 Dose into the skin every 14 (fourteen) days. 2 mL 11   ezetimibe (ZETIA) 10 MG tablet TAKE 1 TABLET EVERY DAY 90 tablet 3   lisinopril (ZESTRIL) 40 MG tablet Take 1 tablet (40 mg total) by mouth daily.     metoprolol succinate (TOPROL-XL) 25 MG 24 hr tablet Take 25 mg by mouth daily.     nitroGLYCERIN (NITROSTAT) 0.4 MG SL tablet Place 1 tablet (0.4 mg total) under the tongue every 5 (five) minutes x 3 doses as needed for chest pain. 25 tablet 3   No current facility-administered medications on file prior to visit.    Allergies  Allergen Reactions   Aggrenox [Aspirin-Dipyridamole Er] Other (See Comments), Nausea Only and Nausea And Vomiting    Severe headache   Carvedilol Other (See Comments)    caused fatigue    There were no vitals taken for this visit.   Assessment/Plan:  1. Hypertension -  No problem-specific Assessment & Plan notes found for this encounter.   Thank you  Carmela Hurt, Pharm.D [facility] Division of [facility] [address], [place], [place] [address]  Phone: [phone]; Fax: [phone]

## 2022-08-18 NOTE — Assessment & Plan Note (Signed)
Assessment: In office BP 158/75 upon repeating 151/73 heart rate 54 (goal <130/80) Home BP on manual cuff ~128-135/ 80 range - uses manual cuff  Reports she has white coat syndrome  Tolerates current  BP medications well without any side effects Denies SOB, palpitation, chest pain, headaches,or swelling   Plan:  Given slightly above goal BP will switch hydrochlorothiazide to chlorthalidone to get better BP lowering effect Continue taking amlodipine 10 mg daily, lisinopril 40 mg daily, metoprolol ER 25 mg daily,  Patient to keep record of BP readings with heart rate and report to [place] at the next visit Patient to see PharmD in 4 weeks for follow up  Follow up lab(s):BMP in 2 weeks

## 2022-09-11 ENCOUNTER — Telehealth: Payer: Self-pay | Admitting: Pharmacist

## 2022-09-11 NOTE — Telephone Encounter (Signed)
Spoke to pt, discussed the lab result, reported she was not drinking enough water, will improve on water intake. Follow up with pharmacist scheduled on 09/16

## 2022-09-15 ENCOUNTER — Telehealth: Payer: Self-pay | Admitting: Internal Medicine

## 2022-09-15 NOTE — Telephone Encounter (Signed)
Patient called to have lab orders for blood work sent to [facility] in [place] as she will need to have lab work done prior to visit on Thursday, 9/12.

## 2022-09-15 NOTE — Telephone Encounter (Signed)
Spoke with patient. Advised that labs are active for [facility] (NMR, LPa) and she should complete tomorrow so they can be ready for 9/12 appointment. Of note, lab reminder was sent 07/22/22

## 2022-09-18 ENCOUNTER — Ambulatory Visit: Payer: Medicare HMO | Attending: Internal Medicine | Admitting: Internal Medicine

## 2022-09-18 ENCOUNTER — Ambulatory Visit: Payer: Medicare HMO

## 2022-09-18 ENCOUNTER — Encounter: Payer: Self-pay | Admitting: Internal Medicine

## 2022-09-18 DIAGNOSIS — Z9861 Coronary angioplasty status: Secondary | ICD-10-CM | POA: Diagnosis not present

## 2022-09-18 DIAGNOSIS — I2510 Atherosclerotic heart disease of native coronary artery without angina pectoris: Secondary | ICD-10-CM

## 2022-09-18 DIAGNOSIS — E7841 Elevated Lipoprotein(a): Secondary | ICD-10-CM | POA: Diagnosis not present

## 2022-09-18 DIAGNOSIS — E785 Hyperlipidemia, unspecified: Secondary | ICD-10-CM | POA: Diagnosis not present

## 2022-09-18 NOTE — Patient Instructions (Signed)
Medication Instructions:  NO CHANGES  *If you need a refill on your cardiac medications before your next appointment, please call your pharmacy*   Lab Work: FASTING lab work in 1 year  If you have labs (blood work) drawn today and your tests are completely normal, you will receive your results only by: MyChart Message (if you have MyChart) OR A paper copy in the mail If you have any lab test that is abnormal or we need to change your treatment, we will call you to review the results.   Follow-Up: At [facility], you and your health needs are our priority.  As part of our continuing mission to provide you with exceptional heart care, we have created designated Provider Care Teams.  These Care Teams include your primary Cardiologist (physician) and Advanced Practice Providers (APPs -  Physician Assistants and Nurse Practitioners) who all work together to provide you with the care you need, when you need it.  We recommend signing up for the patient portal called "MyChart".  Sign up information is provided on this After Visit Summary.  MyChart is used to connect with patients for Virtual Visits (Telemedicine).  Patients are able to view lab/test results, encounter notes, upcoming appointments, etc.  Non-urgent messages can be sent to your provider as well.   To learn more about what you can do with MyChart, go to [email or web address].    Your next appointment:   12 months with Dr. Rennis Golden -- lipid clinic  Other Instructions We will submit your name for screening for the African Heart Study. The clinical research team may be in contact with you about this.

## 2022-09-18 NOTE — Progress Notes (Signed)
Virtual Visit via Video Note   This visit type was conducted due to national recommendations for restrictions regarding the COVID-19 Pandemic (e.g. social distancing) in an effort to limit this patient's exposure and mitigate transmission in our community.  Due to her co-morbid illnesses, this patient is at least at moderate risk for complications without adequate follow up.  This format is felt to be most appropriate for this patient at this time.  All issues noted in this document were discussed and addressed.  A limited physical exam was performed with this format.  Please refer to the patient's chart for her consent to telehealth for [facility].      Date:  09/18/2022   ID:  Yvonne Shaw, DOB 1950/06/29, MRN [id number] The patient was identified using 2 identifiers.  Evaluation Performed:  Follow-Up Visit  Patient Location:  [address] [place] [address]  Provider location:   [address], [address] [place], [place] [address]  PCP:  Dan Humphreys, MD  Cardiologist:  Nanetta Batty, MD Electrophysiologist:  None   Chief Complaint:  Manage dyslipidemia  History of Present Illness:    Yvonne Shaw is a 72 y.o. female who presents via telephone conferencing for a telehealth visit today. This is a pleasant 72 year old female kindly referred by Dr. Allyson Sabal for evaluation and management of dyslipidemia. She has a history of coronary artery disease with ST elevation MI in 2015 and is status post PCI to the circumflex artery at that time. Since then she is done well. Other risk factors include diabetes, hypertension and obesity. She does have a mixed dyslipidemia. Her last cholesterol numbers were in 7/21 through [facility], indicating total cholesterol 183, LDL 113 and HDL 50. Since then she says she is worked on her diet and is actively exercising more and working on weight loss. Her target LDL is less than 70. Current therapy includes atorvastatin 80 mg daily which she is  tolerating well and has been on since her ST elevation MI.  05/15/2020  Yvonne Shaw was seen today for telephone follow-up.  We could not connect via video chat for unknown reasons.  She did start taking ezetimibe after her last labs.  Repeat labs were performed about 4 days ago.  He has had an expected improvement in her lipids with total cholesterol down to 138, triglycerides 137, HDL 48 and LDL 66.  She seems to be tolerating ezetimibe well.  03/06/2021  Yvonne Shaw returns today for video follow-up.  Currently she is doing well.  She was just seen in follow-up from PCI in January.  In December she had a recurrent non-STEMI with troponin almost 2000 and underwent left heart catheterization.  She was found to have 99% stenosis of the proximal to mid circumflex at the edge of a previously placed mid circumflex stent.  This was successfully treated with PCI and an overlapping stent.  Since then she has had no further chest pain.  Lipids have been fairly stable on combination of ezetimibe and atorvastatin.  Recent total cholesterol 138, triglycerides 114 HDL 52 and LDL 65.  This is down from an LDL of 89 about a year ago before starting ezetimibe.  Although she has been losing weight she has not been able to get the LDL lower than this.  Based on new guidelines in October and the fact that she has had prior MI and a recurrent MI, she is considered very high risk and therefore her target LDL recommendation is now less  than 55.  I think it is unlikely she will reach that just with diet alone and we cannot further uptitrate her current medications.  She is a good candidate therefore for PCSK9 inhibitor.  09/18/2021  Yvonne Shaw returns today for follow-up.  Given her recent acute coronary syndrome and revascularization she was considered at very high risk and I previously had target her LDL goal to be less than 55.  We therefore sought approval and started Repatha.  She has been taking that medicine now for  several months.  She says it is well-tolerated.  She did have repeat lipids however in August which surprisingly did not show a significant reduction in her cholesterol rather her LDL actually has increased.  The lab was drawn at [facility] however total cholesterol measured 137, LDL-C of 71, HDL-C of 54 and triglycerides were 59.  It is somewhat surprising that her LDL has actually gone up to 71 from 65.  She does not report any significant dietary changes or significant changes in her lifestyle.  09/18/2022  Yvonne Shaw is seen today in follow-up.  She continues to do well on her lipid-lowering therapy.  Her LDL particle number is [id number] with an LDL 65, HDL 59 and triglycerides 71.  Small LDL particle number is 424.  Overall she is tolerating this therapy well.  I did obtain an LP(a) which is not surprisingly elevated at 172 nmol/L.  I do not believe this was previously assessed.  Typically the reduction on Repatha and LP(a) is about 20 to 30% therefore I suspect it may have been over 200 initially.  There are no additional therapies that are commercially available to lower LPA at this time however several error in research and we do have an ongoing clinical trial now called the African heart study which aims to assess the natural history of this particularly in different racial groups.  Prior CV studies:   The following studies were reviewed today:  Chart reviewed, lab work  PMHx:  Past Medical History:  Diagnosis Date   Abnormal CT scan, colon 05/21/2015   Anginal pain (HCC)    CAD S/P percutaneous coronary angioplasty 03/19/2013   s/p 2.25 mm x 15 mm (2.46) DES to mid AV Groove Circumflex   CKD (chronic kidney disease)    CVA (cerebral infarction)    Residual R leg weakness   Diabetes mellitus type 2 in obese    Fibroid    GERD (gastroesophageal reflux disease)    Hyperlipidemia LDL goal <70    Hypertension    Nonsustained ventricular tachycardia (HCC)    found on exam monitoring, asymptomatic    Obesity    Orthopnea    PVC's (premature ventricular contractions)    seen on telemetry during cardiac rehabilitation   Shortness of breath dyspnea    due to brilinta   ST elevation myocardial infarction (STEMI) of inferolateral wall, subsequent episode of care (HCC) 03/19/2013   s/p DES to mid AV Groove Circumflex; EF 50% on cath   Stroke [facility])    2001    Past Surgical History:  Procedure Laterality Date   ABDOMINAL HYSTERECTOMY     CATARACT EXTRACTION W/PHACO Right 09/05/2014   Procedure: CATARACT EXTRACTION PHACO AND INTRAOCULAR LENS PLACEMENT (IOC);  Surgeon: Galen Manila, MD;  Location: [facility] ORS;  Service: Ophthalmology;  Laterality: Right;  [place]: 01:12.2 AP%: 20.1 CDE: 14.45 Lot # [id number]   CHOLECYSTECTOMY     COLONOSCOPY WITH PROPOFOL N/A 07/02/2015   Procedure: COLONOSCOPY WITH PROPOFOL;  Surgeon: Scot Jun, MD;  [facility] ENDOSCOPY;  Service: Endoscopy;  Laterality: N/A;   COLONOSCOPY WITH PROPOFOL N/A 06/27/2020   Procedure: COLONOSCOPY WITH PROPOFOL;  Surgeon: Earline Mayotte, MD;  Location: [facility] ENDOSCOPY;  Service: Endoscopy;  Laterality: N/A;  DM, TAKES PLAVIX   CORONARY STENT INTERVENTION N/A 12/28/2020   Procedure: CORONARY STENT INTERVENTION;  Surgeon: Iran Ouch, MD;  Location: [facility] INVASIVE CV LAB;  Service: Cardiovascular;  Laterality: N/A;   LEFT HEART CATH AND CORONARY ANGIOGRAPHY N/A 12/28/2020   Procedure: LEFT HEART CATH AND CORONARY ANGIOGRAPHY possible percutaneous intervention;  Surgeon: Iran Ouch, MD;  Location: [facility] INVASIVE CV LAB;  Service: Cardiovascular;  Laterality: N/A;   LEFT HEART CATHETERIZATION WITH CORONARY ANGIOGRAM N/A 03/19/2013   Procedure: LEFT HEART CATHETERIZATION WITH CORONARY ANGIOGRAM;  Surgeon: Runell Gess, MD;  Location: [facility] CATH LAB;  Service: Cardiovascular;  Laterality: N/A;   PERCUTANEOUS CORONARY STENT INTERVENTION (PCI-S)  03/19/2013   PCI to mCx - 90% lesion: 2.25 mm x 15 mm Xience Alpine  DES (2.46 mm post-dilation); EF ~50%    FAMHx:  Family History  Problem Relation Age of Onset   Cancer Mother    Diabetes Mother    Hypertension Mother    Hypertension Father    Cancer Father    Diabetes Father    Hypertension Sister    Diabetes Sister    Heart failure Brother    Diabetes Brother    Heart attack Maternal Grandfather    Breast cancer Neg Hx     SOCHx:   reports that she has never smoked. She has never used smokeless tobacco. She reports that she does not drink alcohol and does not use drugs.  ALLERGIES:  Allergies  Allergen Reactions   Aggrenox [Aspirin-Dipyridamole Er] Other (See Comments), Nausea Only and Nausea And Vomiting    Severe headache   Carvedilol Other (See Comments)    caused fatigue    MEDS:  Current Meds  Medication Sig   amLODipine (NORVASC) 10 MG tablet Take 1 tablet by mouth daily.   aspirin 81 MG tablet Take 81 mg by mouth daily.   atorvastatin (LIPITOR) 80 MG tablet Take 1 tablet (80 mg total) by mouth daily at 6 PM.   Blood Glucose Monitoring Suppl (ONE TOUCH ULTRA SYSTEM KIT) W/DEVICE KIT 1 kit by Does not apply route once.   chlorthalidone (HYGROTON) 25 MG tablet Take 1 tablet (25 mg total) by mouth daily.   Cholecalciferol (VITAMIN D) 50 MCG (2000 UT) CAPS Take 6,000 Units by mouth daily.   clopidogrel (PLAVIX) 75 MG tablet Take 75 mg by mouth daily.   Evolocumab (REPATHA SURECLICK) 140 MG/ML SOAJ Inject 140 mg into the skin every 14 (fourteen) days.   ezetimibe (ZETIA) 10 MG tablet TAKE 1 TABLET EVERY DAY   lisinopril (ZESTRIL) 40 MG tablet Take 1 tablet (40 mg total) by mouth daily.   metoprolol succinate (TOPROL-XL) 25 MG 24 hr tablet Take 25 mg by mouth daily.   nitroGLYCERIN (NITROSTAT) 0.4 MG SL tablet Place 1 tablet (0.4 mg total) under the tongue every 5 (five) minutes x 3 doses as needed for chest pain.     ROS: Pertinent items noted in HPI and remainder of comprehensive ROS otherwise negative.  Labs/Other Tests and  Data Reviewed:    Recent Labs: 09/09/2022: BUN 24; Creatinine, Ser 1.33; Potassium 4.0; Sodium 142   Recent Lipid Panel Lab Results  Component Value Date/Time   CHOL 133 03/01/2021 01:03 PM  CHOL 138 03/01/2021 01:03 PM   TRIG 113 03/01/2021 01:03 PM   TRIG 114 03/01/2021 01:03 PM   HDL 48 03/01/2021 01:03 PM   HDL 52 03/01/2021 01:03 PM   CHOLHDL 2.8 03/01/2021 01:03 PM   CHOLHDL 2.7 03/01/2021 01:03 PM   CHOLHDL 2.9 06/17/2013 02:03 PM   LDLCALC 65 03/01/2021 01:03 PM   LDLCALC 65 03/01/2021 01:03 PM    Wt Readings from Last 3 Encounters:  06/27/22 216 lb 6.4 oz (98.2 kg)  01/09/22 210 lb 3.2 oz (95.3 kg)  09/18/21 215 lb (97.5 kg)     Exam:    Vital Signs:  There were no vitals taken for this visit.   General appearance: alert and no distress Lungs: No visual respiratory difficulty Abdomen: Obese Extremities: extremities normal, atraumatic, no cyanosis or edema Skin: Skin color, texture, turgor normal. No rashes or lesions Neurologic: Grossly normal  ASSESSMENT & PLAN:    Recent MI with PCI to the circumflex at the site of the edge of a previously placed stent (12/2020) Mixed dyslipidemia Elevated LP(a) at 172 nmol/L Coronary artery disease status post STEMI in 2015, status post PCI to the circumflex Type 2 diabetes Hypertension Morbid obesity  Ms. Shaw has well treated cholesterol currently and we will plan no changes to her medications at this time.  She can follow-up with me in a year.  I did inform her of the ongoing African heart study.  She might be a candidate for that.  I will provide her with some information.  COVID-19 Education: The signs and symptoms of COVID-19 were discussed with the patient and how to seek care for testing (follow up with PCP or arrange E-visit).  The importance of social distancing was discussed today.  Patient Risk:   After full review of this patients clinical status, I feel that they are at least moderate risk at this  time.  Time:   Today, I have spent 15 minutes with the patient with telehealth technology discussing dyslipidemia, target LDL cholesterol, medication management..     Medication Adjustments/Labs and Tests Ordered: Current medicines are reviewed at length with the patient today.  Concerns regarding medicines are outlined above.   Tests Ordered: Orders Placed This Encounter  Procedures   NMR, lipoprofile    Medication Changes: No orders of the defined types were placed in this encounter.   Disposition:  in 1 year(s)  Chrystie Nose, MD, [facility], FACP  [place]  [facility]  Medical Director of the Advanced Lipid Disorders &  Cardiovascular Risk Reduction Clinic Diplomate of the American Board of Clinical Lipidology Attending Cardiologist  Direct Dial: [phone]  Fax: [phone]  Website:  [email or web address]  Chrystie Nose, MD  09/18/2022 10:19 AM

## 2022-09-21 NOTE — Progress Notes (Unsigned)
Patient ID: [name]                 DOB: 16-Dec-1950                      MRN: [id number]      HPI: Yvonne Shaw is a 72 y.o. female referred by Dr. Allyson Sabal  to HTN clinic. PMH is significant forh/o CVA, HTN, HLD and obesity who was admitted to [facility] on 03/19/13 with an inferior STEMI. 12/27/2020 non-STEMI.  Her blood pressures have been trending up and her primary care doctor has adjusted her lisinopril hydrochlorothiazide 20/25 to lisinopril 40 mg.   Patient saw Dr. Allyson Sabal on 06/18/2022 her BP was elevated, patient was advised to keep log of BP readings at home and to see PharmD in 4 weeks. At last visit with PharmD BP was controlled so no medications were changed. At last visit with PharmD patient hydrochlorothiazide was changed to chlorthalidone. BMP was WNL.  Today patient presented with home BP monitor. We validated it it reads within 10 points compared to office BP monitor. Patient reports her home BP improved and it stays in 142-145/76-84 range and heart rate 50-55 range. Chlorthalidone taking water out, she feels good. Denies dizziness, swelling,SOB, chest pain or palpitation. Patient is interested in losing weight she has been trying to lower carb intake and does chair exercises 3 times per week. She hs not achieved any meaningful weight loss with these lifestyle intervention.We discuss GLP1 for weight loss. Given hx if MI, Reg[facility] Lutes might be covered. We will assess for coverage     Current HTN meds: amlodipine 10 mg daily, lisinopril 40 mg daily, metoprolol ER 25 mg daily, chlorthalidone 25 mg daily.  Previously tried: carvedilol- fatigue BP goal: <130/80  Family History:  Relation Problem Comments  Mother (Deceased) Cancer   Diabetes   Hypertension     Father (Deceased) Cancer   Diabetes   Hypertension     Sister Metallurgist) Diabetes   Hypertension     Brother (Deceased) Diabetes   Heart failure     Maternal Grandmother (Deceased)   Maternal Grandfather (Deceased) Heart  attack     Paternal Grandmother (Deceased)   Paternal Grandfather (Deceased)     Social History:  Alcohol: none Smoking: never   Diet: low salt diet, does not cook food with salt  Eat out once or twice week   Exercise: 3 times a week - chair yoga    Home BP readings: 142-145/76-84  heart rate 50-55  Wt Readings from Last 3 Encounters:  06/27/22 216 lb 6.4 oz (98.2 kg)  01/09/22 210 lb 3.2 oz (95.3 kg)  09/18/21 215 lb (97.5 kg)   BP Readings from Last 3 Encounters:  09/22/22 (!) 141/79  08/18/22 (!) 151/73  07/24/22 132/74   Pulse Readings from Last 3 Encounters:  09/22/22 (!) 50  08/18/22 (!) 54  07/24/22 (!) 56    Renal function: CrCl cannot be calculated (Unknown ideal weight.).  Past Medical History:  Diagnosis Date   Abnormal CT scan, colon 05/21/2015   Anginal pain (HCC)    CAD S/P percutaneous coronary angioplasty 03/19/2013   s/p 2.25 mm x 15 mm (2.46) DES to mid AV Groove Circumflex   CKD (chronic kidney disease)    CVA (cerebral infarction)    Residual R leg weakness   Diabetes mellitus type 2 in obese    Fibroid    GERD (gastroesophageal reflux disease)  Hyperlipidemia LDL goal <70    Hypertension    Nonsustained ventricular tachycardia (HCC)    found on exam monitoring, asymptomatic   Obesity    Orthopnea    PVC's (premature ventricular contractions)    seen on telemetry during cardiac rehabilitation   Shortness of breath dyspnea    due to brilinta   ST elevation myocardial infarction (STEMI) of inferolateral wall, subsequent episode of care (HCC) 03/19/2013   s/p DES to mid AV Groove Circumflex; EF 50% on cath   Stroke [facility])    2001    Current Outpatient Medications on File Prior to Visit  Medication Sig Dispense Refill   amLODipine (NORVASC) 10 MG tablet Take 1 tablet by mouth daily.     aspirin 81 MG tablet Take 81 mg by mouth daily.     atorvastatin (LIPITOR) 80 MG tablet Take 1 tablet (80 mg total) by mouth daily at 6 PM. 30  tablet 5   Blood Glucose Monitoring Suppl (ONE TOUCH ULTRA SYSTEM KIT) W/DEVICE KIT 1 kit by Does not apply route once. 1 each 0   Cetirizine HCl 10 MG CAPS Take by mouth.     chlorthalidone (HYGROTON) 25 MG tablet Take 1 tablet (25 mg total) by mouth daily. 90 tablet 3   Cholecalciferol (VITAMIN D) 50 MCG (2000 UT) CAPS Take 6,000 Units by mouth daily.     clopidogrel (PLAVIX) 75 MG tablet Take 75 mg by mouth daily.     Evolocumab (REPATHA SURECLICK) 140 MG/ML SOAJ Inject 140 mg into the skin every 14 (fourteen) days. 6 mL 3   ezetimibe (ZETIA) 10 MG tablet TAKE 1 TABLET EVERY DAY 90 tablet 3   lisinopril (ZESTRIL) 40 MG tablet Take 1 tablet (40 mg total) by mouth daily.     metoprolol succinate (TOPROL-XL) 25 MG 24 hr tablet Take 25 mg by mouth daily.     nitroGLYCERIN (NITROSTAT) 0.4 MG SL tablet Place 1 tablet (0.4 mg total) under the tongue every 5 (five) minutes x 3 doses as needed for chest pain. 25 tablet 3   No current facility-administered medications on file prior to visit.    Allergies  Allergen Reactions   Aggrenox [Aspirin-Dipyridamole Er] Other (See Comments), Nausea Only and Nausea And Vomiting    Severe headache   Carvedilol Other (See Comments)    caused fatigue    Blood pressure (!) 141/79, pulse (!) 50, SpO2 100%.   Assessment/Plan:  1. Hypertension -  HTN (hypertension) Assessment: In office BP 151/69 upon repeating 141/79  heart rate 50 (goal <130/80) Home BP on manual cuff ~ 142-145/76-84 range and heart rate 50-55 range  Tolerates current  BP medications well without any side effects Denies SOB, palpitation, chest pain, headaches,or swelling Follows low salt diet and exercise 3 times per week    Plan:  Patient has tried carvedilol in the past it caused fatigue but in agreement to try it again  Will switch metoprolol ER 25 mg daily to carvedilol 12.5 mg twice daily  Continue taking amlodipine 10 mg daily, lisinopril 40 mg daily, chlorthalidone 25 mg  daily  Patient to keep record of BP readings with heart rate and report to [place] at the next visit Patient to see PharmD in 4 weeks for follow up  Follow up lab(s):none   Obesity Assessment/Plan:  Baseline Weight 216 lbs BMI 34.94 kg/m2. Hx of MI. Patient has not met goal of at least 5% of body weight loss with comprehensive lifestyle modifications alone in the  past 3-6 months.  Pharmacotherapy is appropriate to pursue as augmentation. Will start [facility] coverage assessment  Confirmed patient has no personal or family history of medullary thyroid carcinoma (MTC) or Multiple Endocrine Neoplasia syndrome type 2 (MEN 2)  Injection technique reviewed at today's visit Advised patient on common side effects including nausea, diarrhea, dyspepsia, decreased appetite, and fatigue Counseled patient on reducing meal size and how to titrate medication to minimize side effects  Counseled patient to call if intolerable side effects or if experiencing dehydration, abdominal pain, or dizziness Patient will adhere to dietary modifications and will target at least 150 minutes of moderate intensity exercise weekly    Thank you  Carmela Hurt, Pharm.D [facility] [address] [place], [place] [address]  Phone: [phone]; Fax: [phone]

## 2022-09-22 ENCOUNTER — Other Ambulatory Visit (HOSPITAL_COMMUNITY): Payer: Self-pay

## 2022-09-22 ENCOUNTER — Telehealth: Payer: Self-pay | Admitting: Pharmacy Technician

## 2022-09-22 ENCOUNTER — Telehealth: Payer: Self-pay | Admitting: Pharmacist

## 2022-09-22 ENCOUNTER — Ambulatory Visit: Payer: Medicare HMO | Attending: Cardiology | Admitting: Student

## 2022-09-22 ENCOUNTER — Encounter: Payer: Self-pay | Admitting: Student

## 2022-09-22 VITALS — BP 141/79 | HR 50 | Ht 66.0 in | Wt 216.0 lb

## 2022-09-22 DIAGNOSIS — I10 Essential (primary) hypertension: Secondary | ICD-10-CM

## 2022-09-22 DIAGNOSIS — E669 Obesity, unspecified: Secondary | ICD-10-CM | POA: Insufficient documentation

## 2022-09-22 MED ORDER — CARVEDILOL 12.5 MG PO TABS: 12.5000 mg | ORAL_TABLET | Freq: Two times a day (BID) | ORAL | 3 refills | Status: DC

## 2022-09-22 NOTE — Assessment & Plan Note (Addendum)
Assessment/Plan:  Baseline Weight 216 lbs BMI 34.94 kg/m2. Hx of MI. Patient has not met goal of at least 5% of body weight loss with comprehensive lifestyle modifications alone in the past 3-6 months.  Pharmacotherapy is appropriate to pursue as augmentation. Will start [facility] coverage assessment  Confirmed patient has no personal or family history of medullary thyroid carcinoma (MTC) or Multiple Endocrine Neoplasia syndrome type 2 (MEN 2)  Injection technique reviewed at today's visit Advised patient on common side effects including nausea, diarrhea, dyspepsia, decreased appetite, and fatigue Counseled patient on reducing meal size and how to titrate medication to minimize side effects  Counseled patient to call if intolerable side effects or if experiencing dehydration, abdominal pain, or dizziness Patient will adhere to dietary modifications and will target at least 150 minutes of moderate intensity exercise weekly

## 2022-09-22 NOTE — Patient Instructions (Addendum)
Changes made by your pharmacist Carmela Hurt, PharmD at today's visit:    Instructions/Changes  (what do you need to do) Your Notes  (what you did and when you did it)  Stop taking metoprolol ER 25 mg daily and start taking Carvedilol 12.5 mg twice daily    Continue taking amlodipine 10 mg daily, lisinopril 40 mg daily, chlorthalidone 25 mg daily.    We will check coverage for [facility]     Bring all of your meds, your BP cuff and your record of home blood pressures to your next appointment.    HOW TO TAKE YOUR BLOOD PRESSURE AT HOME  Rest 5 minutes before taking your blood pressure.  Don't smoke or drink caffeinated beverages for at least 30 minutes before. Take your blood pressure before (not after) you eat. Sit comfortably with your back supported and both feet on the floor (don't cross your legs). Elevate your arm to heart level on a table or a desk. Use the proper sized cuff. It should fit smoothly and snugly around your bare upper arm. There should be enough room to slip a fingertip under the cuff. The bottom edge of the cuff should be 1 inch above the crease of the elbow. Ideally, take 3 measurements at one sitting and record the average.  Important lifestyle changes to control high blood pressure  Intervention  Effect on the BP  Lose extra pounds and watch your waistline Weight loss is one of the most effective lifestyle changes for controlling blood pressure. If you're overweight or obese, losing even a small amount of weight can help reduce blood pressure. Blood pressure might go down by about 1 millimeter of mercury (mm Hg) with each kilogram (about 2.2 pounds) of weight lost.  Exercise regularly As a general goal, aim for at least 30 minutes of moderate physical activity every day. Regular physical activity can lower high blood pressure by about 5 to 8 mm Hg.  Eat a healthy diet Eating a diet rich in whole grains, fruits, vegetables, and low-fat dairy products and low in  saturated fat and cholesterol. A healthy diet can lower high blood pressure by up to 11 mm Hg.  Reduce salt (sodium) in your diet Even a small reduction of sodium in the diet can improve heart health and reduce high blood pressure by about 5 to 6 mm Hg.  Limit alcohol One drink equals 12 ounces of beer, 5 ounces of wine, or 1.5 ounces of 80-proof liquor.  Limiting alcohol to less than one drink a day for women or two drinks a day for men can help lower blood pressure by about 4 mm Hg.   If you have any questions or concerns please use My Chart to send questions or call the office at [phone]  GLP1 Agonist Titration Plan:  Will plan to follow the titration plan as below, pending patient is tolerating each dose before increasing to the next. Can slow titration if needed for tolerability.    Wegovy:  -Month 1: Inject Wegovy 0.25 mg once weekly x 4 weeks -Month 2: Inject Wegovy 0.5 mg once weekly x 4 weeks -Month 3: Inject Wegovy 1 mg once weekly x 4 weeks -Month 4: Inject [id number] 1.7mg  SQ once weekly x 4 weeks -Month 5+: Inject Wegovy 2.4mg  SQ once weekly

## 2022-09-22 NOTE — Telephone Encounter (Signed)
PA request has been Submitted. New Encounter created for follow up. For additional info see Pharmacy Prior Auth telephone encounter from 09/22/22.

## 2022-09-22 NOTE — Telephone Encounter (Signed)
Pharmacy Patient Advocate Encounter   Received notification from Pt Calls Messages that prior authorization for wegovy is required/requested.   Insurance verification completed.   The patient is insured through [employer] .   Per test claim: PA required; PA submitted to [facility] via CoverMyMeds Key/confirmation #/EOC [id number] Status is pending

## 2022-09-22 NOTE — Assessment & Plan Note (Signed)
Assessment: In office BP 151/69 upon repeating 141/79  heart rate 50 (goal <130/80) Home BP on manual cuff ~ 142-145/76-84 range and heart rate 50-55 range  Tolerates current  BP medications well without any side effects Denies SOB, palpitation, chest pain, headaches,or swelling Follows low salt diet and exercise 3 times per week    Plan:  Patient has tried carvedilol in the past it caused fatigue but in agreement to try it again  Will switch metoprolol ER 25 mg daily to carvedilol 12.5 mg twice daily  Continue taking amlodipine 10 mg daily, lisinopril 40 mg daily, chlorthalidone 25 mg daily  Patient to keep record of BP readings with heart rate and report to [place] at the next visit Patient to see PharmD in 4 weeks for follow up  Follow up lab(s):none

## 2022-09-23 ENCOUNTER — Other Ambulatory Visit (HOSPITAL_COMMUNITY): Payer: Self-pay

## 2022-09-23 ENCOUNTER — Telehealth: Payer: Self-pay | Admitting: Cardiovascular Disease

## 2022-09-23 NOTE — Telephone Encounter (Signed)
Kurtis from registration will reprint receipt ad place in the mail

## 2022-09-23 NOTE — Telephone Encounter (Signed)
Patient would like copy of the receipt from yesterday to be mailed to her. Please advise

## 2022-09-24 ENCOUNTER — Other Ambulatory Visit (HOSPITAL_COMMUNITY): Payer: Self-pay

## 2022-09-29 ENCOUNTER — Other Ambulatory Visit (HOSPITAL_COMMUNITY): Payer: Self-pay

## 2022-09-29 NOTE — Telephone Encounter (Signed)
Pharmacy Patient Advocate Encounter  Received notification from [facility] that Prior Authorization for wegovy has been DENIED.  Full denial letter will be uploaded to the media tab. See denial reason below.   PA #/Case ID/Reference #: [id number]

## 2022-09-29 NOTE — Telephone Encounter (Signed)
Patient reported she was on Ozempic but it was cost prohibitive due to her being in coverage gap. Will consider restarting Ozempic Jan 2025.

## 2022-10-03 NOTE — Progress Notes (Unsigned)
Cardiology Clinic Note   Patient Name: SHIFRA SWARTZENTRUBER Date of Encounter: 10/06/2022  Primary Care Provider:  Dan Humphreys, MD Primary Cardiologist:  Nanetta Batty, MD  Patient Profile    Cristy Friedlander 72 year old female presents to the clinic today for follow-up evaluation of her coronary artery disease and hypertension.  Past Medical History    Past Medical History:  Diagnosis Date   Abnormal CT scan, colon 05/21/2015   Anginal pain (HCC)    CAD S/P percutaneous coronary angioplasty 03/19/2013   s/p 2.25 mm x 15 mm (2.46) DES to mid AV Groove Circumflex   CKD (chronic kidney disease)    CVA (cerebral infarction)    Residual R leg weakness   Diabetes mellitus type 2 in obese    Fibroid    GERD (gastroesophageal reflux disease)    Hyperlipidemia LDL goal <70    Hypertension    Nonsustained ventricular tachycardia (HCC)    found on exam monitoring, asymptomatic   Obesity    Orthopnea    PVC's (premature ventricular contractions)    seen on telemetry during cardiac rehabilitation   Shortness of breath dyspnea    due to brilinta   ST elevation myocardial infarction (STEMI) of inferolateral wall, subsequent episode of care (HCC) 03/19/2013   s/p DES to mid AV Groove Circumflex; EF 50% on cath   Stroke [facility])    2001   Past Surgical History:  Procedure Laterality Date   ABDOMINAL HYSTERECTOMY     CATARACT EXTRACTION W/PHACO Right 09/05/2014   Procedure: CATARACT EXTRACTION PHACO AND INTRAOCULAR LENS PLACEMENT (IOC);  Surgeon: Galen Manila, MD;  Location: [facility] ORS;  Service: Ophthalmology;  Laterality: Right;  [place]: 01:12.2 AP%: 20.1 CDE: 14.45 Lot # [id number]   CHOLECYSTECTOMY     COLONOSCOPY WITH PROPOFOL N/A 07/02/2015   Procedure: COLONOSCOPY WITH PROPOFOL;  Surgeon: Scot Jun, MD;  Location: [facility] ENDOSCOPY;  Service: Endoscopy;  Laterality: N/A;   COLONOSCOPY WITH PROPOFOL N/A 06/27/2020   Procedure: COLONOSCOPY WITH PROPOFOL;  Surgeon: Earline Mayotte, MD;  Location: [facility] ENDOSCOPY;  Service: Endoscopy;  Laterality: N/A;  DM, TAKES PLAVIX   CORONARY STENT INTERVENTION N/A 12/28/2020   Procedure: CORONARY STENT INTERVENTION;  Surgeon: Iran Ouch, MD;  Location: [facility] INVASIVE CV LAB;  Service: Cardiovascular;  Laterality: N/A;   LEFT HEART CATH AND CORONARY ANGIOGRAPHY N/A 12/28/2020   Procedure: LEFT HEART CATH AND CORONARY ANGIOGRAPHY possible percutaneous intervention;  Surgeon: Iran Ouch, MD;  Location: [facility] INVASIVE CV LAB;  Service: Cardiovascular;  Laterality: N/A;   LEFT HEART CATHETERIZATION WITH CORONARY ANGIOGRAM N/A 03/19/2013   Procedure: LEFT HEART CATHETERIZATION WITH CORONARY ANGIOGRAM;  Surgeon: Runell Gess, MD;  Location: [facility] CATH LAB;  Service: Cardiovascular;  Laterality: N/A;   PERCUTANEOUS CORONARY STENT INTERVENTION (PCI-S)  03/19/2013   PCI to mCx - 90% lesion: 2.25 mm x 15 mm Xience Alpine DES (2.46 mm post-dilation); EF ~50%    Allergies  Allergies  Allergen Reactions   Aggrenox [Aspirin-Dipyridamole Er] Other (See Comments), Nausea Only and Nausea And Vomiting    Severe headache   Carvedilol Other (See Comments)    caused fatigue    History of Present Illness    TARNESHA ULLOA has a PMH of CVA, HTN, HLD, CAD, obesity and is a retired Engineer, civil (consulting) from [employer].  She retired in 2020.  She was admitted to [facility] 03/19/2013 with inferior STEMI.  She was in her usual state of health until  9 PM the previous night.  She began to experience intermittent substernal burning with no radiation or associated symptoms.  She was concerned for indigestion.  She ended up going to sleep and woke up at 6 AM with the same discomfort which had increased to 9 out of 10 in intensity.  She reported associated nausea.  She tried to do activities around her house but her discomfort continued.  She notified her husband and called 911.  Her EKG showed fluctuating ST segment elevation in leads III and aVF.  She was  transported to [facility] ED.  Code STEMI was called.  She received aspirin and a heparin bolus.  She was diverted to [facility] emergency department.  Her cardiac catheterization showed high-grade mid AV groove circumflex stenosis which was stented with DES.  She wore a cardiac event monitor after having frequent PVCs in cardiac rehab.  It showed short runs of NSVT.  After 1 year her Brilinta was transitioned to Plavix due to her shortness of breath.  She underwent repeat cardiac catheterization 12/27/2020 in the setting of chest pain with NSTEMI.  She was noted to have patent mid AV groove circumflex with high-grade lesion just proximal to the previously stented area.  She underwent restenting.  She was noted to have nonobstructive CAD and normal LV function.  She followed up with Dr. Allyson Sabal on 05/08/2021.  During that time she denied chest pain and shortness of breath.  Her aspirin and Plavix were continued.  She had been seeing Dr. Rennis Golden in the lipid clinic and was continued on atorvastatin, ezetimibe, aspirin, Repatha.  She presented to the clinic 01/09/22 for follow-up evaluation and stated she felt well .  She reported that she was rushing around that morning and did not take her blood pressure medication. Blood pressure initially was 152/72 and on recheck was 128/64.  She continued to follow a heart healthy diet and be physically active with doing exercises 3-4 times per week.  She reported that she had lost around 40 pounds intentionally.  We reviewed her previous cardiac catheterizations and stent placements.  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  She planned to have her lipids drawn towards the end of the month and follow-up with Dr. Rennis Golden in lipid clinic.  I asked her maintain her physical activity, current diet and current medications.  She followed up with Dr. Allyson Sabal 06/27/2022.  She reported a an episode of nitro responsive chest pain the weekend before.  She denied further episodes  of chest discomfort.  Her blood pressure was noted to be 158/70.  She was instructed to keep a blood pressure log.  Her primary care physician had adjusted her lisinopril medication previously.  She followed up with Dr. Rennis Golden virtually on 09/18/2022.  Her LP(a) had been evaluated and was noted to be 172.  It was felt that she had probably had a 20-30% reduction in her LP(a) with Repatha therapy.  LP(a) had not previously been checked.  Her LDL was noted to be 65 and HDL was 59 with triglycerides of 71.  Her ezetimibe, atorvastatin, and Repatha were continued.  She presents to the clinic today for follow-up evaluation and states she is doing well.  She has not been as physically active.  She has been working with her primary care physician on her blood pressure.  Her blood pressure today is initially 98/50 and on recheck is 100s over 50s.  She denies lightheadedness and dizziness.  I encouraged her  to increase her physical activity, continue to monitor her blood pressure and we will plan follow-up in 9 to 12 months.  She worked at [employer].  Today she denies chest pain, shortness of breath, lower extremity edema, fatigue, palpitations, melena, hematuria, hemoptysis, diaphoresis, weakness, presyncope, syncope, orthopnea, and PND.     Home Medications    Prior to Admission medications   Medication Sig Start Date End Date Taking? Authorizing Provider  amLODipine (NORVASC) 10 MG tablet Take 1 tablet by mouth daily. 09/15/18   [provider]  aspirin 81 MG tablet Take 81 mg by mouth daily.    [provider]  atorvastatin (LIPITOR) 80 MG tablet Take 1 tablet (80 mg total) by mouth daily at 6 PM. 08/10/13   Allayne Butcher, PA-C  azelastine (ASTELIN) 0.1 % nasal spray Place 1 spray into both nostrils 2 (two) times daily. 08/15/21   [provider]  Blood Glucose Monitoring Suppl (ONE TOUCH ULTRA SYSTEM KIT) W/DEVICE KIT 1 kit by Does not apply route once. 03/21/13   Robbie Lis M, PA-C  Cetirizine HCl 10 MG CAPS Take by mouth. 08/15/21 08/15/22  [provider]  Cholecalciferol (VITAMIN D) 50 MCG (2000 UT) CAPS Take 6,000 Units by mouth daily.    [provider]  Evolocumab (REPATHA SURECLICK) 140 MG/ML SOAJ Inject 1 Dose into the skin every 14 (fourteen) days. 09/18/21   Chrystie Nose, MD  ezetimibe (ZETIA) 10 MG tablet TAKE 1 TABLET EVERY DAY 03/15/21   Hilty, Lisette Abu, MD  lisinopril (PRINIVIL,ZESTRIL) 20 MG tablet Take 20 mg by mouth daily.    [provider]  metoprolol succinate (TOPROL-XL) 25 MG 24 hr tablet Take 25 mg by mouth daily.    [provider]  nitroGLYCERIN (NITROSTAT) 0.4 MG SL tablet Place 1 tablet (0.4 mg total) under the tongue every 5 (five) minutes x 3 doses as needed for chest pain. 01/08/21   Monge, Petra Kuba, NP  OZEMPIC, 0.25 OR 0.5 MG/DOSE, 2 MG/3ML SOPN Inject into the skin. 08/17/21   [provider]    Family History    Family History  Problem Relation Age of Onset   Cancer Mother    Diabetes Mother    Hypertension Mother    Hypertension Father    Cancer Father    Diabetes Father    Hypertension Sister    Diabetes Sister    Heart failure Brother    Diabetes Brother    Heart attack Maternal Grandfather    Breast cancer Neg Hx    She indicated that her mother is deceased. She indicated that her father is deceased. She indicated that her sister is alive. She indicated that her brother is deceased. She indicated that her maternal grandmother is deceased. She indicated that her maternal grandfather is deceased. She indicated that her paternal grandmother is deceased. She indicated that her paternal grandfather is deceased. She indicated that the status of her neg hx is unknown.  Social History    Social History   Socioeconomic History   Marital status: Married    Spouse name: Not on file   Number of children: Not on file   Years of education: Not on file   Highest education  level: Not on file  Occupational History   Not on file  Tobacco Use   Smoking status: Never   Smokeless tobacco: Never  Vaping Use   Vaping status: Never Used  Substance and Sexual Activity   Alcohol use: No  Drug use: No   Sexual activity: Not on file  Other Topics Concern   Not on file  Social History Narrative   Married, 2 healthy children. Lives in [place], [place]. Works at [employer].   Social Determinants of Health   Financial Resource Strain: Low Risk  (04/28/2022)   Received from [facility], [facility]   Overall Financial Resource Strain (CARDIA)    Difficulty of Paying Living Expenses: Not hard at all  Food Insecurity: No Food Insecurity (04/28/2022)   Received from [facility], [facility]   Hunger Vital Sign    Worried About Running Out of Food in the Last Year: Never true    Ran Out of Food in the Last Year: Never true  Transportation Needs: No Transportation Needs (04/28/2022)   Received from [facility], [facility]   [facility] - Transportation    In the past 12 months, has lack of transportation kept you from medical appointments or from getting medications?: No    Lack of Transportation (Non-Medical): No  Physical Activity: Not on file  Stress: Not on file  Social Connections: Not on file  Intimate Partner Violence: Not on file     Review of Systems    General:  No chills, fever, night sweats or weight changes.  Cardiovascular:  No chest pain, dyspnea on exertion, edema, orthopnea, palpitations, paroxysmal nocturnal dyspnea. Dermatological: No rash, lesions/masses Respiratory: No cough, dyspnea Urologic: No hematuria, dysuria Abdominal:   No nausea, vomiting, diarrhea, bright red blood per rectum, melena, or hematemesis Neurologic:  No visual changes, wkns, changes in mental status. All other systems reviewed and are otherwise negative except as noted  above.  Physical Exam    VS:  BP (!) 98/50   Pulse 60   Ht 5\' 6"  (1.676 m)   Wt 217 lb 6.4 oz (98.6 kg)   SpO2 96%   BMI 35.09 kg/m  , BMI Body mass index is 35.09 kg/m. GEN: Well nourished, well developed, in no acute distress. HEENT: normal. Neck: Supple, no JVD, carotid bruits, or masses. Cardiac: RRR, no murmurs, rubs, or gallops. No clubbing, cyanosis, edema.  Radials/DP/PT 2+ and equal bilaterally.  Respiratory:  Respirations regular and unlabored, clear to auscultation bilaterally. GI: Soft, nontender, nondistended, BS + x 4. MS: no deformity or atrophy. Skin: warm and dry, no rash. Neuro:  Strength and sensation are intact. Psych: Normal affect.  Accessory Clinical Findings    Recent Labs: 09/09/2022: BUN 24; Creatinine, Ser 1.33; Potassium 4.0; Sodium 142   Recent Lipid Panel    Component Value Date/Time   CHOL 133 03/01/2021 1303   CHOL 138 03/01/2021 1303   TRIG 113 03/01/2021 1303   TRIG 114 03/01/2021 1303   HDL 48 03/01/2021 1303   HDL 52 03/01/2021 1303   CHOLHDL 2.8 03/01/2021 1303   CHOLHDL 2.7 03/01/2021 1303   CHOLHDL 2.9 06/17/2013 1403   VLDL 16 06/17/2013 1403   LDLCALC 65 03/01/2021 1303   LDLCALC 65 03/01/2021 1303         ECG personally reviewed by me today-none today.  EKG 01/09/2022 sinus bradycardia with possible premature atrial complexes with aberrant conduction 51 bpm  Echocardiogram 12/28/2020 IMPRESSIONS     1. Left ventricular ejection fraction, by estimation, is 60 to 65%. The  left ventricle has normal function. The left ventricle has no regional  wall motion abnormalities. There is mild left ventricular hypertrophy.  Left ventricular diastolic  parameters  are consistent with Grade I diastolic dysfunction (impaired relaxation).   2. Right ventricular systolic function is normal. The right ventricular  size is normal.   3. The mitral valve is normal in structure. No evidence of mitral valve  regurgitation. No evidence of  mitral stenosis.   4. The aortic valve was not well visualized. Aortic valve regurgitation  is not visualized. No aortic stenosis is present.   5. The inferior vena cava is normal in size with greater than 50%  respiratory variability, suggesting right atrial pressure of 3 mmHg.   FINDINGS   Left Ventricle: Left ventricular ejection fraction, by estimation, is 60  to 65%. The left ventricle has normal function. The left ventricle has no  regional wall motion abnormalities. The left ventricular internal cavity  size was normal in size. There is   mild left ventricular hypertrophy. Left ventricular diastolic parameters  are consistent with Grade I diastolic dysfunction (impaired relaxation).   Right Ventricle: The right ventricular size is normal. No increase in  right ventricular wall thickness. Right ventricular systolic function is  normal.   Left Atrium: Left atrial size was normal in size.   Right Atrium: Right atrial size was normal in size.   Pericardium: There is no evidence of pericardial effusion.   Mitral Valve: The mitral valve is normal in structure. No evidence of  mitral valve regurgitation. No evidence of mitral valve stenosis. MV peak  gradient, 4.2 mmHg. The mean mitral valve gradient is 1.0 mmHg.   Tricuspid Valve: The tricuspid valve is normal in structure. Tricuspid  valve regurgitation is not demonstrated. No evidence of tricuspid  stenosis.   Aortic Valve: The aortic valve was not well visualized. Aortic valve  regurgitation is not visualized. No aortic stenosis is present. Aortic  valve mean gradient measures 3.0 mmHg. Aortic valve peak gradient measures  4.8 mmHg. Aortic valve area, by VTI  measures 2.05 cm.   Pulmonic Valve: The pulmonic valve was normal in structure. Pulmonic valve  regurgitation is not visualized. No evidence of pulmonic stenosis.   Aorta: The aortic root is normal in size and structure.   Venous: The inferior vena cava is normal  in size with greater than 50%  respiratory variability, suggesting right atrial pressure of 3 mmHg.   IAS/Shunts: No atrial level shunt detected by color flow Doppler.   Assessment & Plan   1.  Essential hypertension-BP today 98/50 Maintain blood pressure log Continue carvedilol, lisinopril, amlodipine, chlorthalidone Heart healthy low-sodium diet-salty 6 given Increase physical activity as tolerated Follows with PCP  Coronary artery disease-denies recent episodes of chest discomfort.  With cardiac catheterization in the setting of STEMI 2015 and underwent repeat cardiac catheterization 12/22.  She underwent stenting distal to her previous stent (mid AV groove circumflex). Continue ezetimibe, Repatha, atorvastatin, carvedilol, sublingual nitroglycerin as needed Heart healthy low-sodium diet Maintain physical activity  Hyperlipidemia-LDL 65 on 03/01/21 Continue ezetimibe, atorvastatin, Repatha, aspirin Heart healthy low-sodium high-fiber diet I follows with l Dr. Rennis Golden  Disposition: Follow-up with Dr.Berry or me in 9-12 months.  Thomasene Ripple. Royce Stegman NP-C     10/06/2022, 10:58 AM [facility] [address] Office [phone] Fax [phone]    I spent 13 minutes examining this patient, reviewing medications, and using patient centered shared decision making involving her cardiac care.  Prior to her visit I spent greater than 20 minutes reviewing her past medical history,  medications, and prior cardiac tests.

## 2022-10-06 ENCOUNTER — Ambulatory Visit: Payer: Medicare HMO | Attending: General Practice | Admitting: General Practice

## 2022-10-06 ENCOUNTER — Encounter: Payer: Self-pay | Admitting: General Practice

## 2022-10-06 VITALS — BP 98/50 | HR 60 | Ht 66.0 in | Wt 217.4 lb

## 2022-10-06 DIAGNOSIS — Z9861 Coronary angioplasty status: Secondary | ICD-10-CM

## 2022-10-06 DIAGNOSIS — E785 Hyperlipidemia, unspecified: Secondary | ICD-10-CM | POA: Diagnosis not present

## 2022-10-06 DIAGNOSIS — I10 Essential (primary) hypertension: Secondary | ICD-10-CM | POA: Diagnosis not present

## 2022-10-06 DIAGNOSIS — I2510 Atherosclerotic heart disease of native coronary artery without angina pectoris: Secondary | ICD-10-CM | POA: Diagnosis not present

## 2022-10-06 NOTE — Patient Instructions (Signed)
Medication Instructions:  The current medical regimen is effective;  continue present plan and medications as directed. Please refer to the Current Medication list given to you today.  *If you need a refill on your cardiac medications before your next appointment, please call your pharmacy*  Lab Work: NONE If you have labs (blood work) drawn today and your tests are completely normal, you will receive your results only by: MyChart Message (if you have MyChart) OR A paper copy in the mail If you have any lab test that is abnormal or we need to change your treatment, we will call you to review the results.  Other Instructions TAKE AND LOG YOUR BLOOD PRESSURE INCREASE HYDRATION INCREASE PHYSICAL ACTIVITY-AS TOLERATED  Follow-Up: At [facility], you and your health needs are our priority.  As part of our continuing mission to provide you with exceptional heart care, we have created designated Provider Care Teams.  These Care Teams include your primary Cardiologist (physician) and Advanced Practice Providers (APPs -  Physician Assistants and Nurse Practitioners) who all work together to provide you with the care you need, when you need it.  Your next appointment:   9-12 month(s)  Provider:   Nanetta Batty, MD  or Edd Fabian, FNP

## 2022-10-15 VITALS — BP 117/58 | HR 61 | Temp 98.6°F | Resp 18 | Ht 66.0 in | Wt 217.0 lb

## 2022-10-15 DIAGNOSIS — Z006 Encounter for examination for normal comparison and control in clinical research program: Secondary | ICD-10-CM

## 2022-10-15 NOTE — Research (Addendum)
AA HEART  Informed Consent   Subject Name: Yvonne Shaw  Subject met inclusion and exclusion criteria.  The informed consent form, study requirements and expectations were reviewed with the subject and questions and concerns were addressed prior to the signing of the consent form.  The subject verbalized understanding of the trial requirements.  The subject agreed to participate in the AA HEART  trial and signed the informed consent at 10:09 AM  on 10-15-2022.  The informed consent was obtained prior to performance of any protocol-specific procedures for the subject.  A copy of the signed informed consent was given to the subject and a copy was placed in the subject's medical record.   [place] Demya Scruggs, Research Coordinator    Are there any labs that are clinically significant?  Yes []  OR No[]   IACCESSION NO. [id number]                                             Page 1 of 1                                                        INVESTIGATOR: ([id number])                          PROTOCOL   [id number]                     Thomasene Ripple, M.D.                              INVESTIGATOR NO.: [id number]                     c/o [facility]                         SUBJECT NUMBER: [id number]                     [place]                 SUBJECT INITIALS NOT COLLECTED:                     [address]                          VISIT: D0                     [place], [place]                   SPONSOR REPORT TO:                 COLLECTION TIME:10:54 DATE:15-Oct-2022                     Cruzita Lederer                 DATE RECEIVED IN LABORATORY: 16-Oct-2022  c/o Sponsor(or Addtl) [facility].Study DATE REPORTED BY LABORATORY: 16-Oct-2022                     [facility]                          SEX: F  AGE: 72y                     [id number]                   [place], [place] [phone]                                                                                         Ref. Ranges               Clinical    Comments                                                                          Significance                                                                            Yes*  No                    HEMATOLOGY&DIFFERENTIAL PANEL                      HGB            13.5         11.5-15.8 g/dL                      HCT            39           34-48 %                      RBC            4.6          3.9-5.5 x106/uL                      MCH            29           26-34 pg                      MCHC  35           31-38 g/dL                      RDW            14.2         12.0-15.0 %                      RBC Morph      No Review Required                        MCV            85           80-100 fL                      WBC            6.21         3.80-10.70 x103/uL                      Neutrophil     4.29         1.96-7.23 x103/uL                      Lymphocyte     1.51         0.80-3.00 x103/uL                      Monocytes      0.25         0.12-0.92 x103/uL                      Eosinophil     0.11         0.00-0.57 x103/uL                      Basophils      0.05         0.00-0.20 x103/uL                      Neutrophil     69.1         40.5-75.0 %                      Lymphocyte     24.2         15.4-48.5%                      Monocytes      4.1          2.6-10.1 %                      Eosinophil     1.8          0.0-6.8 %                      Basophils      0.8          0.0-2.0 %                      Platelets      230          130-394 x103/uL ANC  ANC            4.29         1.96-7.23 x103/uL                    HBA1C                      Fast HbA1c     6.2          <6.5%  RETICULOCYTES                      Retic %        1.6          0.6-2.5 %                      Retic Abs      0.074        0.030-0.120 x106/uL

## 2022-10-20 ENCOUNTER — Encounter: Payer: Self-pay | Admitting: Student

## 2022-10-20 ENCOUNTER — Ambulatory Visit: Payer: Medicare HMO | Attending: Cardiology | Admitting: Student

## 2022-10-20 VITALS — BP 124/70 | HR 60 | Ht 66.0 in | Wt 214.0 lb

## 2022-10-20 DIAGNOSIS — I10 Essential (primary) hypertension: Secondary | ICD-10-CM | POA: Diagnosis not present

## 2022-10-20 NOTE — Patient Instructions (Signed)
No changes to the current BP medications. Continue with your great work on diet and regular exercise.   Carmela Hurt, Pharm.D [facility] [address] [place], [place] [address]  Phone: [phone]; Fax: [phone]

## 2022-10-20 NOTE — Progress Notes (Signed)
Patient ID: [name]                 DOB: [birth year]                      MRN: [id number]      HPI: Yvonne Shaw is a 72 y.o. female referred by Dr. Allyson Shaw  to HTN clinic. PMH is significant forh/o CVA, HTN, HLD and obesity who was admitted to [facility] on 03/19/13 with an inferior STEMI. 12/27/2020 non-STEMI.  Her blood pressures have been trending up and her primary care doctor has adjusted her lisinopril hydrochlorothiazide 20/25 to lisinopril 40 mg.   Patient saw Dr. Allyson Shaw on 06/18/2022 her BP was elevated, patient was advised to keep log of BP readings at home and to see PharmD in 4 weeks. At last visit with PharmD BP was controlled so no medications were changed. At last visit with PharmD patient hydrochlorothiazide was changed to chlorthalidone. BMP was WNL. At the last visit with me her home BP and office BP was elevated so metoprolol was switched to carvedilol and patient saw Yvonne Shaw on 09/30 in office BP was at goal.   Patient presented today for follow up. Her home BP readings are at goal. Denies dizziness, palpitation or swelling. Reports she tolerates carvedilol this time better than the last. She wants to loose 14 lbs by new year with healthy eating and regular exercise. She started walking 2-3 times per week and does chair yoga everyday. She has cut down on carb mainly bread and processed food.    Current HTN meds: amlodipine 10 mg daily, lisinopril 40 mg daily, carvedilol 12.5 mg twice daily, chlorthalidone 25 mg daily.  Previously tried: none BP goal: <130/80  Family History:  Relation Problem Comments  Mother (Deceased) Cancer   Diabetes   Hypertension     Father (Deceased) Cancer   Diabetes   Hypertension     Sister Metallurgist) Diabetes   Hypertension     Brother (Deceased) Diabetes   Heart failure     Maternal Grandmother (Deceased)   Maternal Grandfather (Deceased) Heart attack     Paternal Grandmother (Deceased)   Paternal Grandfather (Deceased)      Social History:  Alcohol: none Smoking: never   Diet: low salt diet, does not cook food with salt  Eat out once or twice week  Cutting out bread, processed meat   Exercise: 3 times a week - chair yoga  Walking -30 min 2-3 times per week    Home BP readings:  SBP DBP HR  128 67 49  126 68 51  122 67 52  129 72 59  135 78 55  121 68 61  123 75 59  105 64 68  129  72 58  115 63 56  100 50 60  126 71 62  118 60 58  133 68 63  112 62 63  124 71 67  133 69  59  127 70 60  122.5556 67.5 58.88889    Wt Readings from Last 3 Encounters:  10/20/22 214 lb (97.1 kg)  10/15/22 217 lb (98.4 kg)  10/06/22 217 lb 6.4 oz (98.6 kg)   BP Readings from Last 3 Encounters:  10/20/22 124/70  10/15/22 (!) 117/58  10/06/22 (!) 98/50   Pulse Readings from Last 3 Encounters:  10/20/22 60  10/15/22 61  10/06/22 60    Renal function: CrCl cannot be calculated (Patient's most recent lab result is  older than the maximum 21 days allowed.).  Past Medical History:  Diagnosis Date   Abnormal CT scan, colon 05/21/2015   Anginal pain (HCC)    CAD S/P percutaneous coronary angioplasty 03/19/2013   s/p 2.25 mm x 15 mm (2.46) DES to mid AV Groove Circumflex   CKD (chronic kidney disease)    CVA (cerebral infarction)    Residual R leg weakness   Diabetes mellitus type 2 in obese    Fibroid    GERD (gastroesophageal reflux disease)    Hyperlipidemia LDL goal <70    Hypertension    Nonsustained ventricular tachycardia (HCC)    found on exam monitoring, asymptomatic   Obesity    Orthopnea    PVC's (premature ventricular contractions)    seen on telemetry during cardiac rehabilitation   Shortness of breath dyspnea    due to brilinta   ST elevation myocardial infarction (STEMI) of inferolateral wall, subsequent episode of care (HCC) 03/19/2013   s/p DES to mid AV Groove Circumflex; EF 50% on cath   Stroke [facility])    2001    Current Outpatient Medications on File Prior to Visit   Medication Sig Dispense Refill   amLODipine (NORVASC) 10 MG tablet Take 1 tablet by mouth daily.     aspirin 81 MG tablet Take 81 mg by mouth daily.     atorvastatin (LIPITOR) 80 MG tablet Take 1 tablet (80 mg total) by mouth daily at 6 PM. 30 tablet 5   Blood Glucose Monitoring Suppl (ONE TOUCH ULTRA SYSTEM KIT) W/DEVICE KIT 1 kit by Does not apply route once. 1 each 0   carvedilol (COREG) 12.5 MG tablet Take 1 tablet (12.5 mg total) by mouth 2 (two) times daily. 180 tablet 3   chlorthalidone (HYGROTON) 25 MG tablet Take 1 tablet (25 mg total) by mouth daily. 90 tablet 3   Cholecalciferol (VITAMIN D) 50 MCG (2000 UT) CAPS Take 6,000 Units by mouth daily.     clopidogrel (PLAVIX) 75 MG tablet Take 75 mg by mouth daily.     Evolocumab (REPATHA SURECLICK) 140 MG/ML SOAJ Inject 140 mg into the skin every 14 (fourteen) days. 6 mL 3   ezetimibe (ZETIA) 10 MG tablet TAKE 1 TABLET EVERY DAY 90 tablet 3   lisinopril (ZESTRIL) 40 MG tablet Take 1 tablet (40 mg total) by mouth daily.     nitroGLYCERIN (NITROSTAT) 0.4 MG SL tablet Place 1 tablet (0.4 mg total) under the tongue every 5 (five) minutes x 3 doses as needed for chest pain. 25 tablet 3   Cetirizine HCl 10 MG CAPS Take by mouth.     No current facility-administered medications on file prior to visit.    Allergies  Allergen Reactions   Aggrenox [Aspirin-Dipyridamole Er] Other (See Comments), Nausea Only and Nausea And Vomiting    Severe headache   Carvedilol Other (See Comments)    caused fatigue    Blood pressure 124/70, pulse 60, height 5\' 6"  (1.676 m), weight 214 lb (97.1 kg).   Assessment/Plan:  1. Hypertension -  HTN (hypertension) Assessment: In office BP 124/70 heart rate 60 (goal <130/80) Home BP on manual cuff 123/68 heart rate 58 Tolerates current  BP medications well without any side effects Denies SOB, palpitation, chest pain, headaches,or swelling Follows low salt diet and exercise - chair exercise every day and  walking for 30 min  2-3 times per week   Plan:  Given at goal BP at home and in office no changes to  current BP medications  Continue taking carvedilol 12.5 mg twice daily amlodipine 10 mg daily, lisinopril 40 mg daily, chlorthalidone 25 mg daily  Patient to keep record of BP readings with heart rate and report to [place] at the next visit Patient to see PharmD in 12 weeks for follow up  Follow up lab(s):none   Thank you  Carmela Hurt, Pharm.D [facility] A Division of [facility] [address] [place], [place] [address]  Phone: [phone]; Fax: [phone]

## 2022-10-20 NOTE — Assessment & Plan Note (Addendum)
Assessment: In office BP 124/70 heart rate 60 (goal <130/80) Home BP on manual cuff 123/68 heart rate 58 Tolerates current  BP medications well without any side effects Denies SOB, palpitation, chest pain, headaches,or swelling Follows low salt diet and exercise - chair exercise every day and walking for 30 min  2-3 times per week   Plan:  Given at goal BP at home and in office no changes to current BP medications  Continue taking carvedilol 12.5 mg twice daily amlodipine 10 mg daily, lisinopril 40 mg daily, chlorthalidone 25 mg daily  Patient to keep record of BP readings with heart rate and report to [place] at the next visit Patient to see PharmD in 12 weeks for follow up  Follow up lab(s):none

## 2022-10-22 ENCOUNTER — Other Ambulatory Visit: Payer: Self-pay | Admitting: Physician Assistant

## 2022-10-22 DIAGNOSIS — Z1231 Encounter for screening mammogram for malignant neoplasm of breast: Secondary | ICD-10-CM

## 2022-11-14 ENCOUNTER — Ambulatory Visit
Admission: RE | Admit: 2022-11-14 | Discharge: 2022-11-14 | Disposition: A | Discharge status: Home / Self Care | Payer: Medicare HMO | Source: Ambulatory Visit | Attending: Physician Assistant | Admitting: Physician Assistant

## 2022-11-14 DIAGNOSIS — Z1231 Encounter for screening mammogram for malignant neoplasm of breast: Secondary | ICD-10-CM | POA: Diagnosis present

## 2023-01-20 ENCOUNTER — Ambulatory Visit: Payer: Medicare HMO

## 2023-01-20 NOTE — Progress Notes (Deleted)
 Patient ID: Yvonne Shaw                 DOB: 1950/09/20                      MRN: [id number]      HPI: Yvonne Shaw is a 73 y.o. female referred by Dr. Court  to HTN clinic. PMH is significant forh/o CVA, HTN, HLD and obesity who was admitted to [facility] on 03/19/13 with an inferior STEMI. 12/27/2020 non-STEMI.  Her blood pressures have been trending up and her primary care doctor has adjusted her lisinopril  hydrochlorothiazide  20/25 to lisinopril  40 mg.   Patient saw Dr. Court on 06/18/2022 her BP was elevated, patient was advised to keep log of BP readings at home and to see PharmD in 4 weeks.. At visit 08/18/22 with PharmD patient hydrochlorothiazide  was changed to chlorthalidone . BMP was WNL. At visit 09/22/22  her home BP and office BP was elevated so metoprolol  was switched to carvedilol  and patient saw Yvonne Shaw on 09/30. In office BP was at goal. Last seen in the office 10/20/22. BP was 124/70 and her home readings were at goal as well.  Exercise? Weight loss? Home BP? HR? Dizziness, lightheadedness, headache, blurred vision, SOB, swelling   Patient presented today for follow up. Her home BP readings are at goal. Denies dizziness, palpitation or swelling. Reports she tolerates carvedilol  this time better than the last. She wants to loose 14 lbs by new year with healthy eating and regular exercise. She started walking 2-3 times per week and does chair yoga everyday. She has cut down on carb mainly bread and processed food.    Current HTN meds: amlodipine  10 mg daily, lisinopril  40 mg daily, carvedilol  12.5 mg twice daily, chlorthalidone  25 mg daily.  Previously tried: none BP goal: <130/80  Family History:  Relation Problem Comments  Mother (Deceased) Cancer   Diabetes   Hypertension     Father (Deceased) Cancer   Diabetes   Hypertension     Sister Metallurgist) Diabetes   Hypertension     Brother (Deceased) Diabetes   Heart failure     Maternal Grandmother (Deceased)    Maternal Grandfather (Deceased) Heart attack     Paternal Grandmother (Deceased)   Paternal Grandfather (Deceased)     Social History:  Alcohol: none Smoking: never   Diet: low salt diet, does not cook food with salt  Eat out once or twice week  Cutting out bread, processed meat   Exercise: 3 times a week - chair yoga  Walking -30 min 2-3 times per week    Home BP readings:    Wt Readings from Last 3 Encounters:  10/20/22 214 lb (97.1 kg)  10/15/22 217 lb (98.4 kg)  10/06/22 217 lb 6.4 oz (98.6 kg)   BP Readings from Last 3 Encounters:  10/20/22 124/70  10/15/22 (!) 117/58  10/06/22 (!) 98/50   Pulse Readings from Last 3 Encounters:  10/20/22 60  10/15/22 61  10/06/22 60    Renal function: CrCl cannot be calculated (Patient's most recent lab result is older than the maximum 21 days allowed.).  Past Medical History:  Diagnosis Date   Abnormal CT scan, colon 05/21/2015   Anginal pain (HCC)    CAD S/P percutaneous coronary angioplasty 03/19/2013   s/p 2.25 mm x 15 mm (2.46) DES to mid AV Groove Circumflex   CKD (chronic kidney disease)    CVA (cerebral infarction)  Residual R leg weakness   Diabetes mellitus type 2 in obese    Fibroid    GERD (gastroesophageal reflux disease)    Hyperlipidemia LDL goal <70    Hypertension    Nonsustained ventricular tachycardia (HCC)    found on exam monitoring, asymptomatic   Obesity    Orthopnea    PVC's (premature ventricular contractions)    seen on telemetry during cardiac rehabilitation   Shortness of breath dyspnea    due to brilinta    ST elevation myocardial infarction (STEMI) of inferolateral wall, subsequent episode of care (HCC) 03/19/2013   s/p DES to mid AV Groove Circumflex; EF 50% on cath   Stroke [facility])    2001    Current Outpatient Medications on File Prior to Visit  Medication Sig Dispense Refill   amLODipine  (NORVASC ) 10 MG tablet Take 1 tablet by mouth daily.     aspirin  81 MG tablet Take 81  mg by mouth daily.     atorvastatin  (LIPITOR ) 80 MG tablet Take 1 tablet (80 mg total) by mouth daily at 6 PM. 30 tablet 5   Blood Glucose Monitoring Suppl (ONE TOUCH ULTRA SYSTEM KIT) W/DEVICE KIT 1 kit by Does not apply route once. 1 each 0   carvedilol  (COREG ) 12.5 MG tablet Take 1 tablet (12.5 mg total) by mouth 2 (two) times daily. 180 tablet 3   Cetirizine HCl 10 MG CAPS Take by mouth.     chlorthalidone  (HYGROTON ) 25 MG tablet Take 1 tablet (25 mg total) by mouth daily. 90 tablet 3   Cholecalciferol (VITAMIN D) 50 MCG (2000 UT) CAPS Take 6,000 Units by mouth daily.     clopidogrel  (PLAVIX ) 75 MG tablet Take 75 mg by mouth daily.     Evolocumab  (REPATHA  SURECLICK) 140 MG/ML SOAJ Inject 140 mg into the skin every 14 (fourteen) days. 6 mL 3   ezetimibe  (ZETIA ) 10 MG tablet TAKE 1 TABLET EVERY DAY 90 tablet 3   lisinopril  (ZESTRIL ) 40 MG tablet Take 1 tablet (40 mg total) by mouth daily.     nitroGLYCERIN  (NITROSTAT ) 0.4 MG SL tablet Place 1 tablet (0.4 mg total) under the tongue every 5 (five) minutes x 3 doses as needed for chest pain. 25 tablet 3   No current facility-administered medications on file prior to visit.    Allergies  Allergen Reactions   Aggrenox [Aspirin -Dipyridamole Er] Other (See Comments), Nausea Only and Nausea And Vomiting    Severe headache   Carvedilol  Other (See Comments)    caused fatigue    There were no vitals taken for this visit.   Assessment/Plan:  1. Hypertension -  No problem-specific Assessment & Plan notes found for this encounter.   Thank you  Trey Bebee D Emila Steinhauser, Pharm.D, BCACP, CPP [facility] A Division of  [facility] [address] [place], [place] [address]  Phone: [phone]; Fax: [phone]

## 2023-02-17 ENCOUNTER — Ambulatory Visit: Admission: RE | Admit: 2023-02-17 | Payer: Medicare HMO | Source: Home / Self Care | Admitting: Ophthalmology

## 2023-02-17 ENCOUNTER — Encounter: Admission: RE | Payer: Self-pay | Source: Home / Self Care

## 2023-02-17 SURGERY — PHACOEMULSIFICATION, CATARACT, WITH IOL INSERTION
Anesthesia: Topical | Laterality: Left

## 2023-02-27 ENCOUNTER — Ambulatory Visit: Payer: Medicare HMO

## 2023-06-23 ENCOUNTER — Inpatient Hospital Stay: Attending: Physician Assistant

## 2023-06-23 NOTE — Progress Notes (Signed)
 [facility] Healthcare Advance Directives Clinical Social Work  Telesia presented to [employer]  to review and complete healthcare advance directives.  Clinical Social Worker met with Lucy and her husband, Yvonne Shaw, who is the patient.  Gregary Lean designated Hyla Maillard as their primary healthcare agent and Chriss Redel as their secondary agent.  She also completed healthcare living will.    Documents were notarized and copies made for patient/family. Clinical Social Worker will send documents to medical records to be scanned into patient's chart. Clinical Social Worker encouraged Gregary Lean and Richard to contact with any additional questions or concerns.   Kennth Peal, LCSW Clinical Social Worker [facility]

## 2023-07-20 ENCOUNTER — Encounter (HOSPITAL_BASED_OUTPATIENT_CLINIC_OR_DEPARTMENT_OTHER): Payer: Self-pay

## 2023-07-20 ENCOUNTER — Other Ambulatory Visit (HOSPITAL_BASED_OUTPATIENT_CLINIC_OR_DEPARTMENT_OTHER): Payer: Self-pay

## 2023-07-20 DIAGNOSIS — E785 Hyperlipidemia, unspecified: Secondary | ICD-10-CM

## 2023-09-16 ENCOUNTER — Encounter: Payer: Self-pay | Admitting: Internal Medicine

## 2023-09-17 ENCOUNTER — Encounter: Payer: Self-pay | Admitting: Cardiovascular Disease

## 2023-10-13 ENCOUNTER — Other Ambulatory Visit: Payer: Self-pay | Admitting: Physician Assistant

## 2023-10-13 DIAGNOSIS — Z1231 Encounter for screening mammogram for malignant neoplasm of breast: Secondary | ICD-10-CM

## 2023-11-16 ENCOUNTER — Ambulatory Visit
Admission: RE | Admit: 2023-11-16 | Discharge: 2023-11-16 | Disposition: A | Discharge status: Home / Self Care | Source: Ambulatory Visit | Attending: Physician Assistant | Admitting: Physician Assistant

## 2023-11-16 DIAGNOSIS — Z1231 Encounter for screening mammogram for malignant neoplasm of breast: Secondary | ICD-10-CM | POA: Insufficient documentation

## 2023-12-05 ENCOUNTER — Other Ambulatory Visit: Payer: Self-pay | Admitting: Cardiovascular Disease

## 2024-01-20 ENCOUNTER — Ambulatory Visit: Admitting: Cardiovascular Disease

## 2024-02-02 NOTE — Progress Notes (Unsigned)
 Patient ID: Yvonne Shaw                 DOB: May 10, 1950                      MRN: [id number]      HPI: Yvonne Shaw is a 74 y.o. female referred by Dr. Court  to HTN clinic. PMH is significant forh/o CVA, HTN, HLD and obesity who was admitted to [facility] on 03/19/13 with an inferior STEMI. 12/27/2020 non-STEMI.  Patient is here for a follow up. At last visit in Oct 2024, home BP readings were at goal and she was tolerating all medications well. In the past she reported fatigue to carvedilol  but she is tolerating it much better this time. No medication changes were made.   In the past patients BP have trended upward over time, initially lisinopril /HCTZ 20/25mg  was switched to lisinopril  40 mg by PCP. Following elevated BP's on 09/22/2022 visit, hydrochlorothiazide  was switched to chlorthalidone  and metoprolol  was switched to carvedilol . BP was at goal at the 10/06/23 visit with J. Cleaver.   Current HTN meds: amlodipine  10 mg daily, lisinopril  40 mg daily, carvedilol  12.5 mg twice daily, chlorthalidone  25 mg daily.  Previously tried: Metoprolol  (inadequate response), hydrochlorothiazide  (inadequate response) BP goal: <130/80  Family History:  Relation Problem Comments  Mother (Deceased) Cancer   Diabetes   Hypertension     Father (Deceased) Cancer   Diabetes   Hypertension     Sister Metallurgist) Diabetes   Hypertension     Brother (Deceased) Diabetes   Heart failure     Maternal Grandmother (Deceased)   Maternal Grandfather (Deceased) Heart attack     Paternal Grandmother (Deceased)   Paternal Grandfather (Deceased)     Social History:  Alcohol: none Smoking: never   Diet: low salt diet, does not cook food with salt  Eat out once or twice week  Cutting out bread, processed meat   Exercise: 3 times a week - chair yoga  Walking -30 min 2-3 times per week    Home BP readings:  SBP DBP HR  128 67 49  126 68 51  122 67 52  129 72 59  135 78 55  121 68 61  123 75 59   105 64 68  129  72 58  115 63 56  100 50 60  126 71 62  118 60 58  133 68 63  112 62 63  124 71 67  133 69  59  127 70 60  122.5556 67.5 58.88889    Wt Readings from Last 3 Encounters:  10/20/22 214 lb (97.1 kg)  10/15/22 217 lb (98.4 kg)  10/06/22 217 lb 6.4 oz (98.6 kg)   BP Readings from Last 3 Encounters:  10/20/22 124/70  10/15/22 (!) 117/58  10/06/22 (!) 98/50   Pulse Readings from Last 3 Encounters:  10/20/22 60  10/15/22 61  10/06/22 60    Renal function: CrCl cannot be calculated (Patient's most recent lab result is older than the maximum 21 days allowed.).  Past Medical History:  Diagnosis Date   Abnormal CT scan, colon 05/21/2015   Anginal pain    CAD S/P percutaneous coronary angioplasty 03/19/2013   s/p 2.25 mm x 15 mm (2.46) DES to mid AV Groove Circumflex   CKD (chronic kidney disease)    CVA (cerebral infarction)    Residual R leg weakness   Diabetes mellitus type 2 in obese  Fibroid    GERD (gastroesophageal reflux disease)    Hyperlipidemia LDL goal <70    Hypertension    Nonsustained ventricular tachycardia (HCC)    found on exam monitoring, asymptomatic   Obesity    Orthopnea    PVC's (premature ventricular contractions)    seen on telemetry during cardiac rehabilitation   Shortness of breath dyspnea    due to brilinta    ST elevation myocardial infarction (STEMI) of inferolateral wall, subsequent episode of care (HCC) 03/19/2013   s/p DES to mid AV Groove Circumflex; EF 50% on cath   Stroke [facility])    2001    Current Outpatient Medications on File Prior to Visit  Medication Sig Dispense Refill   amLODipine  (NORVASC ) 10 MG tablet Take 1 tablet by mouth daily.     aspirin  81 MG tablet Take 81 mg by mouth daily.     atorvastatin  (LIPITOR ) 80 MG tablet Take 1 tablet (80 mg total) by mouth daily at 6 PM. 30 tablet 5   Blood Glucose Monitoring Suppl (ONE TOUCH ULTRA SYSTEM KIT) W/DEVICE KIT 1 kit by Does not apply route once. 1 each 0    carvedilol  (COREG ) 12.5 MG tablet TAKE 1 TABLET(12.5 MG) BY MOUTH TWICE DAILY 60 tablet 0   Cetirizine HCl 10 MG CAPS Take by mouth.     chlorthalidone  (HYGROTON ) 25 MG tablet Take 1 tablet (25 mg total) by mouth daily. 90 tablet 3   Cholecalciferol (VITAMIN D) 50 MCG (2000 UT) CAPS Take 6,000 Units by mouth daily.     clopidogrel  (PLAVIX ) 75 MG tablet Take 75 mg by mouth daily.     Evolocumab  (REPATHA  SURECLICK) 140 MG/ML SOAJ Inject 140 mg into the skin every 14 (fourteen) days. 6 mL 3   ezetimibe  (ZETIA ) 10 MG tablet TAKE 1 TABLET EVERY DAY 90 tablet 3   lisinopril  (ZESTRIL ) 40 MG tablet Take 1 tablet (40 mg total) by mouth daily.     nitroGLYCERIN  (NITROSTAT ) 0.4 MG SL tablet Place 1 tablet (0.4 mg total) under the tongue every 5 (five) minutes x 3 doses as needed for chest pain. 25 tablet 3   No current facility-administered medications on file prior to visit.    Allergies  Allergen Reactions   Aggrenox [Aspirin -Dipyridamole Er] Other (See Comments), Nausea Only and Nausea And Vomiting    Severe headache   Carvedilol  Other (See Comments)    caused fatigue    There were no vitals taken for this visit.   Assessment/Plan:  1. Hypertension -  No problem-specific Assessment & Plan notes found for this encounter. Assessment: BP is un/controlled in office BP ***/ *** mmHg above the goal (<130/80).  Tolerates ***well without any side effects  Denies SOB, palpitation, chest pain, headaches,or swelling  Reiterated the importance of regular exercise and low salt diet   Plan:  Start taking Continue taking  Patient to keep record of BP readings with heart rate and report to us  at the next visit Patient to see PharmD in *** weeks for follow up  Follow up lab(s)    Thank you,  Nalaya Wojdyla, PharmD Candidate   Robbi Blanch, Pharm.D [facility] A Division of [facility] [address], [place], [place] [address]  Phone: [phone]; Fax: [phone]

## 2024-02-03 ENCOUNTER — Ambulatory Visit: Admitting: Pharmacist

## 2024-02-10 ENCOUNTER — Ambulatory Visit: Admitting: Cardiovascular Disease

## 2024-03-09 ENCOUNTER — Ambulatory Visit: Admitting: Cardiovascular Disease

## 2024-03-16 ENCOUNTER — Ambulatory Visit: Admitting: Pharmacist
